# Patient Record
Sex: Female | Born: 1965 | Race: Black or African American | Hispanic: No | Smoking: Current every day smoker
Health system: Southern US, Community
[De-identification: ages and names within clinical notes are randomized; demographics above are authoritative.]

## PROBLEM LIST (undated history)

## (undated) DIAGNOSIS — J45909 Unspecified asthma, uncomplicated: Secondary | ICD-10-CM

## (undated) DIAGNOSIS — I10 Essential (primary) hypertension: Secondary | ICD-10-CM

## (undated) DIAGNOSIS — N390 Urinary tract infection, site not specified: Secondary | ICD-10-CM

## (undated) HISTORY — DX: Urinary tract infection, site not specified: N39.0

## (undated) HISTORY — DX: Essential (primary) hypertension: I10

## (undated) HISTORY — PX: ABDOMINAL HYSTERECTOMY: SHX81

---

## 2005-05-30 ENCOUNTER — Emergency Department: Payer: Self-pay | Admitting: Emergency Medicine

## 2005-05-30 ENCOUNTER — Other Ambulatory Visit: Payer: Self-pay

## 2005-05-31 ENCOUNTER — Ambulatory Visit: Payer: Self-pay | Admitting: Emergency Medicine

## 2006-07-08 ENCOUNTER — Ambulatory Visit: Payer: Self-pay | Admitting: Family Medicine

## 2007-06-23 ENCOUNTER — Emergency Department: Payer: Self-pay | Admitting: Emergency Medicine

## 2009-07-18 ENCOUNTER — Ambulatory Visit: Payer: Self-pay | Admitting: Urology

## 2009-07-18 ENCOUNTER — Ambulatory Visit: Payer: Self-pay | Admitting: Cardiology

## 2011-01-31 ENCOUNTER — Encounter: Payer: Self-pay | Admitting: Internal Medicine

## 2011-05-24 ENCOUNTER — Encounter: Payer: Self-pay | Admitting: Internal Medicine

## 2011-05-24 ENCOUNTER — Ambulatory Visit (INDEPENDENT_AMBULATORY_CARE_PROVIDER_SITE_OTHER): Payer: 59 | Admitting: Internal Medicine

## 2011-05-24 VITALS — BP 112/66 | HR 81 | Temp 98.9°F | Wt 174.0 lb

## 2011-05-24 DIAGNOSIS — L309 Dermatitis, unspecified: Secondary | ICD-10-CM

## 2011-05-24 DIAGNOSIS — J40 Bronchitis, not specified as acute or chronic: Secondary | ICD-10-CM

## 2011-05-24 DIAGNOSIS — I10 Essential (primary) hypertension: Secondary | ICD-10-CM | POA: Insufficient documentation

## 2011-05-24 DIAGNOSIS — L259 Unspecified contact dermatitis, unspecified cause: Secondary | ICD-10-CM

## 2011-05-24 MED ORDER — TRIAMTERENE-HCTZ 37.5-25 MG PO CAPS: 1.0000 | ORAL_CAPSULE | ORAL | Status: DC

## 2011-05-24 MED ORDER — PREDNISONE (PAK) 10 MG PO TABS: ORAL_TABLET | ORAL | Status: DC

## 2011-05-24 MED ORDER — AMMONIUM LACTATE 12 % EX CREA: TOPICAL_CREAM | CUTANEOUS | Status: DC | PRN

## 2011-05-24 MED ORDER — AZITHROMYCIN 250 MG PO TABS: ORAL_TABLET | ORAL | Status: AC

## 2011-05-24 MED ORDER — ALBUTEROL SULFATE HFA 108 (90 BASE) MCG/ACT IN AERS: 2.0000 | INHALATION_SPRAY | Freq: Four times a day (QID) | RESPIRATORY_TRACT | Status: DC | PRN

## 2011-05-24 MED ORDER — TRIAMCINOLONE ACETONIDE 0.025 % EX OINT: TOPICAL_OINTMENT | Freq: Two times a day (BID) | CUTANEOUS | Status: DC

## 2011-05-24 NOTE — Patient Instructions (Signed)
Use LacHydrin twice daily. Use Triamcinolone 1-2 times per day for next 3 days. Limit use as this medication can cause lightening of the skin.

## 2011-05-24 NOTE — Progress Notes (Addendum)
Subjective:    Patient ID: Jaclyn Lin, female    DOB: [birth year], 45 y.o.   MRN: [id number]  HPI 45YO female with h/o HTN presents for acute visit. She has two complaints today. First, she notes 2 week h/o peeling on the bottoms of her feet and on her palms. She was seen at urgent care and they diagnosed it as a fungal infection. They recommended topical anti-fungal cream and gave 1 day of diflucan. She had no improvement with this.  She has tried exfoliating her feet and has tried topical over-the-counter creams with no improvement. She denies any exposure to new perfumes or detergents. She denies any fever or chills. She denies any other rash.  Her second complaint today is a 2 day history of shortness of breath and cough. She notes that a coworker has been sick with cough and congestion for several weeks. She reports that she developed a cough productive of clear sputum 2 days ago. She has not been taking any medication for this. She describes some chest pain and tightness across her chest particularly with coughing. She denies any sore throat. She does have some nasal congestion which is clear. She denies any fever or chills.  In regards to her blood pressure, she reports full compliance with her education. She denies any palpitations or headache. She has had chest pain in the setting of her recent cough. She denied bring a record of her blood pressures today.  Outpatient Encounter Prescriptions as of 05/24/2011  Medication Sig Dispense Refill  . triamterene-hydrochlorothiazide (DYAZIDE) 37.5-25 MG per capsule Take 1 each (1 capsule total) by mouth every morning.  90 capsule  1  . DISCONTD: triamterene-hydrochlorothiazide (DYAZIDE) 37.5-25 MG per capsule Take 1 capsule by mouth every morning.        Marland Kitchen albuterol (PROAIR HFA) 108 (90 BASE) MCG/ACT inhaler Inhale 2 puffs into the lungs every 6 (six) hours as needed for wheezing.  18 g  3  . ammonium lactate (LAC-HYDRIN) 12 % cream Apply  topically as needed for dry skin.  385 g  0  . azithromycin (ZITHROMAX Z-PAK) 250 MG tablet Take 2 tablets (500 mg) on  Day 1,  followed by 1 tablet (250 mg) once daily on Days 2 through 5.  6 each  0  . predniSONE (STERAPRED UNI-PAK) 10 MG tablet Take 60mg  day 1 then taper by 10mg  daily  21 tablet  0  . triamcinolone (KENALOG) 0.025 % ointment Apply topically 2 (two) times daily.  30 g  0    Review of Systems  Constitutional: Negative for fever, chills, appetite change, fatigue and unexpected weight change.  HENT: Positive for congestion, sneezing and sinus pressure. Negative for ear pain, sore throat, trouble swallowing, neck pain and voice change.   Eyes: Negative for visual disturbance.  Respiratory: Positive for chest tightness, shortness of breath and wheezing. Negative for cough and stridor.   Cardiovascular: Positive for chest pain. Negative for palpitations and leg swelling.  Gastrointestinal: Negative for nausea, vomiting, abdominal pain, diarrhea, constipation, blood in stool, abdominal distention and anal bleeding.  Genitourinary: Negative for dysuria and flank pain.  Musculoskeletal: Negative for myalgias, arthralgias and gait problem.  Skin: Positive for rash. Negative for color change.  Neurological: Negative for dizziness and headaches.  Hematological: Negative for adenopathy. Does not bruise/bleed easily.  Psychiatric/Behavioral: Negative for suicidal ideas, sleep disturbance and dysphoric mood. The patient is not nervous/anxious.    BP 112/66  Pulse 81  Temp(Src) 98.9 F (37.2 C) (  Oral)  Wt 174 lb (78.926 kg)  SpO2 98%     Objective:   Physical Exam  Constitutional: She is oriented to person, place, and time. She appears well-developed and well-nourished. No distress.  HENT:  Head: Normocephalic and atraumatic.  Right Ear: External ear normal. A middle ear effusion is present.  Left Ear: External ear normal. A middle ear effusion is present.  Nose: Nose normal.    Mouth/Throat: Oropharynx is clear and moist. No oropharyngeal exudate.  Eyes: Conjunctivae are normal. Pupils are equal, round, and reactive to light. Right eye exhibits no discharge. Left eye exhibits no discharge. No scleral icterus.  Neck: Normal range of motion. Neck supple. No tracheal deviation present. No thyromegaly present.  Cardiovascular: Normal rate, regular rhythm, normal heart sounds and intact distal pulses.  Exam reveals no gallop and no friction rub.   No murmur heard. Pulmonary/Chest: Effort normal. No accessory muscle usage. Not tachypneic. No respiratory distress. She has decreased breath sounds (prolonged exp phase). She has wheezes. She has rhonchi. She has no rales. She exhibits no tenderness.  Musculoskeletal: Normal range of motion. She exhibits no edema and no tenderness.  Lymphadenopathy:    She has no cervical adenopathy.  Neurological: She is alert and oriented to person, place, and time. No cranial nerve deficit. She exhibits normal muscle tone. Coordination normal.  Skin: Skin is warm and dry. No rash noted. She is not diaphoretic. No erythema. No pallor.  Psychiatric: She has a normal mood and affect. Her behavior is normal. Judgment and thought content normal.          Assessment & Plan:  1. Bronchitis - symptoms and exam consistent with bronchitis. Will treat with ACE azithromycin and prednisone taper. She will use her albuterol inhaler as needed. She will use Tessalon as needed for cough. She will followup in one week or call sooner if symptoms are not improving. Strongly encouraged her to quit smoking.  2. Dermatitis - peeling on feet and on palms is most consistent with dyshidrotic eczema. Will add triamcinolone for 2-3 days. Will consider LacHydrin if no improvement She will call if symptoms are not improving.  3. Hypertension - blood pressure well-controlled on current medications. Will check renal function with labs prior to next visit. She will  followup in one week.

## 2011-05-28 ENCOUNTER — Telehealth: Payer: Self-pay | Admitting: Internal Medicine

## 2011-05-28 NOTE — Telephone Encounter (Signed)
Spoke w/pt - she found previous RX for cough med and will take then and keep apt Friday

## 2011-05-28 NOTE — Telephone Encounter (Signed)
[phone] Pt would like to know if you would call her in some cough medince.  She is so sore from coughing .  Pt [facility] appointment on Friday [facility] [address]

## 2011-05-31 ENCOUNTER — Encounter: Payer: Self-pay | Admitting: Internal Medicine

## 2011-05-31 ENCOUNTER — Ambulatory Visit (INDEPENDENT_AMBULATORY_CARE_PROVIDER_SITE_OTHER): Payer: 59 | Admitting: Internal Medicine

## 2011-05-31 VITALS — BP 112/72 | HR 81 | Temp 98.1°F | Wt 175.0 lb

## 2011-05-31 DIAGNOSIS — L309 Dermatitis, unspecified: Secondary | ICD-10-CM

## 2011-05-31 DIAGNOSIS — Z0000 Encounter for general adult medical examination without abnormal findings: Secondary | ICD-10-CM

## 2011-05-31 DIAGNOSIS — J40 Bronchitis, not specified as acute or chronic: Secondary | ICD-10-CM

## 2011-05-31 DIAGNOSIS — B373 Candidiasis of vulva and vagina: Secondary | ICD-10-CM

## 2011-05-31 DIAGNOSIS — B3731 Acute candidiasis of vulva and vagina: Secondary | ICD-10-CM

## 2011-05-31 DIAGNOSIS — I10 Essential (primary) hypertension: Secondary | ICD-10-CM

## 2011-05-31 DIAGNOSIS — L259 Unspecified contact dermatitis, unspecified cause: Secondary | ICD-10-CM

## 2011-05-31 MED ORDER — FLUCONAZOLE 150 MG PO TABS: 150.0000 mg | ORAL_TABLET | Freq: Once | ORAL | Status: AC

## 2011-05-31 MED ORDER — TRIAMCINOLONE ACETONIDE 0.025 % EX OINT: TOPICAL_OINTMENT | Freq: Three times a day (TID) | CUTANEOUS | Status: DC

## 2011-05-31 NOTE — Patient Instructions (Signed)
Hand Dermatitis Hand dermatitis (dyshidrotic eczema) is a skin condition in which small, itchy, raised dots or fluid-filled blisters form over the palms of the hands. Outbreaks of hand dermatitis can last 3 to 4 weeks. CAUSES  The cause of hand dermatitis is unknown. However, it occurs most often in patients with a history of allergies such as:  Hay fever.   Allergic asthma.   Allergies to latex.  Chemical exposure, injuries, and environmental irritants can make hand dermatitis worse. Washing your hands too frequently can remove natural oils, which can dry out the skin and contribute to outbreaks of hand dermatitis. SYMPTOMS  The most common symptom of hand dermatitis is intense itching. Cracks or grooves (fissures) on the fingers can also develop. Affected areas can be painful, especially areas where large blisters have formed. DIAGNOSIS Your caregiver can usually tell what the problem is by doing a physical exam. PREVENTION  Avoid excessive hand washing.   Avoid the use of harsh chemicals.   Wear protective gloves when handling products that can irritate your skin.  TREATMENT  Steroid creams and ointments, such as over-the-counter 1% hydrocortisone cream, can reduce inflammation and improve moisture retention. These should be applied at least 2 to 4 times per day. Your caregiver may ask you to use a stronger prescription steroid cream to help speed the healing of blistered and cracked skin. In severe cases, oral steroid medicine may be needed. If you have an infection, antibiotics may be needed. Your caregiver may also prescribe antihistamines. These medicines help reduce itching. HOME CARE INSTRUCTIONS  Only take over-the-counter or prescription medicines as directed by your caregiver.   You may use wet or cold compresses. This can help:   Alleviate itching.   Increase the effectiveness of topical creams.   Minimize blisters.  SEEK MEDICAL CARE IF:  The rash is not better  after 1 week of treatment.   Signs of infection develop, such as redness, tenderness, or yellowish-white fluid (pus).   The rash is spreading.  Document Released: 06/10/2005 Document Revised: 02/20/2011 Document Reviewed: 11/07/2010 [facility] Patient Information 2012 [place], [place].

## 2011-05-31 NOTE — Progress Notes (Signed)
Subjective:    Patient ID: Jaclyn Lin, female    DOB: [birth year], 45 y.o.   MRN: [id number]  HPI 45YO female with hypertension presents to follow up recent issues of rash and bronchitis. Reports that rash is improved with use of topical steroid cream.  No new areas of rash.  Notes that bronchitis also improved with steroids and antibiotics. Some intermittent mild residual cough, which is non-productive. No fever or chills.   Notes some new symptoms of vaginal itching and discharge, consistent with previous h/o yeast infection after using antibiotics.   Outpatient Encounter Prescriptions as of 05/31/2011  Medication Sig Dispense Refill  . albuterol (PROAIR HFA) 108 (90 BASE) MCG/ACT inhaler Inhale 2 puffs into the lungs every 6 (six) hours as needed for wheezing.  18 g  3  . ammonium lactate (LAC-HYDRIN) 12 % cream Apply topically as needed for dry skin.  385 g  0  . triamcinolone (KENALOG) 0.025 % ointment Apply topically 3 (three) times daily.  454 g  0  . triamterene-hydrochlorothiazide (DYAZIDE) 37.5-25 MG per capsule Take 1 each (1 capsule total) by mouth every morning.  90 capsule  1  . fluconazole (DIFLUCAN) 150 MG tablet Take 1 tablet (150 mg total) by mouth once.  2 tablet  0    Review of Systems  Constitutional: Negative for fever, chills and unexpected weight change.  HENT: Positive for congestion, rhinorrhea and postnasal drip. Negative for hearing loss, ear pain, nosebleeds, sore throat, facial swelling, sneezing, mouth sores, trouble swallowing, neck pain, neck stiffness, voice change, sinus pressure, tinnitus and ear discharge.   Eyes: Negative for pain, discharge, redness and visual disturbance.  Respiratory: Positive for cough. Negative for chest tightness, shortness of breath, wheezing and stridor.   Cardiovascular: Negative for chest pain, palpitations and leg swelling.  Musculoskeletal: Negative for myalgias and arthralgias.  Skin: Positive for rash. Negative  for color change.  Neurological: Negative for dizziness, weakness, light-headedness and headaches.  Hematological: Negative for adenopathy.   BP 112/72  Pulse 81  Temp(Src) 98.1 F (36.7 C) (Oral)  Wt 175 lb (79.379 kg)  SpO2 98%     Objective:   Physical Exam  Constitutional: She is oriented to person, place, and time. She appears well-developed and well-nourished. No distress.  HENT:  Head: Normocephalic and atraumatic.  Right Ear: External ear normal.  Left Ear: External ear normal.  Nose: Nose normal.  Mouth/Throat: Oropharynx is clear and moist. No oropharyngeal exudate.  Eyes: Conjunctivae are normal. Pupils are equal, round, and reactive to light. Right eye exhibits no discharge. Left eye exhibits no discharge. No scleral icterus.  Neck: Normal range of motion. Neck supple. No tracheal deviation present. No thyromegaly present.  Cardiovascular: Normal rate, regular rhythm, normal heart sounds and intact distal pulses.  Exam reveals no gallop and no friction rub.   No murmur heard. Pulmonary/Chest: Effort normal and breath sounds normal. No respiratory distress. She has no wheezes. She has no rales. She exhibits no tenderness.  Musculoskeletal: Normal range of motion. She exhibits no edema and no tenderness.  Lymphadenopathy:    She has no cervical adenopathy.  Neurological: She is alert and oriented to person, place, and time. No cranial nerve deficit. She exhibits normal muscle tone. Coordination normal.  Skin: Skin is warm and dry. No rash noted. She is not diaphoretic. No erythema. No pallor.  Psychiatric: She has a normal mood and affect. Her behavior is normal. Judgment and thought content normal.  Assessment & Plan:  1. Eczema - Improved with topical triamcinolone. Will continue.  2. Bronchitis - Improved after antibiotics and steroids.  Will continue to monitor.  3. Hypertension - BP well controlled on Maxide. Will check CMP and urine microalbumin with  labs today. Follow up 6 months.  4. Candidiasis - Will treat with diflucan.  5. Health maintenance - Will check CBC and lipid profile with labs today.

## 2011-06-02 LAB — COMPREHENSIVE METABOLIC PANEL
ALT: 19 IU/L (ref 0–32)
AST: 14 IU/L (ref 0–40)
Albumin/Globulin Ratio: 1.5 (ref 1.1–2.5)
Alkaline Phosphatase: 53 IU/L (ref 25–150)
BUN/Creatinine Ratio: 15 (ref 9–23)
GFR calc Af Amer: 106 mL/min/{1.73_m2} (ref >59)
GFR calc non Af Amer: 92 mL/min/{1.73_m2} (ref >59)
Potassium: 3.4 mmol/L — ABNORMAL LOW (ref 3.5–5.2)
Sodium: 138 mmol/L (ref 134–144)
Total Bilirubin: 0.2 mg/dL (ref 0.0–1.2)

## 2011-06-02 LAB — LIPID PANEL
Cholesterol, Total: 164 mg/dL (ref 100–199)
LDL Calculated: 93 mg/dL (ref 0–99)
VLDL Cholesterol Cal: 30 mg/dL (ref 5–40)

## 2011-06-03 LAB — CBC WITH DIFFERENTIAL/PLATELET
Basophils Absolute: 0.1 10*3/uL (ref 0.0–0.2)
Basos: 1 % (ref 0–3)
Eosinophils Absolute: 0 10*3/uL (ref 0.0–0.4)
Immature Grans (Abs): 0 10*3/uL (ref 0.0–0.1)
Lymphs: 40 % (ref 14–46)
MCH: 27.6 pg (ref 26.6–33.0)
Neutrophils Relative %: 48 % (ref 40–74)
RBC: 4.64 x10E6/uL (ref 3.77–5.28)
WBC: 7.1 10*3/uL (ref 4.0–10.5)

## 2011-06-04 ENCOUNTER — Encounter: Payer: Self-pay | Admitting: *Deleted

## 2011-08-27 ENCOUNTER — Encounter: Payer: Self-pay | Admitting: Internal Medicine

## 2011-08-28 ENCOUNTER — Ambulatory Visit (INDEPENDENT_AMBULATORY_CARE_PROVIDER_SITE_OTHER): Payer: 59 | Admitting: Internal Medicine

## 2011-08-28 ENCOUNTER — Encounter: Payer: Self-pay | Admitting: Internal Medicine

## 2011-08-28 VITALS — BP 122/78 | HR 102 | Temp 98.5°F | Ht 66.0 in | Wt 174.0 lb

## 2011-08-28 DIAGNOSIS — B3731 Acute candidiasis of vulva and vagina: Secondary | ICD-10-CM

## 2011-08-28 DIAGNOSIS — R1032 Left lower quadrant pain: Secondary | ICD-10-CM

## 2011-08-28 DIAGNOSIS — B373 Candidiasis of vulva and vagina: Secondary | ICD-10-CM

## 2011-08-28 DIAGNOSIS — R110 Nausea: Secondary | ICD-10-CM | POA: Insufficient documentation

## 2011-08-28 DIAGNOSIS — I10 Essential (primary) hypertension: Secondary | ICD-10-CM

## 2011-08-28 LAB — COMPREHENSIVE METABOLIC PANEL
ALT: 10 U/L (ref 0–35)
AST: 13 U/L (ref 0–37)
Albumin: 4.4 g/dL (ref 3.5–5.2)
Alkaline Phosphatase: 51 U/L (ref 39–117)
Potassium: 3.7 mEq/L (ref 3.5–5.3)
Sodium: 139 mEq/L (ref 135–145)
Total Bilirubin: 0.3 mg/dL (ref 0.3–1.2)
Total Protein: 7.2 g/dL (ref 6.0–8.3)

## 2011-08-28 LAB — POCT URINALYSIS DIPSTICK
Bilirubin, UA: NEGATIVE
Blood, UA: NEGATIVE
Glucose, UA: NEGATIVE
Leukocytes, UA: NEGATIVE
Nitrite, UA: NEGATIVE
Urobilinogen, UA: 0.2

## 2011-08-28 MED ORDER — TRIAMTERENE-HCTZ 37.5-25 MG PO CAPS: 1.0000 | ORAL_CAPSULE | Freq: Every day | ORAL | Status: DC

## 2011-08-28 MED ORDER — FLUCONAZOLE 150 MG PO TABS: 150.0000 mg | ORAL_TABLET | Freq: Once | ORAL | Status: AC

## 2011-08-28 NOTE — Assessment & Plan Note (Signed)
Question ovarian cyst, as this has been a problem for pt in the past. Will get pelvic US.

## 2011-08-28 NOTE — Assessment & Plan Note (Signed)
Will treat with diflucan x 1.

## 2011-08-28 NOTE — Assessment & Plan Note (Signed)
BP well controlled today. Will check renal function with labs. Follow up 6 months.

## 2011-08-28 NOTE — Progress Notes (Signed)
Subjective:    Patient ID: Jaclyn Lin, female    DOB: 05-18-1966, 46 y.o.   MRN: [id number]  HPI 46 YO female with history of hypertension presents for acute visit complaining of several week history of nausea. She denies any vomiting. She denies any fever, chills, upper, pain. She denies any diarrhea or constipation. She is concerned that her potassium may be low bleeding to nausea. She does report some left lower quadrant pain over the last few weeks. She notes that she was previously followed by her GYN for left-sided ovarian cyst. She has not had a recent followup with this. The pain in her left lower abdomen is described as constant and aching. She has not noticed any fullness or mass in that area.  Outpatient Encounter Prescriptions as of 08/28/2011  Medication Sig Dispense Refill  . albuterol (PROAIR HFA) 108 (90 BASE) MCG/ACT inhaler Inhale 2 puffs into the lungs every 6 (six) hours as needed for wheezing.  18 g  3  . ammonium lactate (LAC-HYDRIN) 12 % cream Apply topically as needed for dry skin.  385 g  0  . triamcinolone (KENALOG) 0.025 % ointment Apply topically 3 (three) times daily.  454 g  0  . triamterene-hydrochlorothiazide (DYAZIDE) 37.5-25 MG per capsule Take 1 each (1 capsule total) by mouth daily.  90 capsule  1  . DISCONTD: triamterene-hydrochlorothiazide (DYAZIDE) 37.5-25 MG per capsule Take 1 each (1 capsule total) by mouth every morning.  90 capsule  1  . fluconazole (DIFLUCAN) 150 MG tablet Take 1 tablet (150 mg total) by mouth once.  1 tablet  0    Review of Systems  Constitutional: Negative for fever, chills, appetite change, fatigue and unexpected weight change.  HENT: Negative for ear pain, congestion, sore throat, trouble swallowing, neck pain, voice change and sinus pressure.   Eyes: Negative for visual disturbance.  Respiratory: Negative for cough, shortness of breath, wheezing and stridor.   Cardiovascular: Negative for chest pain, palpitations and leg  swelling.  Gastrointestinal: Positive for nausea. Negative for vomiting, abdominal pain, diarrhea, constipation, blood in stool, abdominal distention and anal bleeding.  Genitourinary: Positive for pelvic pain. Negative for dysuria, urgency, frequency, flank pain, vaginal bleeding, vaginal discharge, difficulty urinating and vaginal pain.  Musculoskeletal: Negative for myalgias, arthralgias and gait problem.  Skin: Negative for color change and rash.  Neurological: Negative for dizziness and headaches.  Hematological: Negative for adenopathy. Does not bruise/bleed easily.  Psychiatric/Behavioral: Negative for suicidal ideas, sleep disturbance and dysphoric mood. The patient is not nervous/anxious.    BP 122/78  Pulse 102  Temp(Src) 98.5 F (36.9 C) (Oral)  Ht 5\' 6"  (1.676 m)  Wt 174 lb (78.926 kg)  BMI 28.08 kg/m2  SpO2 97%     Objective:   Physical Exam  Constitutional: She is oriented to person, place, and time. She appears well-developed and well-nourished. No distress.  HENT:  Head: Normocephalic and atraumatic.  Right Ear: External ear normal.  Left Ear: External ear normal.  Nose: Nose normal.  Mouth/Throat: Oropharynx is clear and moist. No oropharyngeal exudate.  Eyes: Conjunctivae are normal. Pupils are equal, round, and reactive to light. Right eye exhibits no discharge. Left eye exhibits no discharge. No scleral icterus.  Neck: Normal range of motion. Neck supple. No tracheal deviation present. No thyromegaly present.  Cardiovascular: Normal rate, regular rhythm, normal heart sounds and intact distal pulses.  Exam reveals no gallop and no friction rub.   No murmur heard. Pulmonary/Chest: Effort normal and breath  sounds normal. No respiratory distress. She has no wheezes. She has no rales. She exhibits no tenderness.  Abdominal: She exhibits no distension and no mass. There is tenderness (left lower quadrant, no mass palpated). There is no rebound and no guarding.    Musculoskeletal: Normal range of motion. She exhibits no edema and no tenderness.  Lymphadenopathy:    She has no cervical adenopathy.  Neurological: She is alert and oriented to person, place, and time. No cranial nerve deficit. She exhibits normal muscle tone. Coordination normal.  Skin: Skin is warm and dry. No rash noted. She is not diaphoretic. No erythema. No pallor.  Psychiatric: She has a normal mood and affect. Her behavior is normal. Judgment and thought content normal.          Assessment & Plan:

## 2011-08-28 NOTE — Assessment & Plan Note (Signed)
Suspect viral etiology. Will check CMP and H.Pylori with labs.  Will also check urinalysis.  Will get pelvic US given LLQ pain.  Will call pt with results.

## 2011-08-29 ENCOUNTER — Other Ambulatory Visit: Payer: Self-pay | Admitting: *Deleted

## 2011-08-29 LAB — HELICOBACTER PYLORI  ANTIBODY, IGM: Helicobacter pylori, IgM: 25.9 U/mL — ABNORMAL HIGH (ref <9.0)

## 2011-08-29 MED ORDER — AMOXICILL-CLARITHRO-LANSOPRAZ PO MISC: Freq: Two times a day (BID) | ORAL | Status: AC

## 2011-08-30 ENCOUNTER — Telehealth: Payer: Self-pay | Admitting: *Deleted

## 2011-08-30 ENCOUNTER — Encounter: Payer: Self-pay | Admitting: Internal Medicine

## 2011-08-30 MED ORDER — AMOXICILLIN 500 MG PO CAPS: 1000.0000 mg | ORAL_CAPSULE | Freq: Two times a day (BID) | ORAL | Status: AC

## 2011-08-30 MED ORDER — OMEPRAZOLE 40 MG PO CPDR: 40.0000 mg | DELAYED_RELEASE_CAPSULE | Freq: Two times a day (BID) | ORAL | Status: DC

## 2011-08-30 MED ORDER — CLARITHROMYCIN 500 MG PO TABS: 500.0000 mg | ORAL_TABLET | Freq: Two times a day (BID) | ORAL | Status: AC

## 2011-08-30 NOTE — Telephone Encounter (Signed)
Alt to Prevpac sent in, see last mychart message

## 2011-09-02 ENCOUNTER — Encounter: Payer: Self-pay | Admitting: Internal Medicine

## 2011-09-09 ENCOUNTER — Ambulatory Visit: Payer: 59 | Admitting: Internal Medicine

## 2011-09-11 ENCOUNTER — Telehealth: Payer: Self-pay | Admitting: Internal Medicine

## 2011-09-11 MED ORDER — FLUCONAZOLE 150 MG PO TABS: 150.0000 mg | ORAL_TABLET | Freq: Once | ORAL | Status: AC

## 2011-09-11 NOTE — Telephone Encounter (Signed)
Fine to call in diflucan 150mg  po x1

## 2011-09-11 NOTE — Telephone Encounter (Signed)
Patient informed, RX sent in  

## 2011-09-11 NOTE — Telephone Encounter (Signed)
Patient has been on a lot of antibiotics and now she is needing something for yeast can something be called into the pharmacy.

## 2011-09-26 ENCOUNTER — Ambulatory Visit (INDEPENDENT_AMBULATORY_CARE_PROVIDER_SITE_OTHER): Payer: 59 | Admitting: Internal Medicine

## 2011-09-26 ENCOUNTER — Encounter: Payer: Self-pay | Admitting: Internal Medicine

## 2011-09-26 VITALS — BP 102/70 | HR 85 | Temp 98.4°F | Ht 66.0 in | Wt 173.0 lb

## 2011-09-26 DIAGNOSIS — N760 Acute vaginitis: Secondary | ICD-10-CM

## 2011-09-26 MED ORDER — FLUCONAZOLE 150 MG PO TABS: 150.0000 mg | ORAL_TABLET | Freq: Once | ORAL | Status: AC

## 2011-09-26 NOTE — Assessment & Plan Note (Signed)
Candidal vaginitis after being treated with antibiotics for H. Pylori. Will treat with diflucan x 3 days, given frequent recurrence in past.  Follow up prn.

## 2011-09-26 NOTE — Progress Notes (Signed)
  Subjective:    Patient ID: Jaclyn Lin, female    DOB: [birth year], 46 y.o.   MRN: [id number]  HPI 46YO female presents for acute visit c/o vaginal itching x several days after being treated with multiple antibiotics for H. Pylori infection. Denies pelvic pain, fever, chills. Notes white vaginal discharge. No new sexual partners. No dysuria.  Outpatient Encounter Prescriptions as of 09/26/2011  Medication Sig Dispense Refill  . albuterol (PROAIR HFA) 108 (90 BASE) MCG/ACT inhaler Inhale 2 puffs into the lungs every 6 (six) hours as needed for wheezing.  18 g  3  . ammonium lactate (LAC-HYDRIN) 12 % cream Apply topically as needed for dry skin.  385 g  0  . amoxicillin-clarithromycin-lansoprazole (PREVPAC) combo pack Take by mouth 2 (two) times daily. Follow package directions.  1 kit  0  . fluconazole (DIFLUCAN) 150 MG tablet Take 1 tablet (150 mg total) by mouth once.  3 tablet  0  . omeprazole (PRILOSEC) 40 MG capsule Take 1 capsule (40 mg total) by mouth 2 (two) times daily.  20 capsule  0  . triamcinolone (KENALOG) 0.025 % ointment Apply topically 3 (three) times daily.  454 g  0  . triamterene-hydrochlorothiazide (DYAZIDE) 37.5-25 MG per capsule Take 1 each (1 capsule total) by mouth daily.  90 capsule  1    Review of Systems  Constitutional: Negative for fever, chills and fatigue.  Gastrointestinal: Negative for abdominal pain.  Genitourinary: Positive for vaginal discharge. Negative for dysuria, urgency, flank pain, vaginal pain and pelvic pain.   BP 102/70  Pulse 85  Temp(Src) 98.4 F (36.9 C) (Oral)  Ht 5\' 6"  (1.676 m)  Wt 173 lb (78.472 kg)  BMI 27.92 kg/m2  SpO2 100%     Objective:   Physical Exam  Constitutional: She appears well-developed and well-nourished. No distress.  HENT:  Head: Normocephalic and atraumatic.  Eyes: EOM are normal. Pupils are equal, round, and reactive to light.  Neck: Normal range of motion. Neck supple.  Pulmonary/Chest: Effort  normal.  Genitourinary: There is no rash on the right labia. There is no rash on the left labia. Vaginal discharge (white, thick) found.  Skin: She is not diaphoretic.          Assessment & Plan:

## 2011-10-07 ENCOUNTER — Telehealth: Payer: Self-pay | Admitting: Internal Medicine

## 2011-10-07 NOTE — Telephone Encounter (Signed)
FYI  I was verify that pt went to there ultra sound appointment that you ordered 08/28/11.  Per [facility] pt canceled appointment.

## 2012-03-11 ENCOUNTER — Telehealth: Payer: Self-pay | Admitting: Internal Medicine

## 2012-03-11 NOTE — Telephone Encounter (Signed)
Caller: [name]/Patient; Patient Name: Jaclyn Lin; PCP: Ronna Polio (Adults only); Best Callback Phone Number: [phone]; Reason for call: Cough/Congestion. Onset 03/07/12. Afebrile. Pt having chest tightness and productive cough. Denies wheezing.  All emergent symptoms ruled out per Cough protocol with exception to 'Productive cough with colored sputum'.  See Provider in 24 hrs.  Appt scheduled 03/12/12 @ 1130 with Provider. Home care advice given.

## 2012-03-12 ENCOUNTER — Ambulatory Visit (INDEPENDENT_AMBULATORY_CARE_PROVIDER_SITE_OTHER): Payer: 59 | Admitting: Internal Medicine

## 2012-03-12 ENCOUNTER — Encounter: Payer: Self-pay | Admitting: Internal Medicine

## 2012-03-12 VITALS — BP 120/80 | HR 68 | Temp 98.6°F | Ht 66.0 in | Wt 165.5 lb

## 2012-03-12 DIAGNOSIS — Z720 Tobacco use: Secondary | ICD-10-CM | POA: Insufficient documentation

## 2012-03-12 DIAGNOSIS — F17200 Nicotine dependence, unspecified, uncomplicated: Secondary | ICD-10-CM

## 2012-03-12 DIAGNOSIS — J40 Bronchitis, not specified as acute or chronic: Secondary | ICD-10-CM

## 2012-03-12 MED ORDER — HYDROCOD POLST-CHLORPHEN POLST 10-8 MG/5ML PO LQCR: 5.0000 mL | Freq: Two times a day (BID) | ORAL | Status: DC | PRN

## 2012-03-12 MED ORDER — PREDNISONE (PAK) 10 MG PO TABS: ORAL_TABLET | ORAL | Status: DC

## 2012-03-12 MED ORDER — AZITHROMYCIN 250 MG PO TABS: ORAL_TABLET | ORAL | Status: DC

## 2012-03-12 MED ORDER — ALBUTEROL SULFATE HFA 108 (90 BASE) MCG/ACT IN AERS: 2.0000 | INHALATION_SPRAY | Freq: Four times a day (QID) | RESPIRATORY_TRACT | Status: DC | PRN

## 2012-03-12 NOTE — Assessment & Plan Note (Signed)
Strongly encouraged smoking cessation. 

## 2012-03-12 NOTE — Assessment & Plan Note (Signed)
Symptoms and exam consistent with bronchitis. Will treat with azithromycin, prednisone taper, inhaled albuterol. Will use Tussionex for cough. Pt will call if symptoms not improving over next 72hr.

## 2012-03-12 NOTE — Progress Notes (Signed)
Subjective:    Patient ID: Jaclyn Lin, female    DOB: 1965/08/17, 46 y.o.   MRN: [id number]  HPI 46 year old female with history of tobacco use presents for acute visit complaining of one-week history of cough which has been nonproductive, shortness of breath, wheezing. Symptoms first began the last week. She has been taking over-the-counter Coricidin with no improvement. She denies any fever, chills, chest pain. She has been using albuterol as directed with minimal improvement.  Outpatient Encounter Prescriptions as of 03/12/2012  Medication Sig Dispense Refill  . triamterene-hydrochlorothiazide (DYAZIDE) 37.5-25 MG per capsule Take 1 each (1 capsule total) by mouth daily.  90 capsule  1  . albuterol (PROAIR HFA) 108 (90 BASE) MCG/ACT inhaler Inhale 2 puffs into the lungs every 6 (six) hours as needed for wheezing.  18 g  3  . azithromycin (ZITHROMAX Z-PAK) 250 MG tablet Take 2 pills day 1, then 1 pill daily days 2-5  6 each  0  . chlorpheniramine-HYDROcodone (TUSSIONEX) 10-8 MG/5ML LQCR Take 5 mLs by mouth every 12 (twelve) hours as needed.  140 mL  0  . predniSONE (STERAPRED UNI-PAK) 10 MG tablet Take 60mg  day 1 then taper by 10mg  daily  21 tablet  0   BP 120/80  Pulse 68  Temp 98.6 F (37 C) (Oral)  Ht 5\' 6"  (1.676 m)  Wt 165 lb 8 oz (75.07 kg)  BMI 26.71 kg/m2  SpO2 97%  Review of Systems  Constitutional: Positive for fatigue. Negative for fever, chills and unexpected weight change.  HENT: Negative for hearing loss, ear pain, nosebleeds, congestion, sore throat, facial swelling, rhinorrhea, sneezing, mouth sores, trouble swallowing, neck pain, neck stiffness, voice change, postnasal drip, sinus pressure, tinnitus and ear discharge.   Eyes: Negative for pain, discharge, redness and visual disturbance.  Respiratory: Positive for cough, chest tightness, shortness of breath and wheezing. Negative for stridor.   Cardiovascular: Negative for chest pain, palpitations and leg  swelling.  Musculoskeletal: Negative for myalgias and arthralgias.  Skin: Negative for color change and rash.  Neurological: Negative for dizziness, weakness, light-headedness and headaches.  Hematological: Negative for adenopathy.       Objective:   Physical Exam  Constitutional: She is oriented to person, place, and time. She appears well-developed and well-nourished. No distress.  HENT:  Head: Normocephalic and atraumatic.  Right Ear: External ear normal.  Left Ear: External ear normal.  Nose: Nose normal.  Mouth/Throat: Oropharynx is clear and moist. No oropharyngeal exudate.  Eyes: Conjunctivae normal are normal. Pupils are equal, round, and reactive to light. Right eye exhibits no discharge. Left eye exhibits no discharge. No scleral icterus.  Neck: Normal range of motion. Neck supple. No tracheal deviation present. No thyromegaly present.  Cardiovascular: Normal rate, regular rhythm, normal heart sounds and intact distal pulses.  Exam reveals no gallop and no friction rub.   No murmur heard. Pulmonary/Chest: Effort normal. No accessory muscle usage. Not tachypneic. No respiratory distress. She has decreased breath sounds. She has wheezes. She has no rhonchi. She has no rales. She exhibits no tenderness.  Musculoskeletal: Normal range of motion. She exhibits no edema and no tenderness.  Lymphadenopathy:    She has no cervical adenopathy.  Neurological: She is alert and oriented to person, place, and time. No cranial nerve deficit. She exhibits normal muscle tone. Coordination normal.  Skin: Skin is warm and dry. No rash noted. She is not diaphoretic. No erythema. No pallor.  Psychiatric: She has a normal mood  and affect. Her behavior is normal. Judgment and thought content normal.          Assessment & Plan:

## 2012-03-15 ENCOUNTER — Encounter: Payer: Self-pay | Admitting: Internal Medicine

## 2012-03-16 MED ORDER — FLUCONAZOLE 150 MG PO TABS: 150.0000 mg | ORAL_TABLET | Freq: Once | ORAL | Status: DC

## 2012-04-22 ENCOUNTER — Encounter: Payer: Self-pay | Admitting: Internal Medicine

## 2012-04-29 ENCOUNTER — Other Ambulatory Visit: Payer: Self-pay | Admitting: Internal Medicine

## 2012-04-29 ENCOUNTER — Ambulatory Visit (INDEPENDENT_AMBULATORY_CARE_PROVIDER_SITE_OTHER): Payer: 59 | Admitting: Internal Medicine

## 2012-04-29 ENCOUNTER — Ambulatory Visit (INDEPENDENT_AMBULATORY_CARE_PROVIDER_SITE_OTHER)
Admission: RE | Admit: 2012-04-29 | Discharge: 2012-04-29 | Disposition: A | Discharge status: Home / Self Care | Payer: 59 | Source: Ambulatory Visit | Attending: Internal Medicine | Admitting: Internal Medicine

## 2012-04-29 ENCOUNTER — Encounter: Payer: Self-pay | Admitting: Internal Medicine

## 2012-04-29 VITALS — BP 112/72 | HR 89 | Temp 98.8°F | Ht 66.0 in | Wt 158.5 lb

## 2012-04-29 DIAGNOSIS — F411 Generalized anxiety disorder: Secondary | ICD-10-CM

## 2012-04-29 DIAGNOSIS — R05 Cough: Secondary | ICD-10-CM

## 2012-04-29 DIAGNOSIS — R634 Abnormal weight loss: Secondary | ICD-10-CM

## 2012-04-29 DIAGNOSIS — J40 Bronchitis, not specified as acute or chronic: Secondary | ICD-10-CM

## 2012-04-29 DIAGNOSIS — F419 Anxiety disorder, unspecified: Secondary | ICD-10-CM

## 2012-04-29 DIAGNOSIS — R059 Cough, unspecified: Secondary | ICD-10-CM

## 2012-04-29 MED ORDER — ALPRAZOLAM 0.25 MG PO TABS: 0.2500 mg | ORAL_TABLET | Freq: Two times a day (BID) | ORAL | Status: DC | PRN

## 2012-04-29 MED ORDER — HYDROCOD POLST-CHLORPHEN POLST 10-8 MG/5ML PO LQCR: 5.0000 mL | Freq: Two times a day (BID) | ORAL | Status: DC | PRN

## 2012-04-29 NOTE — Assessment & Plan Note (Signed)
Patient with two-month history of cough. Exam is normal. Chest x-ray is normal. Question if this may be related to allergies or acid reflux versus prolonged cough after viral infection. No other symptoms consistent with allergic rhinitis. Patient does have a history of H. pylori which may be exacerbating acid reflux symptoms, so will check that this was eliminated with antibiotic therapy with breath test. Will use Tussionex as needed at night. Followup 4 weeks.

## 2012-04-29 NOTE — Assessment & Plan Note (Signed)
Patient with ongoing weight loss, suspect related to increased anxiety in the setting of son's murder trial. Will start Xanax as above. Will also check for recurrent H. pylori infection with breath test. If symptoms are persisting, would favor checking labs including CBC, TSH, and CMP.

## 2012-04-29 NOTE — Progress Notes (Signed)
Subjective:    Patient ID: Jaclyn Lin, female    DOB: [birth year], 46 y.o.   MRN: [id number]  HPI 46 year old female presents for acute visit complaining of persistent symptoms of cough, particularly at night. Patient was first seen in September 2013 and diagnosed with bronchitis. She was treated with antibiotics but reports no improvement in her symptoms of cough. She describes dry cough which is most prominent at night. She denies any chest pain, shortness of breath, fever, chills. She is also noted some decrease in her appetite with occasional nausea. Of note, she was diagnosed with H. pylori infection the past but has not yet had repeat testing to confirm cure after antibiotic therapy. She denies any abdominal pain, change in bowel habits. She has had some weight loss, approximately 10 pounds in the last 2 months. She has had significant increased stress in her life with repeat trial of her son accused of murderer.  Outpatient Encounter Prescriptions as of 04/29/2012  Medication Sig Dispense Refill  . albuterol (PROAIR HFA) 108 (90 BASE) MCG/ACT inhaler Inhale 2 puffs into the lungs every 6 (six) hours as needed for wheezing.  18 g  3  . triamterene-hydrochlorothiazide (DYAZIDE) 37.5-25 MG per capsule Take 1 each (1 capsule total) by mouth daily.  90 capsule  1  . ALPRAZolam (XANAX) 0.25 MG tablet Take 1 tablet (0.25 mg total) by mouth 2 (two) times daily as needed for sleep or anxiety.  90 tablet  3  . chlorpheniramine-HYDROcodone (TUSSIONEX) 10-8 MG/5ML LQCR Take 5 mLs by mouth every 12 (twelve) hours as needed.  140 mL  0   BP 112/72  Pulse 89  Temp 98.8 F (37.1 C) (Oral)  Ht 5\' 6"  (1.676 m)  Wt 158 lb 8 oz (71.895 kg)  BMI 25.58 kg/m2  SpO2 97%  Review of Systems  Constitutional: Positive for unexpected weight change. Negative for fever, chills, appetite change and fatigue.  HENT: Negative for ear pain, congestion, sore throat, trouble swallowing, neck pain, voice change  and sinus pressure.   Eyes: Negative for visual disturbance.  Respiratory: Positive for cough. Negative for shortness of breath, wheezing and stridor.   Cardiovascular: Negative for chest pain, palpitations and leg swelling.  Gastrointestinal: Positive for nausea. Negative for vomiting, abdominal pain, diarrhea, constipation, blood in stool, abdominal distention and anal bleeding.  Genitourinary: Negative for dysuria and flank pain.  Musculoskeletal: Negative for myalgias, arthralgias and gait problem.  Skin: Negative for color change and rash.  Neurological: Negative for dizziness and headaches.  Hematological: Negative for adenopathy. Does not bruise/bleed easily.  Psychiatric/Behavioral: Negative for suicidal ideas, sleep disturbance and dysphoric mood. The patient is not nervous/anxious.        Objective:   Physical Exam  Constitutional: She is oriented to person, place, and time. She appears well-developed and well-nourished. No distress.  HENT:  Head: Normocephalic and atraumatic.  Right Ear: External ear normal.  Left Ear: External ear normal.  Nose: Nose normal.  Mouth/Throat: Oropharynx is clear and moist. No oropharyngeal exudate.  Eyes: Conjunctivae normal are normal. Pupils are equal, round, and reactive to light. Right eye exhibits no discharge. Left eye exhibits no discharge. No scleral icterus.  Neck: Normal range of motion. Neck supple. No tracheal deviation present. No thyromegaly present.  Cardiovascular: Normal rate, regular rhythm, normal heart sounds and intact distal pulses.  Exam reveals no gallop and no friction rub.   No murmur heard. Pulmonary/Chest: Effort normal and breath sounds normal. No respiratory distress. She has  no wheezes. She has no rales. She exhibits no tenderness.  Abdominal: Soft. Bowel sounds are normal. She exhibits no distension. There is no tenderness.  Musculoskeletal: Normal range of motion. She exhibits no edema and no tenderness.    Lymphadenopathy:    She has no cervical adenopathy.  Neurological: She is alert and oriented to person, place, and time. No cranial nerve deficit. She exhibits normal muscle tone. Coordination normal.  Skin: Skin is warm and dry. No rash noted. She is not diaphoretic. No erythema. No pallor.  Psychiatric: She has a normal mood and affect. Her behavior is normal. Judgment and thought content normal.          Assessment & Plan:

## 2012-04-29 NOTE — Assessment & Plan Note (Signed)
Symptoms of anxiety worsened with ongoing issues with her son's murder trial. Offered support today. Will start Xanax as needed for severe episodes of panic or anxiety. Followup one week.

## 2012-05-07 ENCOUNTER — Encounter: Payer: Self-pay | Admitting: Internal Medicine

## 2012-05-07 MED ORDER — PERMETHRIN 5 % EX CREA: TOPICAL_CREAM | Freq: Once | CUTANEOUS | Status: DC

## 2012-06-16 ENCOUNTER — Encounter: Payer: Self-pay | Admitting: Internal Medicine

## 2012-06-16 ENCOUNTER — Ambulatory Visit (INDEPENDENT_AMBULATORY_CARE_PROVIDER_SITE_OTHER): Payer: 59 | Admitting: Internal Medicine

## 2012-06-16 ENCOUNTER — Telehealth: Payer: Self-pay | Admitting: *Deleted

## 2012-06-16 VITALS — BP 112/72 | HR 80 | Temp 98.3°F | Resp 16 | Wt 165.0 lb

## 2012-06-16 DIAGNOSIS — F419 Anxiety disorder, unspecified: Secondary | ICD-10-CM

## 2012-06-16 DIAGNOSIS — I10 Essential (primary) hypertension: Secondary | ICD-10-CM

## 2012-06-16 DIAGNOSIS — R05 Cough: Secondary | ICD-10-CM

## 2012-06-16 DIAGNOSIS — Z1331 Encounter for screening for depression: Secondary | ICD-10-CM

## 2012-06-16 DIAGNOSIS — R350 Frequency of micturition: Secondary | ICD-10-CM

## 2012-06-16 DIAGNOSIS — N390 Urinary tract infection, site not specified: Secondary | ICD-10-CM

## 2012-06-16 DIAGNOSIS — F411 Generalized anxiety disorder: Secondary | ICD-10-CM

## 2012-06-16 DIAGNOSIS — Z0000 Encounter for general adult medical examination without abnormal findings: Secondary | ICD-10-CM

## 2012-06-16 DIAGNOSIS — R059 Cough, unspecified: Secondary | ICD-10-CM

## 2012-06-16 DIAGNOSIS — B3731 Acute candidiasis of vulva and vagina: Secondary | ICD-10-CM | POA: Insufficient documentation

## 2012-06-16 DIAGNOSIS — IMO0001 Reserved for inherently not codable concepts without codable children: Secondary | ICD-10-CM

## 2012-06-16 DIAGNOSIS — B373 Candidiasis of vulva and vagina: Secondary | ICD-10-CM

## 2012-06-16 LAB — POCT URINALYSIS DIPSTICK
Nitrite, UA: NEGATIVE
Urobilinogen, UA: 0.2
pH, UA: 6

## 2012-06-16 LAB — CBC WITH DIFFERENTIAL/PLATELET
Basophils Absolute: 0.1 10*3/uL (ref 0.0–0.1)
Eosinophils Absolute: 0.1 10*3/uL (ref 0.0–0.7)
Lymphocytes Relative: 29.1 % (ref 12.0–46.0)
MCHC: 33.1 g/dL (ref 30.0–36.0)
Monocytes Absolute: 0.5 10*3/uL (ref 0.1–1.0)
Neutro Abs: 5.1 10*3/uL (ref 1.4–7.7)
Neutrophils Relative %: 62.5 % (ref 43.0–77.0)
RDW: 15 % — ABNORMAL HIGH (ref 11.5–14.6)

## 2012-06-16 LAB — COMPREHENSIVE METABOLIC PANEL
ALT: 12 U/L (ref 0–35)
AST: 17 U/L (ref 0–37)
Alkaline Phosphatase: 48 U/L (ref 39–117)
Calcium: 9 mg/dL (ref 8.4–10.5)
Chloride: 102 mEq/L (ref 96–112)
Creatinine, Ser: 0.7 mg/dL (ref 0.4–1.2)

## 2012-06-16 LAB — TSH: TSH: 1.5 u[IU]/mL (ref 0.35–5.50)

## 2012-06-16 LAB — LIPID PANEL
HDL: 45.6 mg/dL (ref >39.00)
LDL Cholesterol: 88 mg/dL (ref 0–99)
Total CHOL/HDL Ratio: 3
VLDL: 18 mg/dL (ref 0.0–40.0)

## 2012-06-16 MED ORDER — TRIAMTERENE-HCTZ 37.5-25 MG PO CAPS: 1.0000 | ORAL_CAPSULE | Freq: Every day | ORAL | Status: DC

## 2012-06-16 MED ORDER — ALPRAZOLAM 0.25 MG PO TABS: 0.2500 mg | ORAL_TABLET | Freq: Two times a day (BID) | ORAL | Status: DC | PRN

## 2012-06-16 MED ORDER — CIPROFLOXACIN HCL 500 MG PO TABS: 500.0000 mg | ORAL_TABLET | Freq: Two times a day (BID) | ORAL | Status: DC

## 2012-06-16 MED ORDER — FLUCONAZOLE 150 MG PO TABS: 150.0000 mg | ORAL_TABLET | Freq: Every day | ORAL | Status: DC

## 2012-06-16 NOTE — Progress Notes (Signed)
  Subjective:    Patient ID: Jaclyn Lin, female    DOB: [birth year], 46 y.o.   MRN: [id number]  HPI 46YO female with h/o hypertension, anxiety presents for follow up. At last visit complaining of cough over 4 weeks. She reports that symptoms of cough have improved. Chest x-ray was normal. She denies any fever, chills, shortness of breath. Next  She is concerned today about several day history of increased urinary frequency, urgency, and dysuria. She denies fever, chills, flank pain. She has not taken any medication for this.  She is also concerned today about vaginal yeast infection. She reports white vaginal discharge and itching consistent with her previous history of yeast infections. This is been ongoing for several days as well. She denies fever, chills, pelvic pain. She has not taken any medication for this.  She also notes that she was recently laid off from her job. Her insurance coverage will be ending January 3.  Outpatient Encounter Prescriptions as of 06/16/2012  Medication Sig Dispense Refill  . albuterol (PROAIR HFA) 108 (90 BASE) MCG/ACT inhaler Inhale 2 puffs into the lungs every 6 (six) hours as needed for wheezing.  18 g  3  . ALPRAZolam (XANAX) 0.25 MG tablet Take 1 tablet (0.25 mg total) by mouth 2 (two) times daily as needed for sleep or anxiety.  90 tablet  3  . triamterene-hydrochlorothiazide (DYAZIDE) 37.5-25 MG per capsule Take 1 each (1 capsule total) by mouth daily.  90 capsule  4    Review of Systems  Constitutional: Negative for fever, chills and fatigue.  Respiratory: Positive for cough.   Gastrointestinal: Negative for nausea, vomiting, abdominal pain, diarrhea, constipation and rectal pain.  Genitourinary: Positive for dysuria, urgency, frequency and vaginal discharge. Negative for hematuria, flank pain, decreased urine volume, vaginal bleeding, difficulty urinating, vaginal pain and pelvic pain.   BP 112/72  Pulse 80  Temp 98.3 F (36.8 C) (Oral)   Resp 16  Wt 165 lb (74.844 kg)     Objective:   Physical Exam  Constitutional: She is oriented to person, place, and time. She appears well-developed and well-nourished. No distress.  HENT:  Head: Normocephalic and atraumatic.  Right Ear: External ear normal.  Left Ear: External ear normal.  Nose: Nose normal.  Mouth/Throat: Oropharynx is clear and moist. No oropharyngeal exudate.  Eyes: Conjunctivae normal are normal. Pupils are equal, round, and reactive to light. Right eye exhibits no discharge. Left eye exhibits no discharge. No scleral icterus.  Neck: Normal range of motion. Neck supple. No tracheal deviation present. No thyromegaly present.  Cardiovascular: Normal rate, regular rhythm, normal heart sounds and intact distal pulses.  Exam reveals no gallop and no friction rub.   No murmur heard. Pulmonary/Chest: Effort normal and breath sounds normal. No respiratory distress. She has no wheezes. She has no rales. She exhibits no tenderness.  Abdominal: There is no tenderness.  Musculoskeletal: Normal range of motion. She exhibits no edema and no tenderness.  Lymphadenopathy:    She has no cervical adenopathy.  Neurological: She is alert and oriented to person, place, and time. No cranial nerve deficit. She exhibits normal muscle tone. Coordination normal.  Skin: Skin is warm and dry. No rash noted. She is not diaphoretic. No erythema. No pallor.  Psychiatric: She has a normal mood and affect. Her behavior is normal. Judgment and thought content normal.          Assessment & Plan:

## 2012-06-16 NOTE — Assessment & Plan Note (Signed)
Cough has gradually improved. Suspect symptoms were related to viral infection. CXR was normal. Will continue to monitor.

## 2012-06-16 NOTE — Assessment & Plan Note (Signed)
Symptoms most consistent with vaginal candidiasis, which will likely be exacerbated with use of Cipro. Will start Diflucan today and plan to repeat at end of Cipro course. Pt will call if symptoms not improving.

## 2012-06-16 NOTE — Assessment & Plan Note (Signed)
Symptoms and urinalysis most consistent with UTI.  Will send urine for culture. Will start empiric Cipro.  Pt will increase fluid intake. She will call if symptoms not improving over next 24-48hr.

## 2012-06-18 LAB — URINE CULTURE: Organism ID, Bacteria: NO GROWTH

## 2012-06-19 ENCOUNTER — Other Ambulatory Visit (HOSPITAL_COMMUNITY)
Admission: RE | Admit: 2012-06-19 | Discharge: 2012-06-19 | Disposition: A | Discharge status: Home / Self Care | Payer: 59 | Source: Ambulatory Visit | Attending: Internal Medicine | Admitting: Internal Medicine

## 2012-06-19 ENCOUNTER — Telehealth: Payer: Self-pay | Admitting: Internal Medicine

## 2012-06-19 ENCOUNTER — Ambulatory Visit (INDEPENDENT_AMBULATORY_CARE_PROVIDER_SITE_OTHER): Payer: 59 | Admitting: Internal Medicine

## 2012-06-19 ENCOUNTER — Encounter: Payer: Self-pay | Admitting: Internal Medicine

## 2012-06-19 VITALS — BP 120/80 | HR 91 | Temp 98.3°F | Resp 16 | Ht 66.5 in | Wt 162.2 lb

## 2012-06-19 DIAGNOSIS — A5901 Trichomonal vulvovaginitis: Secondary | ICD-10-CM

## 2012-06-19 DIAGNOSIS — J40 Bronchitis, not specified as acute or chronic: Secondary | ICD-10-CM

## 2012-06-19 DIAGNOSIS — N760 Acute vaginitis: Secondary | ICD-10-CM | POA: Insufficient documentation

## 2012-06-19 DIAGNOSIS — Z113 Encounter for screening for infections with a predominantly sexual mode of transmission: Secondary | ICD-10-CM | POA: Insufficient documentation

## 2012-06-19 DIAGNOSIS — R300 Dysuria: Secondary | ICD-10-CM

## 2012-06-19 DIAGNOSIS — Z0000 Encounter for general adult medical examination without abnormal findings: Secondary | ICD-10-CM | POA: Insufficient documentation

## 2012-06-19 DIAGNOSIS — R053 Chronic cough: Secondary | ICD-10-CM

## 2012-06-19 DIAGNOSIS — F17200 Nicotine dependence, unspecified, uncomplicated: Secondary | ICD-10-CM

## 2012-06-19 DIAGNOSIS — Z720 Tobacco use: Secondary | ICD-10-CM

## 2012-06-19 DIAGNOSIS — R059 Cough, unspecified: Secondary | ICD-10-CM

## 2012-06-19 DIAGNOSIS — I10 Essential (primary) hypertension: Secondary | ICD-10-CM

## 2012-06-19 DIAGNOSIS — R05 Cough: Secondary | ICD-10-CM

## 2012-06-19 LAB — POCT URINALYSIS DIPSTICK
Bilirubin, UA: NEGATIVE
Glucose, UA: NEGATIVE
Ketones, UA: NEGATIVE
Spec Grav, UA: 1.02

## 2012-06-19 MED ORDER — ALBUTEROL SULFATE HFA 108 (90 BASE) MCG/ACT IN AERS: 2.0000 | INHALATION_SPRAY | Freq: Four times a day (QID) | RESPIRATORY_TRACT | Status: DC | PRN

## 2012-06-19 MED ORDER — HYDROCOD POLST-CHLORPHEN POLST 10-8 MG/5ML PO LQCR: 5.0000 mL | Freq: Two times a day (BID) | ORAL | Status: DC | PRN

## 2012-06-19 MED ORDER — OMEPRAZOLE 40 MG PO CPDR: 40.0000 mg | DELAYED_RELEASE_CAPSULE | Freq: Every day | ORAL | Status: DC

## 2012-06-19 NOTE — Assessment & Plan Note (Addendum)
Persistent dry cough x several months. Worse at night. CXR normal. No improvement with albuterol. Will add omeprazole to see if any improvement, as may be related to chronic GERD. Will also set up pulmonary evaluation. Question if PFTs might be helpful.

## 2012-06-19 NOTE — Progress Notes (Signed)
Subjective:    Patient ID: Jaclyn Lin, female    DOB: 03/09/1966, 46 y.o.   MRN: [id number]  HPI 46 year old female presents for annual exam. Over the last couple of months, she has had persistent dry cough. She reports that this is ongoing. The cough is worse at night. She has been taking some Tussionex at night with improvement in symptoms. She denies any fever, chills, chest pain, shortness of breath. Recent chest x-ray was normal. She is a smoker. She has been using albuterol with no improvement.  She is also concerned about some persistent vaginal discharge and external genital irritation. At her last visit, she was noted to have blood and leukocytes in her urine. She was treated with Cipro and urine was sent for culture. Urine culture was negative. She reports that symptoms are relatively unchanged. She denies any pelvic pain, fever, chills.  Outpatient Encounter Prescriptions as of 06/19/2012  Medication Sig Dispense Refill  . albuterol (PROAIR HFA) 108 (90 BASE) MCG/ACT inhaler Inhale 2 puffs into the lungs every 6 (six) hours as needed for wheezing.  18 g  3  . ALPRAZolam (XANAX) 0.25 MG tablet Take 1 tablet (0.25 mg total) by mouth 2 (two) times daily as needed for sleep or anxiety.  90 tablet  3  . triamterene-hydrochlorothiazide (DYAZIDE) 37.5-25 MG per capsule Take 1 each (1 capsule total) by mouth daily.  90 capsule  4  . chlorpheniramine-HYDROcodone (TUSSIONEX) 10-8 MG/5ML LQCR Take 5 mLs by mouth every 12 (twelve) hours as needed.  140 mL  0  . omeprazole (PRILOSEC) 40 MG capsule Take 1 capsule (40 mg total) by mouth daily.  30 capsule  3   BP 120/80  Pulse 91  Temp 98.3 F (36.8 C) (Oral)  Resp 16  Ht 5' 6.5" (1.689 m)  Wt 162 lb 4 oz (73.596 kg)  BMI 25.80 kg/m2  SpO2 96%  Review of Systems  Constitutional: Negative for fever, chills, appetite change, fatigue and unexpected weight change.  HENT: Negative for ear pain, congestion, sore throat, trouble  swallowing, neck pain, voice change and sinus pressure.   Eyes: Negative for visual disturbance.  Respiratory: Positive for cough. Negative for shortness of breath, wheezing and stridor.   Cardiovascular: Negative for chest pain, palpitations and leg swelling.  Gastrointestinal: Negative for nausea, vomiting, abdominal pain, diarrhea, constipation, blood in stool, abdominal distention and anal bleeding.  Genitourinary: Positive for dysuria, vaginal discharge and vaginal pain (external). Negative for urgency, frequency, flank pain, genital sores, pelvic pain and dyspareunia.  Musculoskeletal: Negative for myalgias, arthralgias and gait problem.  Skin: Negative for color change and rash.  Neurological: Negative for dizziness and headaches.  Hematological: Negative for adenopathy. Does not bruise/bleed easily.  Psychiatric/Behavioral: Negative for suicidal ideas, sleep disturbance and dysphoric mood. The patient is not nervous/anxious.        Objective:   Physical Exam  Constitutional: She is oriented to person, place, and time. She appears well-developed and well-nourished. No distress.  HENT:  Head: Normocephalic and atraumatic.  Right Ear: External ear normal.  Left Ear: External ear normal.  Nose: Nose normal.  Mouth/Throat: Oropharynx is clear and moist. No oropharyngeal exudate.  Eyes: Conjunctivae normal are normal. Pupils are equal, round, and reactive to light. Right eye exhibits no discharge. Left eye exhibits no discharge. No scleral icterus.  Neck: Normal range of motion. Neck supple. No tracheal deviation present. No thyromegaly present.  Cardiovascular: Normal rate, regular rhythm, normal heart sounds and intact distal pulses.  Exam reveals no gallop and no friction rub.   No murmur heard. Pulmonary/Chest: Effort normal and breath sounds normal. No accessory muscle usage. Not tachypneic. No respiratory distress. She has no decreased breath sounds. She has no wheezes. She has no  rhonchi. She has no rales. She exhibits no tenderness.  Abdominal: Soft. Bowel sounds are normal. She exhibits no distension and no mass. There is no tenderness. There is no rebound and no guarding.  Genitourinary:    No breast swelling, tenderness, discharge or bleeding. No labial fusion. There is tenderness on the right labia. There is no rash, lesion or injury on the right labia. There is no rash, tenderness, lesion or injury on the left labia. Cervix exhibits discharge (thick, purulent). Cervix exhibits no motion tenderness and no friability. Right adnexum displays no mass, no tenderness and no fullness. Left adnexum displays no mass, no tenderness and no fullness. No erythema, tenderness or bleeding around the vagina. No foreign body around the vagina. No signs of injury around the vagina. Vaginal discharge found.  Musculoskeletal: Normal range of motion. She exhibits no edema and no tenderness.  Lymphadenopathy:    She has no cervical adenopathy.  Neurological: She is alert and oriented to person, place, and time. No cranial nerve deficit. She exhibits normal muscle tone. Coordination normal.  Skin: Skin is warm and dry. No rash noted. She is not diaphoretic. No erythema. No pallor.  Psychiatric: She has a normal mood and affect. Her behavior is normal. Judgment and thought content normal.          Assessment & Plan:

## 2012-06-19 NOTE — Telephone Encounter (Signed)
Follow up.

## 2012-06-19 NOTE — Assessment & Plan Note (Signed)
Gen med exam normal today including breast and pelvic exam. PAP pending. Mammogram scheduled. Encouraged smoking cessation. Will set up pulmonary evaluation given persistent cough. Reviewed recent labs including CBC, CMP, Lipids which were excellent.

## 2012-06-19 NOTE — Assessment & Plan Note (Signed)
BP well controlled on current medications. Will continue. Recent renal function normal.

## 2012-06-19 NOTE — Assessment & Plan Note (Signed)
Persistent symptoms despite treatment with cipro. Urine culture from 12/24 was negative. Will repeat UA and culture today. Will also send STD testing with PAP.

## 2012-06-19 NOTE — Assessment & Plan Note (Signed)
Strongly encouraged smoking cessation. Briefly discussed options including Wellbutrin, Chantix and nicotine replacement.

## 2012-06-21 ENCOUNTER — Encounter: Payer: Self-pay | Admitting: Internal Medicine

## 2012-06-21 DIAGNOSIS — R319 Hematuria, unspecified: Secondary | ICD-10-CM

## 2012-06-21 LAB — URINE CULTURE
Colony Count: NO GROWTH
Organism ID, Bacteria: NO GROWTH

## 2012-06-22 ENCOUNTER — Telehealth: Payer: Self-pay | Admitting: *Deleted

## 2012-06-22 ENCOUNTER — Other Ambulatory Visit: Payer: Self-pay | Admitting: Internal Medicine

## 2012-06-22 ENCOUNTER — Other Ambulatory Visit (INDEPENDENT_AMBULATORY_CARE_PROVIDER_SITE_OTHER): Payer: 59

## 2012-06-22 DIAGNOSIS — N390 Urinary tract infection, site not specified: Secondary | ICD-10-CM

## 2012-06-22 DIAGNOSIS — R319 Hematuria, unspecified: Secondary | ICD-10-CM

## 2012-06-22 LAB — POCT URINALYSIS DIPSTICK
Glucose, UA: NEGATIVE
Nitrite, UA: POSITIVE
Protein, UA: NEGATIVE
Urobilinogen, UA: 0.2

## 2012-06-22 NOTE — Telephone Encounter (Signed)
Would you like a urine culture done? Thank you 

## 2012-06-22 NOTE — Telephone Encounter (Signed)
Yes, please.

## 2012-06-23 ENCOUNTER — Other Ambulatory Visit (HOSPITAL_COMMUNITY)
Admission: RE | Admit: 2012-06-23 | Discharge: 2012-06-23 | Disposition: A | Discharge status: Home / Self Care | Payer: 59 | Source: Ambulatory Visit | Attending: Internal Medicine | Admitting: Internal Medicine

## 2012-06-23 ENCOUNTER — Encounter: Payer: Self-pay | Admitting: Internal Medicine

## 2012-06-23 DIAGNOSIS — N760 Acute vaginitis: Secondary | ICD-10-CM | POA: Insufficient documentation

## 2012-06-23 MED ORDER — METRONIDAZOLE 500 MG PO TABS: 2000.0000 mg | ORAL_TABLET | Freq: Once | ORAL | Status: DC

## 2012-06-23 NOTE — Addendum Note (Signed)
Addended by: Ronna Polio A on: 06/23/2012 03:18 PM   Modules accepted: Orders

## 2012-06-24 LAB — URINE CULTURE
Colony Count: NO GROWTH
Organism ID, Bacteria: NO GROWTH

## 2012-06-26 ENCOUNTER — Other Ambulatory Visit (INDEPENDENT_AMBULATORY_CARE_PROVIDER_SITE_OTHER): Payer: 59

## 2012-06-26 ENCOUNTER — Telehealth: Payer: Self-pay | Admitting: *Deleted

## 2012-06-26 DIAGNOSIS — N390 Urinary tract infection, site not specified: Secondary | ICD-10-CM

## 2012-06-26 LAB — POCT URINALYSIS DIPSTICK
Glucose, UA: NEGATIVE
Nitrite, UA: NEGATIVE
Spec Grav, UA: 1.015
Urobilinogen, UA: 0.2
pH, UA: 6

## 2012-06-26 NOTE — Telephone Encounter (Signed)
No. Just Cytology.

## 2012-06-26 NOTE — Telephone Encounter (Signed)
For this pt would you like a urine culture done? Thank you

## 2012-06-29 MED ORDER — METRONIDAZOLE 500 MG PO TABS: 500.0000 mg | ORAL_TABLET | Freq: Two times a day (BID) | ORAL | Status: DC

## 2012-06-29 NOTE — Addendum Note (Signed)
Addended by: Ronna Polio A on: 06/29/2012 07:27 PM   Modules accepted: Orders

## 2012-06-30 MED ORDER — FLUCONAZOLE 150 MG PO TABS: 150.0000 mg | ORAL_TABLET | Freq: Once | ORAL | Status: DC

## 2012-07-07 ENCOUNTER — Encounter: Payer: Self-pay | Admitting: Internal Medicine

## 2012-07-07 ENCOUNTER — Ambulatory Visit: Payer: 59 | Admitting: Pulmonary Disease

## 2012-07-07 NOTE — Progress Notes (Signed)
No show

## 2012-07-09 ENCOUNTER — Encounter: Payer: Self-pay | Admitting: Internal Medicine

## 2012-07-09 ENCOUNTER — Telehealth: Payer: Self-pay | Admitting: *Deleted

## 2012-07-09 ENCOUNTER — Other Ambulatory Visit (INDEPENDENT_AMBULATORY_CARE_PROVIDER_SITE_OTHER): Payer: 59

## 2012-07-09 DIAGNOSIS — A599 Trichomoniasis, unspecified: Secondary | ICD-10-CM

## 2012-07-09 DIAGNOSIS — N390 Urinary tract infection, site not specified: Secondary | ICD-10-CM

## 2012-07-09 LAB — POCT URINALYSIS DIPSTICK
Glucose, UA: 100
Ketones, UA: NEGATIVE
Spec Grav, UA: 1.015
Urobilinogen, UA: 0.2

## 2012-07-09 NOTE — Telephone Encounter (Signed)
Would you like a urine culture done? Thank you

## 2012-07-09 NOTE — Telephone Encounter (Signed)
Sure

## 2012-07-11 LAB — URINE CULTURE
Colony Count: NO GROWTH
Organism ID, Bacteria: NO GROWTH

## 2012-07-13 ENCOUNTER — Encounter: Payer: Self-pay | Admitting: Internal Medicine

## 2012-07-15 ENCOUNTER — Other Ambulatory Visit (HOSPITAL_COMMUNITY)
Admission: RE | Admit: 2012-07-15 | Discharge: 2012-07-15 | Disposition: A | Discharge status: Home / Self Care | Payer: 59 | Source: Ambulatory Visit | Attending: Internal Medicine | Admitting: Internal Medicine

## 2012-07-15 DIAGNOSIS — N760 Acute vaginitis: Secondary | ICD-10-CM | POA: Insufficient documentation

## 2012-07-15 NOTE — Telephone Encounter (Signed)
Open in error

## 2012-07-17 ENCOUNTER — Other Ambulatory Visit: Payer: 59

## 2012-07-17 ENCOUNTER — Encounter: Payer: Self-pay | Admitting: Internal Medicine

## 2012-07-20 ENCOUNTER — Ambulatory Visit (INDEPENDENT_AMBULATORY_CARE_PROVIDER_SITE_OTHER): Payer: 59 | Admitting: Pulmonary Disease

## 2012-07-20 ENCOUNTER — Encounter: Payer: Self-pay | Admitting: Pulmonary Disease

## 2012-07-20 VITALS — BP 116/80 | HR 87 | Temp 98.6°F | Ht 66.0 in | Wt 166.0 lb

## 2012-07-20 DIAGNOSIS — R05 Cough: Secondary | ICD-10-CM

## 2012-07-20 DIAGNOSIS — R059 Cough, unspecified: Secondary | ICD-10-CM

## 2012-07-20 DIAGNOSIS — F17200 Nicotine dependence, unspecified, uncomplicated: Secondary | ICD-10-CM

## 2012-07-20 DIAGNOSIS — R053 Chronic cough: Secondary | ICD-10-CM

## 2012-07-20 DIAGNOSIS — Z720 Tobacco use: Secondary | ICD-10-CM

## 2012-07-20 NOTE — Progress Notes (Signed)
Subjective:    Patient ID: Jaclyn Lin, female    DOB: 1966/03/25, 47 y.o.   MRN: [id number]  HPI  This is a very pleasant 47 year old female smoker who comes to our clinic today for evaluation of cough. She states that the cough is gone on for over a year and occurs mostly at night. Please note at the time of this evaluation the patient states that the cough is completely resolved. She was sent to [place] for evaluation for possible asthma as an etiology of the cough. 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 The cough occurred at night when she was in bed and was rarely productive of scant, clear sputum. This is also associated with heartburn. She had rare shortness of breath at the time of the cough and stated that use of an albuterol inhaler helped with the shortness of breath and a cough. As a child she had asthma but does not remember ever using an inhaler or being hospitalized. Unfortunately around the age of 75 she started smoking cigarettes and continues to smoke one half pack of cigarettes daily. She states that she has smoked as much as one pack of cigarettes daily. 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 In the last several weeks she has started using Prilosec for heartburn and this has controlled her heartburn and has been associated with the rapid decline in her cough. 3 weeks prior to this visit she also was laid off from her job and she has not coughed since. She denies sinus symptoms. 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   Past Medical History  Diagnosis Date  . Hypertension   . UTI (lower urinary tract infection)      Family History  Problem Relation Age of Onset   . Hypertension Mother   . Cancer Maternal Grandfather   . Asthma Maternal Grandmother   . Asthma Son      History   Social History  . Marital Status: Married    Spouse Name: N/A    Number of Children: N/A  . Years of Education: N/A   Occupational History  . Not on file.   Social History Main Topics  . Smoking status: Current Every Day Smoker -- 0.5 packs/day for 20 years  . Smokeless tobacco: Not on file  . Alcohol Use: Yes  . Drug Use: Not on file  . Sexually Active: Not on file   Other Topics Concern  . Not on file   Social History Narrative  . No narrative on file     Allergies  Allergen Reactions  . Codeine Itching     Outpatient Prescriptions Prior to Visit  Medication Sig Dispense Refill  . albuterol (PROAIR HFA) 108 (90 BASE) MCG/ACT inhaler Inhale 2 puffs into the lungs every 6 (six) hours as needed for wheezing.  18 g  3  . ALPRAZolam (XANAX) 0.25 MG tablet Take 1 tablet (0.25 mg total) by mouth 2 (two) times daily as needed for sleep or anxiety.  90 tablet  3  . chlorpheniramine-HYDROcodone (TUSSIONEX) 10-8 MG/5ML LQCR Take 5 mLs by mouth every 12 (twelve) hours as needed.  140 mL  0  .  omeprazole (PRILOSEC) 40 MG capsule Take 1 capsule (40 mg total) by mouth daily.  30 capsule  3  . triamterene-hydrochlorothiazide (DYAZIDE) 37.5-25 MG per capsule Take 1 each (1 capsule total) by mouth daily.  90 capsule  4  . [DISCONTINUED] fluconazole (DIFLUCAN) 150 MG tablet Take 1 tablet (150 mg total) by mouth once.  1 tablet  0  . [DISCONTINUED] metroNIDAZOLE (FLAGYL) 500 MG tablet Take 4 tablets (2,000 mg total) by mouth once.  4 tablet  0  . [DISCONTINUED] metroNIDAZOLE (FLAGYL) 500 MG tablet Take 1 tablet (500 mg total) by mouth 2 (two) times daily.  14 tablet  0   Last reviewed on 07/20/2012 10:40 AM by Caryl Ada, CMA   Review of Systems  Constitutional: Negative for fever and unexpected weight change.  HENT: Negative for ear pain, nosebleeds,  congestion, sore throat, rhinorrhea, sneezing, trouble swallowing, dental problem, postnasal drip and sinus pressure.   Eyes: Negative for redness and itching.  Respiratory: Positive for cough. Negative for chest tightness, shortness of breath and wheezing.   Cardiovascular: Negative for palpitations and leg swelling.  Gastrointestinal: Negative for nausea and vomiting.  Genitourinary: Negative for dysuria.  Musculoskeletal: Negative for joint swelling.  Skin: Negative for rash.  Neurological: Negative for headaches.  Hematological: Does not bruise/bleed easily.  Psychiatric/Behavioral: Negative for dysphoric mood. The patient is not nervous/anxious.        Objective:   Physical Exam  Filed Vitals:   07/20/12 1039  BP: 116/80  Pulse: 87  Temp: 98.6 F (37 C)  TempSrc: Oral  Height: 5\' 6"  (1.676 m)  Weight: 166 lb (75.297 kg)  SpO2: 98%   Gen: well appearing, no acute distress HEENT: NCAT, PERRL, EOMi, OP clear, neck supple without masses PULM: CTA B CV: RRR, no mgr, no JVD AB: BS+, soft, nontender, no hsm Ext: warm, no edema, no clubbing, no cyanosis Derm: no rash or skin breakdown Neuro: A&Ox4, CN II-XII intact, strength 5/5 in all 4 extremities  July 20 2012 simple spirometry normal November 2013 chest x-ray without acute abnormality     Assessment & Plan:   Cough, persistent Fortunately Jaclyn Lin's cough seems to have improved greatly after no longer working at [employer] and having her GERD control improve on prilosec.  Based on this I think that it is likely that her cough is due to reflux and perhaps some component of asthma which was exacerbated by an exposure at work.  She had asthma as a child and notes that her cough improved after albuterol use.  She states that other coworkers in her area suffered from frequent cough as well, so perhaps there was an exposure at play there.    Regardless her cough has nearly completely resolved and doesn't bother her much  anymore.  Clearly the ongoing tobacco use is contributing some as well, see below.  Plan: -continue prilosec -continue prn albuterol -see tobacco abuse. -if her cough does not resolve after quitting smoking, consider metacholine challenge to evaluate for asthma  Tobacco use We discussed this at length today. She understands that the ongoing tobaco use is contributing to her cough.  She is willing to try to quit smoking after an upcoming legal situation is resolved.  We advised her to call the [place] QUITLINE to get nicotine replacement for free from the state of [place].   Updated Medication List Outpatient Encounter Prescriptions as of 07/20/2012  Medication Sig Dispense Refill  . albuterol (PROAIR HFA) 108 (90 BASE) MCG/ACT inhaler  Inhale 2 puffs into the lungs every 6 (six) hours as needed for wheezing.  18 g  3  . ALPRAZolam (XANAX) 0.25 MG tablet Take 1 tablet (0.25 mg total) by mouth 2 (two) times daily as needed for sleep or anxiety.  90 tablet  3  . chlorpheniramine-HYDROcodone (TUSSIONEX) 10-8 MG/5ML LQCR Take 5 mLs by mouth every 12 (twelve) hours as needed.  140 mL  0  . omeprazole (PRILOSEC) 40 MG capsule Take 1 capsule (40 mg total) by mouth daily.  30 capsule  3  . triamterene-hydrochlorothiazide (DYAZIDE) 37.5-25 MG per capsule Take 1 each (1 capsule total) by mouth daily.  90 capsule  4  . [DISCONTINUED] fluconazole (DIFLUCAN) 150 MG tablet Take 1 tablet (150 mg total) by mouth once.  1 tablet  0  . [DISCONTINUED] metroNIDAZOLE (FLAGYL) 500 MG tablet Take 4 tablets (2,000 mg total) by mouth once.  4 tablet  0  . [DISCONTINUED] metroNIDAZOLE (FLAGYL) 500 MG tablet Take 1 tablet (500 mg total) by mouth 2 (two) times daily.  14 tablet  0

## 2012-07-20 NOTE — Assessment & Plan Note (Signed)
We discussed this at length today. She understands that the ongoing tobaco use is contributing to her cough.  She is willing to try to quit smoking after an upcoming legal situation is resolved.  We advised her to call the [place] QUITLINE to get nicotine replacement for free from the state of [place].

## 2012-07-20 NOTE — Patient Instructions (Addendum)
Call 1-800-QUIT-NOW to get nicotine patches from the state of [place] for free.  We recommend that you use of nicotine daily for one month, then cut back to 7 mcg daily after that. Keep taking your prilosec as you are doing We will see you back as needed or if the cough comes back.

## 2012-07-20 NOTE — Assessment & Plan Note (Addendum)
Fortunately Jaclyn Lin's cough seems to have improved greatly after no longer working at [employer] and having her GERD control improve on prilosec.  Based on this I think that it is likely that her cough is due to reflux and perhaps some component of asthma which was exacerbated by an exposure at work.  She had asthma as a child and notes that her cough improved after albuterol use.  She states that other coworkers in her area suffered from frequent cough as well, so perhaps there was an exposure at play there.    Regardless her cough has nearly completely resolved and doesn't bother her much anymore.  Clearly the ongoing tobacco use is contributing some as well, see below.  Plan: -continue prilosec -continue prn albuterol -see tobacco abuse. -if her cough does not resolve after quitting smoking, consider metacholine challenge to evaluate for asthma

## 2012-07-21 ENCOUNTER — Encounter: Payer: Self-pay | Admitting: Pulmonary Disease

## 2012-07-21 LAB — HM MAMMOGRAPHY: HM MAMMO: NORMAL

## 2013-04-29 ENCOUNTER — Other Ambulatory Visit: Payer: Self-pay

## 2013-05-13 ENCOUNTER — Encounter: Payer: Self-pay | Admitting: Adult Health

## 2013-05-13 ENCOUNTER — Ambulatory Visit (INDEPENDENT_AMBULATORY_CARE_PROVIDER_SITE_OTHER): Payer: Self-pay | Admitting: Adult Health

## 2013-05-13 VITALS — BP 110/62 | HR 74 | Temp 98.8°F | Resp 12 | Ht 66.0 in | Wt 171.5 lb

## 2013-05-13 DIAGNOSIS — J329 Chronic sinusitis, unspecified: Secondary | ICD-10-CM

## 2013-05-13 DIAGNOSIS — J309 Allergic rhinitis, unspecified: Secondary | ICD-10-CM | POA: Insufficient documentation

## 2013-05-13 MED ORDER — FLUTICASONE PROPIONATE 50 MCG/ACT NA SUSP: 2.0000 | Freq: Every day | NASAL | Status: DC

## 2013-05-13 MED ORDER — FLUCONAZOLE 150 MG PO TABS: 150.0000 mg | ORAL_TABLET | Freq: Once | ORAL | Status: DC

## 2013-05-13 MED ORDER — AMOXICILLIN-POT CLAVULANATE 875-125 MG PO TABS: 1.0000 | ORAL_TABLET | Freq: Two times a day (BID) | ORAL | Status: DC

## 2013-05-13 MED ORDER — HYDROCOD POLST-CHLORPHEN POLST 10-8 MG/5ML PO LQCR: 5.0000 mL | Freq: Two times a day (BID) | ORAL | Status: DC | PRN

## 2013-05-13 NOTE — Assessment & Plan Note (Signed)
Augmentin bid x 10 days. Tussionex for cough. Flonase nasal spray

## 2013-05-13 NOTE — Patient Instructions (Signed)
  Take Augmentin twice a day for 10 days.  I am prescribing Diflucan for yeast infection. Take 1 tablet if you develop symptoms. If symptoms persist after 3 days then take another tablet.  Flonase nasal spray - 2 sprays into each nostril daily.

## 2013-05-13 NOTE — Progress Notes (Signed)
  Subjective:    Patient ID: Jaclyn Lin, female    DOB: 03/13/1966, 47 y.o.   MRN: [id number]  HPI  Patient is a pleasant 47 y/o female who presents with cough, yellow-green phlegm and post nasal drip ongoing x 1.5 weeks. She has taken robitussin and musinex. She reports it helped somewhat. The cough is keeping her up at night.  Current Outpatient Prescriptions on File Prior to Visit  Medication Sig Dispense Refill  . albuterol (PROAIR HFA) 108 (90 BASE) MCG/ACT inhaler Inhale 2 puffs into the lungs every 6 (six) hours as needed for wheezing.  18 g  3  . ALPRAZolam (XANAX) 0.25 MG tablet Take 1 tablet (0.25 mg total) by mouth 2 (two) times daily as needed for sleep or anxiety.  90 tablet  3  . omeprazole (PRILOSEC) 40 MG capsule Take 1 capsule (40 mg total) by mouth daily.  30 capsule  3  . triamterene-hydrochlorothiazide (DYAZIDE) 37.5-25 MG per capsule Take 1 each (1 capsule total) by mouth daily.  90 capsule  4   No current facility-administered medications on file prior to visit.     Review of Systems  Constitutional: Negative for fever and chills.  HENT: Positive for congestion, postnasal drip, sinus pressure and sore throat.        Pressure in ears  Respiratory: Positive for cough and wheezing.   Cardiovascular: Negative.        Objective:   Physical Exam  Constitutional: She is oriented to person, place, and time. No distress.  HENT:  Head: Normocephalic and atraumatic.  Bilateral ear canal erythema. Pharyngeal erythema. Drainage noted posterior pharynx.  Cardiovascular: Normal rate, regular rhythm and normal heart sounds.  Exam reveals no gallop.   No murmur heard. Pulmonary/Chest: Effort normal and breath sounds normal. No respiratory distress. She has no wheezes. She has no rales.  Neurological: She is alert and oriented to person, place, and time.  Psychiatric: She has a normal mood and affect. Her behavior is normal. Judgment and thought content normal.     BP 110/62  Pulse 74  Temp(Src) 98.8 F (37.1 C) (Oral)  Resp 12  Ht 5\' 6"  (1.676 m)  Wt 171 lb 8 oz (77.792 kg)  BMI 27.69 kg/m2  SpO2 97%       Assessment & Plan:

## 2013-05-13 NOTE — Progress Notes (Signed)
Pre visit review using our clinic review tool, if applicable. No additional management support is needed unless otherwise documented below in the visit note. 

## 2013-05-31 ENCOUNTER — Other Ambulatory Visit: Payer: Self-pay | Admitting: Internal Medicine

## 2013-05-31 DIAGNOSIS — F419 Anxiety disorder, unspecified: Secondary | ICD-10-CM

## 2013-05-31 MED ORDER — ALPRAZOLAM 0.25 MG PO TABS: 0.2500 mg | ORAL_TABLET | Freq: Three times a day (TID) | ORAL | Status: DC | PRN

## 2013-06-22 ENCOUNTER — Other Ambulatory Visit: Payer: Self-pay | Admitting: Internal Medicine

## 2013-09-22 ENCOUNTER — Other Ambulatory Visit: Payer: Self-pay | Admitting: Internal Medicine

## 2013-09-29 ENCOUNTER — Other Ambulatory Visit: Payer: Self-pay | Admitting: Internal Medicine

## 2013-09-29 MED ORDER — TRIAMTERENE-HCTZ 37.5-25 MG PO CAPS: ORAL_CAPSULE | ORAL | Status: DC

## 2013-10-04 NOTE — Telephone Encounter (Signed)
Mailed unread message to pt  

## 2013-11-08 ENCOUNTER — Ambulatory Visit (INDEPENDENT_AMBULATORY_CARE_PROVIDER_SITE_OTHER): Payer: 59 | Admitting: Internal Medicine

## 2013-11-08 ENCOUNTER — Encounter: Payer: Self-pay | Admitting: Internal Medicine

## 2013-11-08 VITALS — BP 110/80 | HR 86 | Temp 98.3°F | Ht 66.0 in | Wt 167.8 lb

## 2013-11-08 DIAGNOSIS — F419 Anxiety disorder, unspecified: Secondary | ICD-10-CM

## 2013-11-08 DIAGNOSIS — F411 Generalized anxiety disorder: Secondary | ICD-10-CM

## 2013-11-08 DIAGNOSIS — Z0000 Encounter for general adult medical examination without abnormal findings: Secondary | ICD-10-CM

## 2013-11-08 DIAGNOSIS — I10 Essential (primary) hypertension: Secondary | ICD-10-CM

## 2013-11-08 MED ORDER — ALPRAZOLAM 0.25 MG PO TABS: 0.2500 mg | ORAL_TABLET | Freq: Three times a day (TID) | ORAL | Status: DC | PRN

## 2013-11-08 MED ORDER — TRIAMTERENE-HCTZ 37.5-25 MG PO CAPS: ORAL_CAPSULE | ORAL | Status: DC

## 2013-11-08 NOTE — Assessment & Plan Note (Signed)
Significant increase in anxiety recently with ongoing trial. Offered support today. Will continue prn alprazolam, which she feels is working well. She will call or email if she feels that she needs additional assistance.

## 2013-11-08 NOTE — Progress Notes (Signed)
Pre visit review using our clinic review tool, if applicable. No additional management support is needed unless otherwise documented below in the visit note. 

## 2013-11-08 NOTE — Progress Notes (Signed)
Subjective:    Patient ID: Jaclyn Lin, female    DOB: 12/22/1965, 48 y.o.   MRN: [id number]  HPI 48YO female presents for follow up. Very difficult time for her. She is going through repeat trial related to her son's murder. Feels that she is coping well. Using Alprazolam on occasion for severe anxiety with improvement. 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 Not sleeping much.  Has not recently checked BP. Occasional chest pain during recent trial, but this is rare, lasts very brief time and resolves without intervention. No other new concerns today.  Review of Systems  Constitutional: Negative for fever, chills, appetite change, fatigue and unexpected weight change.  HENT: Negative for congestion, ear pain, sinus pressure, sore throat, trouble swallowing and voice change.   Eyes: Negative for visual disturbance.  Respiratory: Negative for cough, shortness of breath, wheezing and stridor.   Cardiovascular: Negative for chest pain, palpitations and leg swelling.  Gastrointestinal: Negative for nausea, vomiting, abdominal pain, diarrhea, constipation, blood in stool, abdominal distention and anal bleeding.  Genitourinary: Negative for dysuria and flank pain.  Musculoskeletal: Negative for arthralgias, gait problem, myalgias and neck pain.  Skin: Negative for color change and rash.  Neurological: Negative for dizziness and headaches.  Hematological: Negative for adenopathy. Does not bruise/bleed easily.  Psychiatric/Behavioral: Positive for sleep disturbance. Negative for suicidal ideas and dysphoric mood. The patient is nervous/anxious.        Objective:    BP 110/80  Pulse 86  Temp(Src) 98.3 F (36.8 C) (Oral)  Ht 5\' 6"  (1.676 m)  Wt 167 lb 12 oz (76.091 kg)  BMI 27.09 kg/m2  SpO2 97% Physical Exam  Constitutional: She is oriented to person, place, and time. She appears well-developed and well-nourished. No distress.  HENT:  Head: Normocephalic and  atraumatic.  Right Ear: External ear normal.  Left Ear: External ear normal.  Nose: Nose normal.  Mouth/Throat: Oropharynx is clear and moist. No oropharyngeal exudate.  Eyes: Conjunctivae are normal. Pupils are equal, round, and reactive to light. Right eye exhibits no discharge. Left eye exhibits no discharge. No scleral icterus.  Neck: Normal range of motion. Neck supple. No tracheal deviation present. No thyromegaly present.  Cardiovascular: Normal rate, regular rhythm, normal heart sounds and intact distal pulses.  Exam reveals no gallop and no friction rub.   No murmur heard. Pulmonary/Chest: Effort normal and breath sounds normal. No accessory muscle usage. Not tachypneic. No respiratory distress. She has no decreased breath sounds. She has no wheezes. She has no rhonchi. She has no rales. She exhibits no tenderness.  Musculoskeletal: Normal range of motion. She exhibits no edema and no tenderness.  Lymphadenopathy:    She has no cervical adenopathy.  Neurological: She is alert and oriented to person, place, and time. No cranial nerve deficit. She exhibits normal muscle tone. Coordination normal.  Skin: Skin is warm and dry. No rash noted. She is not diaphoretic. No erythema. No pallor.  Psychiatric: Her behavior is normal. Judgment and thought content normal. Her mood appears anxious. She exhibits a depressed mood. She expresses no suicidal ideation.          Assessment & Plan:   Problem List Items Addressed This Visit   Anxiety     Significant increase in anxiety recently with ongoing trial. Offered support today. Will continue prn alprazolam, which she feels is working well. She will call or email if she feels that she needs additional assistance.    Relevant Medications  ALPRAZolam (XANAX) tablet   Hypertension - Primary      BP Readings from Last 3 Encounters:  11/08/13 110/80  05/13/13 110/62  07/20/12 116/80   BP well controlled on Triamterene-HCTZ. Will  continue. Will check fasting labs at her convenience prior to physical exam.    Relevant Medications      triamterene-hydrochlorothiazide (DYAZIDE) 37.5-25 MG per capsule    Other Visit Diagnoses   Routine general medical examination at a health care facility        Relevant Orders       CBC with Differential       Comprehensive metabolic panel       Lipid panel       Microalbumin / creatinine urine ratio       Vit D  25 hydroxy (rtn osteoporosis monitoring)        Return in about 3 months (around 02/08/2014) for Physical.

## 2013-11-08 NOTE — Assessment & Plan Note (Signed)
BP Readings from Last 3 Encounters:  11/08/13 110/80  05/13/13 110/62  07/20/12 116/80   BP well controlled on Triamterene-HCTZ. Will continue. Will check fasting labs at her convenience prior to physical exam.

## 2013-11-09 ENCOUNTER — Telehealth: Payer: Self-pay | Admitting: Internal Medicine

## 2013-11-09 NOTE — Telephone Encounter (Signed)
Relevant patient education assigned to patient using Emmi. ° °

## 2013-12-17 ENCOUNTER — Encounter: Payer: Self-pay | Admitting: Internal Medicine

## 2013-12-17 ENCOUNTER — Ambulatory Visit (INDEPENDENT_AMBULATORY_CARE_PROVIDER_SITE_OTHER): Payer: 59 | Admitting: Internal Medicine

## 2013-12-17 VITALS — BP 100/64 | HR 92 | Temp 99.7°F | Ht 66.0 in | Wt 170.2 lb

## 2013-12-17 DIAGNOSIS — R7303 Prediabetes: Secondary | ICD-10-CM

## 2013-12-17 DIAGNOSIS — R7309 Other abnormal glucose: Secondary | ICD-10-CM

## 2013-12-17 DIAGNOSIS — E118 Type 2 diabetes mellitus with unspecified complications: Secondary | ICD-10-CM | POA: Insufficient documentation

## 2013-12-17 DIAGNOSIS — F17200 Nicotine dependence, unspecified, uncomplicated: Secondary | ICD-10-CM

## 2013-12-17 DIAGNOSIS — Z720 Tobacco use: Secondary | ICD-10-CM

## 2013-12-17 DIAGNOSIS — I10 Essential (primary) hypertension: Secondary | ICD-10-CM

## 2013-12-17 MED ORDER — METFORMIN HCL 500 MG PO TABS: 500.0000 mg | ORAL_TABLET | Freq: Two times a day (BID) | ORAL | Status: DC

## 2013-12-17 NOTE — Patient Instructions (Signed)
Start Metformin 500mg  at bedtime. Increase to twice daily after one week if tolerated well.  If any nausea or diarrhea, stop the medication and email or call.  See information on Mediterranean diet.  Set goal of exercising, such walking 30min daily.  Follow up in 3 months.

## 2013-12-17 NOTE — Assessment & Plan Note (Signed)
Recent labs performed by her employer show elevated A1c 6%. Recommended Mediterranean style diet and exercise with goal of 30min daily at minimum. Will start Metformin 500mg  po bid. Discussed potential side effects from this medication. Will plan to recheck A1c in 3 months.

## 2013-12-17 NOTE — Assessment & Plan Note (Signed)
BP Readings from Last 3 Encounters:  12/17/13 100/64  11/08/13 110/80  05/13/13 110/62   BP well controlled with Triamterene-HCTZ. Consider change to Lisinopril-HCTZ next visit.

## 2013-12-17 NOTE — Assessment & Plan Note (Signed)
Strongly encouraged smoking cessation. Pt is not ready to quit.

## 2013-12-17 NOTE — Progress Notes (Signed)
Pre visit review using our clinic review tool, if applicable. No additional management support is needed unless otherwise documented below in the visit note. 

## 2013-12-17 NOTE — Progress Notes (Signed)
Subjective:    Patient ID: Jaclyn Lin, female    DOB: 10/31/1965, 48 y.o.   MRN: [id number]  HPI 48YO female presents for follow up after recent labs performed by her employer, [employer]. HgbA1c 6% She has been told in the past that she has "borderline" diabetes. She has never taken meds for this. She is not following any specific diet or exercise program.  She quit smoking prior to labs through her employer, however she is now smoking again. She felt extremely anxious when trying to quit and does not want to try to stop again.  Anxiety has improved after recent trial of her son's murder completed. However, continues to use Alprazolam at bedtime to help initiate sleep. She notes some improvement with this. No daytime drowsiness noted.   Review of Systems  Constitutional: Negative for fever, chills, appetite change, fatigue and unexpected weight change.  Eyes: Negative for visual disturbance.  Respiratory: Negative for shortness of breath.   Cardiovascular: Negative for chest pain and leg swelling.  Gastrointestinal: Negative for abdominal pain.  Endocrine: Negative for polydipsia, polyphagia and polyuria.  Skin: Negative for color change and rash.  Hematological: Negative for adenopathy. Does not bruise/bleed easily.  Psychiatric/Behavioral: Negative for dysphoric mood. The patient is not nervous/anxious.        Objective:    BP 100/64  Pulse 92  Temp(Src) 99.7 F (37.6 C) (Oral)  Ht 5\' 6"  (1.676 m)  Wt 170 lb 4 oz (77.225 kg)  BMI 27.49 kg/m2  SpO2 97% Physical Exam  Constitutional: She is oriented to person, place, and time. She appears well-developed and well-nourished. No distress.  HENT:  Head: Normocephalic and atraumatic.  Right Ear: External ear normal.  Left Ear: External ear normal.  Nose: Nose normal.  Mouth/Throat: Oropharynx is clear and moist. No oropharyngeal exudate.  Eyes: Conjunctivae are normal. Pupils are equal, round, and reactive to light.  Right eye exhibits no discharge. Left eye exhibits no discharge. No scleral icterus.  Neck: Normal range of motion. Neck supple. No tracheal deviation present. No thyromegaly present.  Cardiovascular: Normal rate, regular rhythm, normal heart sounds and intact distal pulses.  Exam reveals no gallop and no friction rub.   No murmur heard. Pulmonary/Chest: Effort normal and breath sounds normal. No accessory muscle usage. Not tachypneic. No respiratory distress. She has no decreased breath sounds. She has no wheezes. She has no rhonchi. She has no rales. She exhibits no tenderness.  Musculoskeletal: Normal range of motion. She exhibits no edema and no tenderness.  Lymphadenopathy:    She has no cervical adenopathy.  Neurological: She is alert and oriented to person, place, and time. No cranial nerve deficit. She exhibits normal muscle tone. Coordination normal.  Skin: Skin is warm and dry. No rash noted. She is not diaphoretic. No erythema. No pallor.  Psychiatric: She has a normal mood and affect. Her behavior is normal. Judgment and thought content normal.          Assessment & Plan:   Problem List Items Addressed This Visit     Unprioritized   Hypertension      BP Readings from Last 3 Encounters:  12/17/13 100/64  11/08/13 110/80  05/13/13 110/62   BP well controlled with Triamterene-HCTZ. Consider change to Lisinopril-HCTZ next visit.    Prediabetes - Primary     Recent labs performed by her employer show elevated A1c 6%. Recommended Mediterranean style diet and exercise with goal of 30min daily at minimum. Will start Metformin  500mg  po bid. Discussed potential side effects from this medication. Will plan to recheck A1c in 3 months.    Relevant Medications      metFORMIN (GLUCOPHAGE) tablet   Tobacco use     Strongly encouraged smoking cessation. Pt is not ready to quit.        Return in about 3 months (around 03/19/2014) for Recheck.

## 2014-01-10 ENCOUNTER — Encounter: Payer: Self-pay | Admitting: Internal Medicine

## 2014-07-21 ENCOUNTER — Encounter: Payer: Self-pay | Admitting: Internal Medicine

## 2014-07-21 ENCOUNTER — Ambulatory Visit (INDEPENDENT_AMBULATORY_CARE_PROVIDER_SITE_OTHER): Payer: 59 | Admitting: Internal Medicine

## 2014-07-21 ENCOUNTER — Other Ambulatory Visit (HOSPITAL_COMMUNITY)
Admission: RE | Admit: 2014-07-21 | Discharge: 2014-07-21 | Disposition: A | Discharge status: Home / Self Care | Payer: 59 | Source: Ambulatory Visit | Attending: Internal Medicine | Admitting: Internal Medicine

## 2014-07-21 VITALS — BP 120/74 | HR 84 | Temp 98.1°F | Ht 65.0 in | Wt 176.8 lb

## 2014-07-21 DIAGNOSIS — I10 Essential (primary) hypertension: Secondary | ICD-10-CM

## 2014-07-21 DIAGNOSIS — F419 Anxiety disorder, unspecified: Secondary | ICD-10-CM

## 2014-07-21 DIAGNOSIS — R053 Chronic cough: Secondary | ICD-10-CM

## 2014-07-21 DIAGNOSIS — Z0000 Encounter for general adult medical examination without abnormal findings: Secondary | ICD-10-CM | POA: Insufficient documentation

## 2014-07-21 DIAGNOSIS — Z1151 Encounter for screening for human papillomavirus (HPV): Secondary | ICD-10-CM | POA: Diagnosis present

## 2014-07-21 DIAGNOSIS — Z01419 Encounter for gynecological examination (general) (routine) without abnormal findings: Secondary | ICD-10-CM | POA: Diagnosis not present

## 2014-07-21 DIAGNOSIS — R05 Cough: Secondary | ICD-10-CM

## 2014-07-21 LAB — COMPREHENSIVE METABOLIC PANEL
ALT: 11 U/L (ref 0–35)
AST: 13 U/L (ref 0–37)
Albumin: 4.2 g/dL (ref 3.5–5.2)
Alkaline Phosphatase: 48 U/L (ref 39–117)
BUN: 15 mg/dL (ref 6–23)
CO2: 29 mEq/L (ref 19–32)
CREATININE: 0.73 mg/dL (ref 0.40–1.20)
Calcium: 9.6 mg/dL (ref 8.4–10.5)
Chloride: 103 mEq/L (ref 96–112)
GFR: 108.95 mL/min (ref >60.00)
Glucose, Bld: 108 mg/dL — ABNORMAL HIGH (ref 70–99)
POTASSIUM: 3.4 meq/L — AB (ref 3.5–5.1)
Sodium: 139 mEq/L (ref 135–145)
Total Bilirubin: 0.3 mg/dL (ref 0.2–1.2)
Total Protein: 7.2 g/dL (ref 6.0–8.3)

## 2014-07-21 LAB — MICROALBUMIN / CREATININE URINE RATIO
Creatinine,U: 163.4 mg/dL
Microalb Creat Ratio: 0.8 mg/g (ref 0.0–30.0)
Microalb, Ur: 1.3 mg/dL (ref 0.0–1.9)

## 2014-07-21 LAB — CBC WITH DIFFERENTIAL/PLATELET
BASOS ABS: 0.1 10*3/uL (ref 0.0–0.1)
BASOS PCT: 0.9 % (ref 0.0–3.0)
Eosinophils Absolute: 0.2 10*3/uL (ref 0.0–0.7)
Eosinophils Relative: 2.1 % (ref 0.0–5.0)
HCT: 37.4 % (ref 36.0–46.0)
HEMOGLOBIN: 12.6 g/dL (ref 12.0–15.0)
LYMPHS ABS: 3.2 10*3/uL (ref 0.7–4.0)
LYMPHS PCT: 43.3 % (ref 12.0–46.0)
MCHC: 33.5 g/dL (ref 30.0–36.0)
MCV: 82.2 fl (ref 78.0–100.0)
Monocytes Absolute: 0.7 10*3/uL (ref 0.1–1.0)
Monocytes Relative: 9.4 % (ref 3.0–12.0)
Neutro Abs: 3.3 10*3/uL (ref 1.4–7.7)
Neutrophils Relative %: 44.3 % (ref 43.0–77.0)
Platelets: 260 10*3/uL (ref 150.0–400.0)
RBC: 4.55 Mil/uL (ref 3.87–5.11)
RDW: 13.7 % (ref 11.5–15.5)
WBC: 7.4 10*3/uL (ref 4.0–10.5)

## 2014-07-21 LAB — LIPID PANEL
CHOL/HDL RATIO: 4
CHOLESTEROL: 179 mg/dL (ref 0–200)
HDL: 43.9 mg/dL (ref >39.00)
LDL Cholesterol: 102 mg/dL — ABNORMAL HIGH (ref 0–99)
NONHDL: 135.1
Triglycerides: 166 mg/dL — ABNORMAL HIGH (ref 0.0–149.0)
VLDL: 33.2 mg/dL (ref 0.0–40.0)

## 2014-07-21 LAB — HEMOGLOBIN A1C: Hgb A1c MFr Bld: 6.2 % (ref 4.6–6.5)

## 2014-07-21 MED ORDER — ALPRAZOLAM 0.25 MG PO TABS: 0.2500 mg | ORAL_TABLET | Freq: Three times a day (TID) | ORAL | Status: DC | PRN

## 2014-07-21 MED ORDER — OMEPRAZOLE 40 MG PO CPDR: 40.0000 mg | DELAYED_RELEASE_CAPSULE | Freq: Every day | ORAL | Status: DC

## 2014-07-21 MED ORDER — TRIAMTERENE-HCTZ 37.5-25 MG PO CAPS: ORAL_CAPSULE | ORAL | Status: DC

## 2014-07-21 NOTE — Progress Notes (Signed)
Subjective:    Patient ID: Jaclyn Lin, female    DOB: 07/20/1965, 49 y.o.   MRN: [id number]  HPI 49YO female presents for annual exam.  Feeling well. No concerns today. No changes to medical history. Trying to work on diet and exercise by walking.   Past medical, surgical, family and social history per today's encounter.  Review of Systems  Constitutional: Negative for fever, chills, appetite change, fatigue and unexpected weight change.  Eyes: Negative for visual disturbance.  Respiratory: Negative for shortness of breath.   Cardiovascular: Negative for chest pain and leg swelling.  Gastrointestinal: Negative for nausea, vomiting, abdominal pain, diarrhea and constipation.  Musculoskeletal: Negative for myalgias and arthralgias.  Skin: Negative for color change and rash.  Hematological: Negative for adenopathy. Does not bruise/bleed easily.  Psychiatric/Behavioral: Negative for sleep disturbance and dysphoric mood. The patient is not nervous/anxious.        Objective:    BP 120/74 mmHg  Pulse 84  Temp(Src) 98.1 F (36.7 C) (Oral)  Ht 5\' 5"  (1.651 m)  Wt 176 lb 12 oz (80.173 kg)  BMI 29.41 kg/m2  SpO2 99% Physical Exam  Constitutional: She is oriented to person, place, and time. She appears well-developed and well-nourished. No distress.  HENT:  Head: Normocephalic and atraumatic.  Right Ear: External ear normal.  Left Ear: External ear normal.  Nose: Nose normal.  Mouth/Throat: Oropharynx is clear and moist. No oropharyngeal exudate.  Eyes: Conjunctivae are normal. Pupils are equal, round, and reactive to light. Right eye exhibits no discharge. Left eye exhibits no discharge. No scleral icterus.  Neck: Normal range of motion. Neck supple. No tracheal deviation present. No thyromegaly present.  Cardiovascular: Normal rate, regular rhythm, normal heart sounds and intact distal pulses.  Exam reveals no gallop and no friction rub.   No murmur  heard. Pulmonary/Chest: Effort normal and breath sounds normal. No respiratory distress. She has no wheezes. She has no rales. She exhibits no tenderness.  Abdominal: Soft. Bowel sounds are normal. She exhibits no distension and no mass. There is no tenderness. There is no rebound and no guarding.  Genitourinary: Rectum normal, vagina normal and uterus normal. No breast swelling, tenderness, discharge or bleeding. Pelvic exam was performed with patient supine. There is no rash, tenderness or lesion on the right labia. There is no rash, tenderness or lesion on the left labia. Uterus is not enlarged and not tender. Cervix exhibits no motion tenderness, no discharge and no friability. Right adnexum displays no mass, no tenderness and no fullness. Left adnexum displays no mass, no tenderness and no fullness. No erythema or tenderness in the vagina. No vaginal discharge found.  Musculoskeletal: Normal range of motion. She exhibits no edema or tenderness.  Lymphadenopathy:    She has no cervical adenopathy.  Neurological: She is alert and oriented to person, place, and time. No cranial nerve deficit. She exhibits normal muscle tone. Coordination normal.  Skin: Skin is warm and dry. No rash noted. She is not diaphoretic. No erythema. No pallor.  Psychiatric: She has a normal mood and affect. Her behavior is normal. Judgment and thought content normal.          Assessment & Plan:   Problem List Items Addressed This Visit      Unprioritized   Anxiety   Relevant Medications   ALPRAZolam  (XANAX) tablet   Hypertension   Relevant Medications   triamterene-hydrochlorothiazide (DYAZIDE) 37.5-25 MG per capsule   Routine general medical examination at a  health care facility - Primary    General medical exam normal today including breast exam and pelvic exam. PAP pending. Encouraged healthy diet and exercise. Encouraged smoking cessation. Mammogram ordered. Colonoscopy planned for next year. Labs today  including CBC, CMP, lipids. Flu vaccine declined.      Relevant Orders   CBC with Differential/Platelet   Comprehensive metabolic panel   Lipid panel   Hemoglobin A1c   Microalbumin / creatinine urine ratio   Vit D  25 hydroxy (rtn osteoporosis monitoring)   MM Digital Screening    Other Visit Diagnoses    Cough, persistent        Relevant Medications    omeprazole (PRILOSEC) capsule        Return in about 6 months (around 01/19/2015) for Recheck.

## 2014-07-21 NOTE — Assessment & Plan Note (Signed)
General medical exam normal today including breast exam and pelvic exam. PAP pending. Encouraged healthy diet and exercise. Encouraged smoking cessation. Mammogram ordered. Colonoscopy planned for next year. Labs today including CBC, CMP, lipids. Flu vaccine declined.

## 2014-07-21 NOTE — Addendum Note (Signed)
Addended by: Montine CircleMALDONADO, Kyliee Ortego D on: 07/21/2014 02:16 PM   Modules accepted: Orders

## 2014-07-21 NOTE — Patient Instructions (Signed)

## 2014-07-21 NOTE — Progress Notes (Signed)
Pre visit review using our clinic review tool, if applicable. No additional management support is needed unless otherwise documented below in the visit note. 

## 2014-07-22 ENCOUNTER — Telehealth: Payer: Self-pay | Admitting: Internal Medicine

## 2014-07-22 LAB — VITAMIN D 25 HYDROXY (VIT D DEFICIENCY, FRACTURES): VITD: 14.77 ng/mL — ABNORMAL LOW (ref 30.00–100.00)

## 2014-07-22 NOTE — Telephone Encounter (Signed)
emmi emailed °

## 2014-07-25 LAB — CYTOLOGY - PAP

## 2014-08-29 LAB — HM MAMMOGRAPHY: HM MAMMO: NEGATIVE

## 2014-12-29 ENCOUNTER — Encounter: Payer: Self-pay | Admitting: Internal Medicine

## 2015-01-27 ENCOUNTER — Encounter: Payer: Self-pay | Admitting: Internal Medicine

## 2015-01-27 ENCOUNTER — Ambulatory Visit (INDEPENDENT_AMBULATORY_CARE_PROVIDER_SITE_OTHER): Payer: 59 | Admitting: Internal Medicine

## 2015-01-27 VITALS — BP 107/68 | HR 59 | Temp 98.9°F | Ht 65.5 in | Wt 180.4 lb

## 2015-01-27 DIAGNOSIS — R5382 Chronic fatigue, unspecified: Secondary | ICD-10-CM | POA: Insufficient documentation

## 2015-01-27 DIAGNOSIS — Z720 Tobacco use: Secondary | ICD-10-CM

## 2015-01-27 DIAGNOSIS — I10 Essential (primary) hypertension: Secondary | ICD-10-CM | POA: Diagnosis not present

## 2015-01-27 MED ORDER — TRIAMTERENE-HCTZ 37.5-25 MG PO CAPS: ORAL_CAPSULE | ORAL | Status: DC

## 2015-01-27 NOTE — Patient Instructions (Signed)
Labs today.  Follow up in 6 months. 

## 2015-01-27 NOTE — Assessment & Plan Note (Signed)
Fatigue likely secondary to sleep disruption. Encouraged her to try to limit hours at her second job when possible, to allow for more sleep with goal of closer to 8hr per night. Labs today including CMP, CBC, TSH, B12.

## 2015-01-27 NOTE — Progress Notes (Signed)
Subjective:    Patient ID: Jaclyn Lin, female    DOB: August 22, 1965, 49 y.o.   MRN: [id number]  HPI  49YO female presents for annual exam.  HTN - Has some stomach pain for a few min after taking Dyazide. Noticed this only with tablets.  Feeling more tired over last few months. - Working two jobs. Goes to bed about 11pm, then wakes at 2-3am to be at work at [employer]. No focal symptoms. No change in appetite, no change in bowel habits. No chest pain, dyspnea. Compliant with medications.  Past medical, surgical, family and social history per today's encounter.   Review of Systems  Constitutional: Positive for fatigue. Negative for fever, chills, appetite change and unexpected weight change.  Eyes: Negative for visual disturbance.  Respiratory: Negative for shortness of breath.   Cardiovascular: Negative for chest pain and leg swelling.  Gastrointestinal: Negative for nausea, vomiting, abdominal pain, diarrhea and constipation.  Musculoskeletal: Negative for myalgias and arthralgias.  Skin: Negative for color change and rash.  Hematological: Negative for adenopathy. Does not bruise/bleed easily.  Psychiatric/Behavioral: Positive for sleep disturbance. Negative for suicidal ideas and dysphoric mood. The patient is not nervous/anxious.        Objective:    BP 107/68 mmHg  Pulse 59  Temp(Src) 98.9 F (37.2 C) (Oral)  Ht 5' 5.5" (1.664 m)  Wt 180 lb 6 oz (81.818 kg)  BMI 29.55 kg/m2  SpO2 97% Physical Exam  Constitutional: She is oriented to person, place, and time. She appears well-developed and well-nourished. No distress.  HENT:  Head: Normocephalic and atraumatic.  Right Ear: External ear normal.  Left Ear: External ear normal.  Nose: Nose normal.  Mouth/Throat: Oropharynx is clear and moist. No oropharyngeal exudate.  Eyes: Conjunctivae are normal. Pupils are equal, round, and reactive to light. Right eye exhibits no discharge. Left eye exhibits no discharge. No  scleral icterus.  Neck: Normal range of motion. Neck supple. No tracheal deviation present. No thyromegaly present.  Cardiovascular: Normal rate, regular rhythm, normal heart sounds and intact distal pulses.  Exam reveals no gallop and no friction rub.   No murmur heard. Pulmonary/Chest: Effort normal and breath sounds normal. No respiratory distress. She has no wheezes. She has no rales. She exhibits no tenderness.  Musculoskeletal: Normal range of motion. She exhibits no edema or tenderness.  Lymphadenopathy:    She has no cervical adenopathy.  Neurological: She is alert and oriented to person, place, and time. No cranial nerve deficit. She exhibits normal muscle tone. Coordination normal.  Skin: Skin is warm and dry. No rash noted. She is not diaphoretic. No erythema. No pallor.  Psychiatric: She has a normal mood and affect. Her behavior is normal. Judgment and thought content normal.          Assessment & Plan:   Problem List Items Addressed This Visit      Unprioritized   Chronic fatigue    Fatigue likely secondary to sleep disruption. Encouraged her to try to limit hours at her second job when possible, to allow for more sleep with goal of closer to 8hr per night. Labs today including CMP, CBC, TSH, B12.      Relevant Orders   CBC   TSH   B12   Hypertension - Primary    BP Readings from Last 3 Encounters:  01/27/15 107/68  07/21/14 120/74  12/17/13 100/64   BP well controlled. Will check renal function with labs. Continue Dyazide. Will ask for capsules  as not tolerating tablet form.      Relevant Medications   triamterene-hydrochlorothiazide (DYAZIDE) 37.5-25 MG per capsule   Other Relevant Orders   Comprehensive metabolic panel   Hemoglobin A1c   Lipid panel   Tobacco use    Pt has quit smoking. Encouraged her to continue to abstain from smoking.      Relevant Orders   Nicotine/cotinine metabolites       Return in about 6 months (around 07/30/2015) for  Physical.

## 2015-01-27 NOTE — Assessment & Plan Note (Signed)
BP Readings from Last 3 Encounters:  01/27/15 107/68  07/21/14 120/74  12/17/13 100/64   BP well controlled. Will check renal function with labs. Continue Dyazide. Will ask for capsules as not tolerating tablet form.

## 2015-01-27 NOTE — Assessment & Plan Note (Signed)
Pt has quit smoking. Encouraged her to continue to abstain from smoking.

## 2015-01-27 NOTE — Progress Notes (Signed)
Pre visit review using our clinic review tool, if applicable. No additional management support is needed unless otherwise documented below in the visit note. 

## 2015-02-02 ENCOUNTER — Telehealth: Payer: Self-pay | Admitting: *Deleted

## 2015-02-02 LAB — CBC
HEMATOCRIT: 36.4 % (ref 34.0–46.6)
Hemoglobin: 12 g/dL (ref 11.1–15.9)
MCH: 27 pg (ref 26.6–33.0)
MCHC: 33 g/dL (ref 31.5–35.7)
MCV: 82 fL (ref 79–97)
PLATELETS: 265 10*3/uL (ref 150–379)
RBC: 4.45 x10E6/uL (ref 3.77–5.28)
RDW: 14 % (ref 12.3–15.4)
WBC: 6.5 10*3/uL (ref 3.4–10.8)

## 2015-02-02 LAB — LIPID PANEL
CHOLESTEROL TOTAL: 186 mg/dL (ref 100–199)
Chol/HDL Ratio: 4 ratio units (ref 0.0–4.4)
HDL: 47 mg/dL (ref >39)
LDL CALC: 102 mg/dL — AB (ref 0–99)
TRIGLYCERIDES: 185 mg/dL — AB (ref 0–149)
VLDL Cholesterol Cal: 37 mg/dL (ref 5–40)

## 2015-02-02 LAB — NICOTINE/COTININE METABOLITES
Cotinine: 14.3 ng/mL
Nicotine: NOT DETECTED ng/mL

## 2015-02-02 LAB — COMPREHENSIVE METABOLIC PANEL
A/G RATIO: 1.5 (ref 1.1–2.5)
ALBUMIN: 4.4 g/dL (ref 3.5–5.5)
ALT: 18 IU/L (ref 0–32)
AST: 17 IU/L (ref 0–40)
Alkaline Phosphatase: 49 IU/L (ref 39–117)
BUN / CREAT RATIO: 19 (ref 9–23)
BUN: 13 mg/dL (ref 6–24)
CHLORIDE: 101 mmol/L (ref 97–108)
CO2: 22 mmol/L (ref 18–29)
Calcium: 9.7 mg/dL (ref 8.7–10.2)
Creatinine, Ser: 0.67 mg/dL (ref 0.57–1.00)
GFR, EST AFRICAN AMERICAN: 119 mL/min/{1.73_m2} (ref >59)
GFR, EST NON AFRICAN AMERICAN: 104 mL/min/{1.73_m2} (ref >59)
GLUCOSE: 96 mg/dL (ref 65–99)
Globulin, Total: 2.9 g/dL (ref 1.5–4.5)
POTASSIUM: 4.1 mmol/L (ref 3.5–5.2)
Sodium: 140 mmol/L (ref 134–144)
Total Protein: 7.3 g/dL (ref 6.0–8.5)

## 2015-02-02 LAB — VITAMIN B12: Vitamin B-12: 403 pg/mL (ref 211–946)

## 2015-02-02 LAB — HEMOGLOBIN A1C
Est. average glucose Bld gHb Est-mCnc: 131 mg/dL
Hgb A1c MFr Bld: 6.2 % — ABNORMAL HIGH (ref 4.8–5.6)

## 2015-02-02 LAB — TSH: TSH: 1.85 u[IU]/mL (ref 0.450–4.500)

## 2015-02-02 NOTE — Telephone Encounter (Signed)
Patient stated that she was in the office last Friday. She left a sheet for Dr. Dan Humphreys to fax her results to work place. Patient wanted to know if sheet has been faxed.Thanks

## 2015-02-07 NOTE — Telephone Encounter (Signed)
Jaclyn Lin - Do you know about this form?

## 2015-02-07 NOTE — Telephone Encounter (Signed)
Notified pt that form was faxed

## 2015-06-10 ENCOUNTER — Encounter: Payer: Self-pay | Admitting: Gynecology

## 2015-06-10 ENCOUNTER — Ambulatory Visit
Admission: EM | Admit: 2015-06-10 | Discharge: 2015-06-10 | Disposition: A | Discharge status: Home / Self Care | Payer: 59 | Attending: Internal Medicine | Admitting: Internal Medicine

## 2015-06-10 DIAGNOSIS — R3129 Other microscopic hematuria: Secondary | ICD-10-CM | POA: Diagnosis not present

## 2015-06-10 DIAGNOSIS — J069 Acute upper respiratory infection, unspecified: Secondary | ICD-10-CM

## 2015-06-10 LAB — URINALYSIS COMPLETE WITH MICROSCOPIC (ARMC ONLY)
BILIRUBIN URINE: NEGATIVE
GLUCOSE, UA: NEGATIVE mg/dL
KETONES UR: NEGATIVE mg/dL
Leukocytes, UA: NEGATIVE
Nitrite: NEGATIVE
Protein, ur: NEGATIVE mg/dL
WBC, UA: NONE SEEN WBC/hpf (ref 0–5)
pH: 5.5 (ref 5.0–8.0)

## 2015-06-10 LAB — RAPID STREP SCREEN (MED CTR MEBANE ONLY): Streptococcus, Group A Screen (Direct): NEGATIVE

## 2015-06-10 MED ORDER — BENZONATATE 200 MG PO CAPS: 200.0000 mg | ORAL_CAPSULE | Freq: Three times a day (TID) | ORAL | Status: DC | PRN

## 2015-06-10 MED ORDER — ALBUTEROL SULFATE HFA 108 (90 BASE) MCG/ACT IN AERS: 1.0000 | INHALATION_SPRAY | Freq: Four times a day (QID) | RESPIRATORY_TRACT | Status: DC | PRN

## 2015-06-10 NOTE — Discharge Instructions (Signed)
Prescriptions for tessalon (for cough) and for [facility] [address] Recheck or followup pcp/Dr Dan HumphreysWalker for increasing phlegm production, new fever >100.5, or if not starting to improve in a few days. Strep swab was negative; a throat culture is pending. Tiny bit of blood (red blood cells) in the urine today; urine culture is pending. If blood in the urine has been a persistent finding over time, might consider imaging of the urinary tract and/or assessment for nephritis.

## 2015-06-10 NOTE — ED Provider Notes (Signed)
CSN: [id number]     Arrival date & time 06/10/15  1351 History   First MD Initiated Contact with Patient 06/10/15 1434     Chief Complaint  Patient presents with  . Sore Throat  . Urinary Tract Infection   HPI  Patient is a 49 year old lady who presents with 2 symptoms today, scratchy sore throat for about the last week, with a little bit of cough. She also has some runny nose. No fever, no headache. Some ear congestion, causing her to feel little bit dizzy at times. She also for a couple of months has had a little bit of discomfort with urination, not really stinging, but has not been drinking as much water as usual. No vaginal discharge, no unusual vaginal bleeding. No nausea/vomiting, no diarrhea. Maybe a little bit of constipation, she has been holding it rather than having a bowel movement in the morning because of company in the house. No real abdominal pain, although she does intermittently have some discomfort.  Past Medical History  Diagnosis Date  . Hypertension   . UTI (lower urinary tract infection)    Past Surgical History  Procedure Laterality Date  . Abdominal hysterectomy      partial, Dr. Haskel KhanVanDalen  . Vaginal delivery      2   Family History  Problem Relation Age of Onset  . Hypertension Mother   . Cancer Maternal Grandfather   . Asthma Maternal Grandmother   . Asthma Son    Social History  Substance Use Topics  . Smoking status: Current Every Day Smoker -- 0.50 packs/day for 20 years  . Smokeless tobacco: None  . Alcohol Use: Yes    Review of Systems  All other systems reviewed and are negative.   Allergies  Review of patient's allergies indicates no known allergies.  Home Medications   Prior to Admission medications   Medication Sig Start Date End Date Taking? Authorizing Provider  ALPRAZolam (XANAX) 0.25 MG tablet Take 1 tablet (0.25 mg total) by mouth 3 (three) times daily as needed for anxiety or sleep. 07/21/14  Yes Shelia MediaJennifer A Walker, MD   metFORMIN (GLUCOPHAGE) 500 MG tablet Take 1 tablet (500 mg total) by mouth 2 (two) times daily with a meal. 12/17/13  Yes Shelia MediaJennifer A Walker, MD  triamterene-hydrochlorothiazide (DYAZIDE) 37.5-25 MG per capsule take 1 capsule by mouth once daily 01/27/15  Yes Shelia MediaJennifer A Walker, MD                fluticasone (FLONASE) 50 MCG/ACT nasal spray Place 2 sprays into both nostrils daily. 05/13/13   Raquel Conni ElliotM Rey, NP  omeprazole (PRILOSEC) 40 MG capsule Take 1 capsule (40 mg total) by mouth daily. 07/21/14   Shelia MediaJennifer A Walker, MD    BP 116/72 mmHg  Pulse 91  Temp(Src) 98.5 F (36.9 C) (Oral)  Ht 5\' 6"  (1.676 m)  Wt 175 lb (79.379 kg)  BMI 28.26 kg/m2  SpO2 98%   Physical Exam  Constitutional: She is oriented to person, place, and time. No distress.  Alert, nicely groomed  HENT:  Head: Atraumatic.  Bilateral TMs are maybe a little bit dull, no erythema Mild to moderate nasal congestion Throat is a little bit red  Eyes:  Conjugate gaze, no eye redness/drainage  Neck: Neck supple.  Cardiovascular: Normal rate and regular rhythm.   Pulmonary/Chest: No respiratory distress. She has no wheezes. She has no rales.  Lungs clear, symmetric breath sounds  Abdominal: She exhibits no distension.  Musculoskeletal: Normal range  of motion. She exhibits no edema.  No leg swelling  Neurological: She is alert and oriented to person, place, and time.  Skin: Skin is warm and dry.  No cyanosis  Nursing note and vitals reviewed.   ED Course  Procedures (including critical care time)  Labs Review  Results for orders placed or performed during the hospital encounter of 06/10/15  Rapid strep screen  Result Value Ref Range   Streptococcus, Group A Screen (Direct) NEGATIVE NEGATIVE  Urinalysis complete, with microscopic  Result Value Ref Range   Color, Urine YELLOW YELLOW   APPearance CLEAR CLEAR   Glucose, UA NEGATIVE NEGATIVE mg/dL   Bilirubin Urine NEGATIVE NEGATIVE   Ketones, ur NEGATIVE NEGATIVE  mg/dL   Specific Gravity, Urine >1.030 (H) 1.005 - 1.030   Hgb urine dipstick 2+ (A) NEGATIVE   pH 5.5 5.0 - 8.0   Protein, ur NEGATIVE NEGATIVE mg/dL   Nitrite NEGATIVE NEGATIVE   Leukocytes, UA NEGATIVE NEGATIVE   RBC / HPF 6-30 0 - 5 RBC/hpf   WBC, UA NONE SEEN 0 - 5 WBC/hpf   Bacteria, UA RARE (A) NONE SEEN   Squamous Epithelial / LPF 0-5 (A) NONE SEEN   Throat and urine cultures are pending. Patient relates that she has had hematuria noted on urinalyses before.   MDM   1. Upper respiratory infection, acute   2. Hematuria, microscopic    New Prescriptions   ALBUTEROL (PROVENTIL HFA;VENTOLIN HFA) 108 (90 BASE) MCG/ACT INHALER    Inhale 1-2 puffs into the lungs every 6 (six) hours as needed for wheezing or shortness of breath.   BENZONATATE (TESSALON) 200 MG CAPSULE    Take 1 capsule (200 mg total) by mouth 3 (three) times daily as needed for cough.   No clear indication for antibiotics at present. Recheck for increasing phlegm production, new fever greater than 100.5, or if not starting to improve in a few days. If hematuria has been persistent, might benefit from imaging of the urinary tract, or evaluation for nephritis.    Eustace Moore, MD 06/14/15 [phone]

## 2015-06-10 NOTE — ED Notes (Signed)
Patient c/o sore throat / ears clogged / and uti.

## 2015-06-12 LAB — CULTURE, GROUP A STREP (THRC)

## 2015-06-13 LAB — URINE CULTURE: Culture: NO GROWTH

## 2015-09-04 ENCOUNTER — Encounter: Payer: Self-pay | Admitting: Emergency Medicine

## 2015-09-04 ENCOUNTER — Emergency Department
Admission: EM | Admit: 2015-09-04 | Discharge: 2015-09-04 | Disposition: A | Discharge status: Home / Self Care | Payer: 59 | Attending: Emergency Medicine | Admitting: Emergency Medicine

## 2015-09-04 DIAGNOSIS — H5711 Ocular pain, right eye: Secondary | ICD-10-CM | POA: Insufficient documentation

## 2015-09-04 DIAGNOSIS — F17200 Nicotine dependence, unspecified, uncomplicated: Secondary | ICD-10-CM | POA: Diagnosis not present

## 2015-09-04 DIAGNOSIS — I10 Essential (primary) hypertension: Secondary | ICD-10-CM | POA: Diagnosis not present

## 2015-09-04 NOTE — ED Notes (Signed)
Pt states that she had to be at work at [employer]am and that she is not waiting anymore. Pt was advised by Sherilyn CooterHenry RN to stay and get medication but the Pt refused.

## 2015-09-04 NOTE — ED Provider Notes (Signed)
Patient was seen and evaluated by PA student here in the emergency department. Student presented to the provider the patient's clinical symptoms and physical exam. Prior to student and provider returning into the room, patient left without being seen by this provider.   Jaclyn RoyalsJonathan D Cuthriell, PA-C 09/04/15 2001  Arnaldo NatalPaul F Malinda, MD 09/05/15 [phone]

## 2015-09-04 NOTE — ED Notes (Signed)
Pt reports was rubbing right eye today and had contact in.  Did have some pain in right eye previously but currently no pain. Denies vision changes. Sclera red to right eye.

## 2015-09-11 ENCOUNTER — Ambulatory Visit (INDEPENDENT_AMBULATORY_CARE_PROVIDER_SITE_OTHER): Payer: 59 | Admitting: Family Medicine

## 2015-09-11 ENCOUNTER — Encounter: Payer: Self-pay | Admitting: Family Medicine

## 2015-09-11 DIAGNOSIS — H8110 Benign paroxysmal vertigo, unspecified ear: Secondary | ICD-10-CM

## 2015-09-11 LAB — BASIC METABOLIC PANEL
BUN: 13 mg/dL (ref 6–23)
CHLORIDE: 101 meq/L (ref 96–112)
CO2: 30 mEq/L (ref 19–32)
Calcium: 9.9 mg/dL (ref 8.4–10.5)
Creatinine, Ser: 0.77 mg/dL (ref 0.40–1.20)
GFR: 101.97 mL/min (ref >60.00)
GLUCOSE: 110 mg/dL — AB (ref 70–99)
POTASSIUM: 4 meq/L (ref 3.5–5.1)
Sodium: 139 mEq/L (ref 135–145)

## 2015-09-11 MED ORDER — MECLIZINE HCL 25 MG PO TABS: 25.0000 mg | ORAL_TABLET | Freq: Three times a day (TID) | ORAL | Status: DC | PRN

## 2015-09-11 NOTE — Patient Instructions (Addendum)
Take the meclizine as needed.  If it does not resolve I am happy to send you to vestibular rehab.  Benign Positional Vertigo Vertigo is the feeling that you or your surroundings are moving when they are not. Benign positional vertigo is the most common form of vertigo. The cause of this condition is not serious (is benign). This condition is triggered by certain movements and positions (is positional). This condition can be dangerous if it occurs while you are doing something that could endanger you or others, such as driving.  CAUSES In many cases, the cause of this condition is not known. It may be caused by a disturbance in an area of the inner ear that helps your brain to sense movement and balance. This disturbance can be caused by a viral infection (labyrinthitis), head injury, or repetitive motion. RISK FACTORS This condition is more likely to develop in: 1. Women. 2. People who are 68 years of age or older. SYMPTOMS Symptoms of this condition usually happen when you move your head or your eyes in different directions. Symptoms may start suddenly, and they usually last for less than a minute. Symptoms may include:  Loss of balance and falling.  Feeling like you are spinning or moving.  Feeling like your surroundings are spinning or moving.  Nausea and vomiting.  Blurred vision.  Dizziness.  Involuntary eye movement (nystagmus). Symptoms can be mild and cause only slight annoyance, or they can be severe and interfere with daily life. Episodes of benign positional vertigo may return (recur) over time, and they may be triggered by certain movements. Symptoms may improve over time. DIAGNOSIS This condition is usually diagnosed by medical history and a physical exam of the head, neck, and ears. You may be referred to a health care provider who specializes in ear, nose, and throat (ENT) problems (otolaryngologist) or a provider who specializes in disorders of the nervous system  (neurologist). You may have additional testing, including:  MRI.  A CT scan.  Eye movement tests. Your health care provider may ask you to change positions quickly while he or she watches you for symptoms of benign positional vertigo, such as nystagmus. Eye movement may be tested with an electronystagmogram (ENG), caloric stimulation, the Dix-Hallpike test, or the roll test.  An electroencephalogram (EEG). This records electrical activity in your brain.  Hearing tests. TREATMENT Usually, your health care provider will treat this by moving your head in specific positions to adjust your inner ear back to normal. Surgery may be needed in severe cases, but this is rare. In some cases, benign positional vertigo may resolve on its own in 2-4 weeks. HOME CARE INSTRUCTIONS Safety  Move slowly.Avoid sudden body or head movements.  Avoid driving.  Avoid operating heavy machinery.  Avoid doing any tasks that would be dangerous to you or others if a vertigo episode would occur.  If you have trouble walking or keeping your balance, try using a cane for stability. If you feel dizzy or unstable, sit down right away.  Return to your normal activities as told by your health care provider. Ask your health care provider what activities are safe for you. General Instructions  Take over-the-counter and prescription medicines only as told by your health care provider.  Avoid certain positions or movements as told by your health care provider.  Drink enough fluid to keep your urine clear or pale yellow.  Keep all follow-up visits as told by your health care provider. This is important. SEEK MEDICAL CARE  IF:  You have a fever.  Your condition gets worse or you develop new symptoms.  Your family or friends notice any behavioral changes.  Your nausea or vomiting gets worse.  You have numbness or a "pins and needles" sensation. SEEK IMMEDIATE MEDICAL CARE IF:  You have difficulty speaking or  moving.  You are always dizzy.  You faint.  You develop severe headaches.  You have weakness in your legs or arms.  You have changes in your hearing or vision.  You develop a stiff neck.  You develop sensitivity to light.   This information is not intended to replace advice given to you by your health care provider. Make sure you discuss any questions you have with your health care provider.   Document Released: 03/18/2006 Document Revised: 03/01/2015 Document Reviewed: 10/03/2014 Elsevier Interactive Patient Education 2016 [employer]  Teaching laboratory technicianpley Maneuver Self-Care WHAT IS THE EPLEY MANEUVER? The Epley maneuver is an exercise you can do to relieve symptoms of benign paroxysmal positional vertigo (BPPV). This condition is often just referred to as vertigo. BPPV is caused by the movement of tiny crystals (canaliths) inside your inner ear. The accumulation and movement of canaliths in your inner ear causes a sudden spinning sensation (vertigo) when you move your head to certain positions. Vertigo usually lasts about 30 seconds. BPPV usually occurs in just one ear. If you get vertigo when you lie on your left side, you probably have BPPV in your left ear. Your health care provider can tell you which ear is involved.  BPPV may be caused by a head injury. Many people older than 50 get BPPV for unknown reasons. If you have been diagnosed with BPPV, your health care provider may teach you how to do this maneuver. BPPV is not life threatening (benign) and usually goes away in time.  WHEN SHOULD I PERFORM THE EPLEY MANEUVER? You can do this maneuver at home whenever you have symptoms of vertigo. You may do the Epley maneuver up to 3 times a day until your symptoms of vertigo go away. HOW SHOULD I DO THE EPLEY MANEUVER? 3. Sit on the edge of a bed or table with your back straight. Your legs should be extended or hanging over the edge of the bed or table.  4. Turn your head halfway toward the  affected ear.  5. Lie backward quickly with your head turned until you are lying flat on your back. You may want to position a pillow under your shoulders.  6. Hold this position for 30 seconds. You may experience an attack of vertigo. This is normal. Hold this position until the vertigo stops. 7. Then turn your head to the opposite direction until your unaffected ear is facing the floor.  8. Hold this position for 30 seconds. You may experience an attack of vertigo. This is normal. Hold this position until the vertigo stops. 9. Now turn your whole body to the same side as your head. Hold for another 30 seconds.  10. You can then sit back up. ARE THERE RISKS TO THIS MANEUVER? In some cases, you may have other symptoms (such as changes in your vision, weakness, or numbness). If you have these symptoms, stop doing the maneuver and call your health care provider. Even if doing these maneuvers relieves your vertigo, you may still have dizziness. Dizziness is the sensation of light-headedness but without the sensation of movement. Even though the Epley maneuver may relieve your vertigo, it is possible that your symptoms will  return within 5 years. WHAT SHOULD I DO AFTER THIS MANEUVER? After doing the Epley maneuver, you can return to your normal activities. Ask your doctor if there is anything you should do at home to prevent vertigo. This may include:  Sleeping with two or more pillows to keep your head elevated.  Not sleeping on the side of your affected ear.  Getting up slowly from bed.  Avoiding sudden movements during the day.  Avoiding extreme head movement, like looking up or bending over.  Wearing a cervical collar to prevent sudden head movements. WHAT SHOULD I DO IF MY SYMPTOMS GET WORSE? Call your health care provider if your vertigo gets worse. Call your provider right way if you have other symptoms, including:    Nausea.  Vomiting.  Headache.  Weakness.  Numbness.  Vision changes.   This information is not intended to replace advice given to you by your health care provider. Make sure you discuss any questions you have with your health care provider.   Document Released: 06/15/2013 Document Reviewed: 06/15/2013 Elsevier Interactive Patient Education [employer]

## 2015-09-11 NOTE — Progress Notes (Signed)
Pre visit review using our clinic review tool, if applicable. No additional management support is needed unless otherwise documented below in the visit note. 

## 2015-09-11 NOTE — Assessment & Plan Note (Signed)
New problem. History consistent with BPPV. Discussed treatment with medication and vestibular rehab.  Patient declined rehab but agreed to medication. Treating with Meclizine. Handout given for epley maneuver.

## 2015-09-11 NOTE — Progress Notes (Signed)
   Subjective:  Patient ID: Jaclyn RainierAngela Gaye Lin, female    DOB: 11/29/1965  Age: 50 y.o. MRN: [id number]  CC: Dizziness  HPI:  50 year old female presents to the clinic today with the above complaint.   Dizziness  Began on Saturday.   Described as "room spinning".  Lasts minutes and then resolves spontaneously.  Worse with movement/rising.  No known relieving factors.  No weakness, vision changes or other red flags.  Social Hx   Social History   Social History  . Marital Status: Married    Spouse Name: N/A  . Number of Children: N/A  . Years of Education: N/A   Social History Main Topics  . Smoking status: Current Every Day Smoker -- 0.50 packs/day for 20 years  . Smokeless tobacco: None  . Alcohol Use: Yes  . Drug Use: None  . Sexual Activity: Not Asked   Other Topics Concern  . None   Social History Narrative   Review of Systems  Constitutional: Negative.   Neurological: Positive for dizziness.   Objective:  BP 124/74 mmHg  Pulse 72  Temp(Src) 99 F (37.2 C) (Oral)  Ht 5' 5.5" (1.664 m)  Wt 172 lb 8 oz (78.245 kg)  BMI 28.26 kg/m2  SpO2 97%  BP/Weight 09/11/2015 09/04/2015 06/10/2015  Systolic BP 124 150 116  Diastolic BP 74 103 72  Wt. (Lbs) 172.5 175 175  BMI 28.26 28.26 28.26   Physical Exam  Constitutional: She is oriented to person, place, and time. She appears well-developed. No distress.  HENT:  Head: Normocephalic and atraumatic.  Mouth/Throat: Oropharynx is clear and moist.  Eyes: Conjunctivae are normal. Pupils are equal, round, and reactive to light.  Cardiovascular: Normal rate and regular rhythm.   Pulmonary/Chest: Effort normal.  Neurological: She is alert and oriented to person, place, and time. No cranial nerve deficit.  Normal muscle strength.  No focal deficits.  Vitals reviewed.   Lab Results  Component Value Date   WBC 6.5 01/27/2015   HGB 12.6 07/21/2014   HCT 36.4 01/27/2015   PLT 265 01/27/2015   GLUCOSE 110*  09/11/2015   CHOL 186 01/27/2015   TRIG 185* 01/27/2015   HDL 47 01/27/2015   LDLCALC 102* 01/27/2015   ALT 18 01/27/2015   AST 17 01/27/2015   NA 139 09/11/2015   K 4.0 09/11/2015   CL 101 09/11/2015   CREATININE 0.77 09/11/2015   BUN 13 09/11/2015   CO2 30 09/11/2015   TSH 1.850 01/27/2015   HGBA1C 6.2* 01/27/2015   MICROALBUR 1.3 07/21/2014    Assessment & Plan:   Problem List Items Addressed This Visit    None    Visit Diagnoses    Dizziness    -  Primary    Relevant Orders    Basic Metabolic Panel (BMET) (Completed)       Follow-up: PRN  Everlene OtherJayce Fumi Guadron DO [facility] [place]

## 2016-02-01 ENCOUNTER — Encounter: Payer: Self-pay | Admitting: Family

## 2016-02-01 ENCOUNTER — Ambulatory Visit (INDEPENDENT_AMBULATORY_CARE_PROVIDER_SITE_OTHER): Payer: 59 | Admitting: Family

## 2016-02-01 VITALS — BP 122/72 | HR 80 | Temp 98.7°F | Ht 66.0 in | Wt 171.2 lb

## 2016-02-01 DIAGNOSIS — F329 Major depressive disorder, single episode, unspecified: Secondary | ICD-10-CM

## 2016-02-01 DIAGNOSIS — Z0000 Encounter for general adult medical examination without abnormal findings: Secondary | ICD-10-CM

## 2016-02-01 DIAGNOSIS — F419 Anxiety disorder, unspecified: Secondary | ICD-10-CM | POA: Diagnosis not present

## 2016-02-01 DIAGNOSIS — Z0001 Encounter for general adult medical examination with abnormal findings: Secondary | ICD-10-CM | POA: Diagnosis not present

## 2016-02-01 DIAGNOSIS — F32A Depression, unspecified: Secondary | ICD-10-CM

## 2016-02-01 MED ORDER — TETANUS-DIPHTH-ACELL PERTUSSIS 5-2.5-18.5 LF-MCG/0.5 IM SUSP: 0.5000 mL | Freq: Once | INTRAMUSCULAR | 0 refills | Status: AC

## 2016-02-01 MED ORDER — MIRTAZAPINE 15 MG PO TABS: 15.0000 mg | ORAL_TABLET | Freq: Every day | ORAL | 2 refills | Status: DC

## 2016-02-01 NOTE — Assessment & Plan Note (Signed)
We've ordered colonoscopy and schedule mammogram today. She is up-to-date on her Pap. She is due for her tetanus however I printed a prescription for right aid to ensure that patient is not currently significant cost for this. Patient politely declined hepatitis C and HIV screening. Otherwise screening labs done today. Encouraged smoking cessation.

## 2016-02-01 NOTE — Assessment & Plan Note (Signed)
Trial of Remeron for depression with difficulty sleeping. At this point in time, patient gets a little sleep, we jointly agreed that a medication to help her get quality sleep may help both depression and anxiety. I discontinued Xanax as patient had not been taking. Discussed better management with daily medication such as Remeron for anxiety.

## 2016-02-01 NOTE — Progress Notes (Signed)
Subjective:    Patient ID: Jaclyn Lin, female    DOB: 1966/03/10, 50 y.o.   MRN: [id number]  CC: Jaclyn Lin is a 50 y.o. female who presents today for physical exam and to establish care.   HPI: Here for CPE and establish care.  Anxiety/Depression: comes and goes, she reports feeling anxious and depressed mood 2- 3 days a week.  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 On Average gets 3-4 hours of sleep per night and then reports to work at 3 or 4am. She has been on Xanax in the past. She is no thoughts of hurting herself or anyone else.    Colorectal Cancer Screening: Due Breast Cancer Screening: Mammogram Due; last in 2016 was normal.  Cervical Cancer Screening: 2016, negative malignancy and HPV  Immunizations       Tetanus - Due Hepatitis C screening - Candidate for; declines HIV Screening- Candidate for; declines Labs: Screening labs today. Exercise: Gets regular exercise.  Alcohol use: occasional.  Smoking/tobacco use: Smoker.  Regular dental exams: UTD Wears seat belt: Yes.  HISTORY:  Past Medical History:  Diagnosis Date  . Hypertension   . UTI (lower urinary tract infection)     Past Surgical History:  Procedure Laterality Date  . ABDOMINAL HYSTERECTOMY     partial, Dr. Haskel Khan  . VAGINAL DELIVERY     2   Family History  Problem Relation Age of Onset  . Hypertension Mother   . Cancer Maternal Grandfather     bone  . Asthma Maternal Grandmother   . Brain cancer Maternal Grandmother   . Asthma Son       ALLERGIES: Review of patient's allergies indicates no known allergies.  Current Outpatient Prescriptions on File Prior to Visit  Medication Sig Dispense Refill  . albuterol (PROVENTIL HFA;VENTOLIN HFA) 108 (90 BASE) MCG/ACT inhaler Inhale 1-2 puffs into the lungs every 6 (six) hours as needed for wheezing or shortness of breath. 1 Inhaler 0  . fluticasone (FLONASE)  50 MCG/ACT nasal spray Place 2 sprays into both nostrils daily. 16 g 6  . meclizine (ANTIVERT) 25 MG tablet Take 1 tablet (25 mg total) by mouth 3 (three) times daily as needed for dizziness. 30 tablet 0  . metFORMIN (GLUCOPHAGE) 500 MG tablet Take 1 tablet (500 mg total) by mouth 2 (two) times daily with a meal. 180 tablet 3  . omeprazole (PRILOSEC) 40 MG capsule Take 1 capsule (40 mg total) by mouth daily. 90 capsule 3  . triamterene-hydrochlorothiazide (DYAZIDE) 37.5-25 MG per capsule take 1 capsule by mouth once daily 90 capsule 3   No current facility-administered medications on file prior to visit.     Social History  Substance Use Topics  . Smoking status: Current Every Day Smoker    Packs/day: 0.50    Years: 20.00  . Smokeless tobacco: Never Used  . Alcohol use Yes     Comment: Beer on weekends; 6 pack    Review of Systems  Constitutional: Negative for chills, fever and unexpected weight change.  HENT: Negative for congestion.   Respiratory: Negative for cough.   Cardiovascular: Negative for chest pain, palpitations and leg swelling.  Gastrointestinal: Negative for nausea and vomiting.  Musculoskeletal: Negative for arthralgias and myalgias.  Skin: Negative for rash.  Neurological: Negative for headaches.  Hematological: Negative for adenopathy.  Psychiatric/Behavioral: Negative for confusion.      Objective:    BP  122/72 (BP Location: Left Arm, Patient Position: Sitting, Cuff Size: Large)   Pulse 80   Temp 98.7 F (37.1 C) (Oral)   Ht 5\' 6"  (1.676 m)   Wt 171 lb 3.2 oz (77.7 kg)   SpO2 98%   BMI 27.63 kg/m   BP Readings from Last 3 Encounters:  02/01/16 122/72  09/11/15 124/74  09/04/15 (!) 150/103   Wt Readings from Last 3 Encounters:  02/01/16 171 lb 3.2 oz (77.7 kg)  09/11/15 172 lb 8 oz (78.2 kg)  09/04/15 175 lb (79.4 kg)    Physical Exam  Constitutional: She appears well-developed and well-nourished.  Eyes: Conjunctivae are normal.  Neck: No  thyroid mass and no thyromegaly present.  Cardiovascular: Normal rate, regular rhythm, normal heart sounds and normal pulses.   Pulmonary/Chest: Effort normal and breath sounds normal. She has no wheezes. She has no rhonchi. She has no rales. Right breast exhibits no inverted nipple, no mass, no nipple discharge, no skin change and no tenderness. Left breast exhibits no inverted nipple, no mass, no nipple discharge, no skin change and no tenderness. Breasts are symmetrical.  Neurological: She is alert.  Skin: Skin is warm and dry.  Psychiatric: She has a normal mood and affect. Her speech is normal and behavior is normal. Thought content normal.  Vitals reviewed.      Assessment & Plan:   Problem List Items Addressed This Visit      Other   Anxiety    Trial of Remeron for depression with difficulty sleeping. At this point in time, patient gets a little sleep, we jointly agreed that a medication to help her get quality sleep may help both depression and anxiety. I discontinued Xanax as patient had not been taking. Discussed better management with daily medication such as Remeron for anxiety.      Relevant Medications   mirtazapine (REMERON) 15 MG tablet   Routine general medical examination at a health care facility    [facility] She is up-to-date on her Pap. She is due for her tetanus however I printed a prescription for right aid to ensure that patient is not currently significant cost for this. Patient politely declined hepatitis C and HIV screening. Otherwise screening labs done today. Encouraged smoking cessation.       Other Visit Diagnoses    Encounter for routine adult physical exam with abnormal findings    -  Primary   Relevant Medications   Tdap (BOOSTRIX) 5-2.5-18.5 LF-MCG/0.5 injection   Other Relevant Orders   CBC with Differential/Platelet   Comprehensive metabolic panel   Hemoglobin A1c   Lipid panel   Microalbumin /  creatinine urine ratio   TSH   VITAMIN D 25 Hydroxy (Vit-D Deficiency, Fractures)   Ambulatory referral to Gastroenterology   MM DIGITAL SCREENING BILATERAL   Depression       Relevant Medications   mirtazapine (REMERON) 15 MG tablet       I have discontinued Ms. Disbro's ALPRAZolam. I am also having her start on Tdap and mirtazapine. Additionally, I am having her maintain her fluticasone, metFORMIN, omeprazole, triamterene-hydrochlorothiazide, albuterol, meclizine, and metroNIDAZOLE.   Meds ordered this encounter  Medications  . metroNIDAZOLE (FLAGYL) 500 MG tablet    Sig: Take by mouth.  . Tdap (BOOSTRIX) 5-2.5-18.5 LF-MCG/0.5 injection    Sig: Inject 0.5 mLs into the muscle once.    Dispense:  0.5 mL    Refill:  0    Order Specific  Question:   Supervising Provider    Answer:   Sherlene Shams [[id number]]  . mirtazapine (REMERON) 15 MG tablet    Sig: Take 1 tablet (15 mg total) by mouth at bedtime.    Dispense:  30 tablet    Refill:  2    Order Specific Question:   Supervising Provider    Answer:   Sherlene Shams [[id number]]    Return precautions given.   Risks, benefits, and alternatives of the medications and treatment plan prescribed today were discussed, and patient expressed understanding.   Education regarding symptom management and diagnosis given to patient on AVS.   Continue to follow with Wynona Dove, MD for routine health maintenance.   Jaclyn Lin and I agreed with plan.   Rennie Plowman, FNP  Total of 25 minutes spent with patient, greater than 50% of which was spent in discussion of depression and anxiety.

## 2016-02-01 NOTE — Progress Notes (Signed)
Pre visit review using our clinic review tool, if applicable. No additional management support is needed unless otherwise documented below in the visit note. 

## 2016-02-01 NOTE — Patient Instructions (Signed)
Trial of remeron at bedtime.   If there is no improvement in your symptoms, or if there is any worsening of symptoms, or if you have any additional concerns, please return for re-evaluation; or, if we are closed, consider going to the Emergency Room for evaluation if symptoms urgent.  Health Maintenance, Female Adopting a healthy lifestyle and getting preventive care can go a long way to promote health and wellness. Talk with your health care provider about what schedule of regular examinations is right for you. This is a good chance for you to check in with your provider about disease prevention and staying healthy. In between checkups, there are plenty of things you can do on your own. Experts have done a lot of research about which lifestyle changes and preventive measures are most likely to keep you healthy. Ask your health care provider for more information. WEIGHT AND DIET  Eat a healthy diet  Be sure to include plenty of vegetables, fruits, low-fat dairy products, and lean protein.  Do not eat a lot of foods high in solid fats, added sugars, or salt.  Get regular exercise. This is one of the most important things you can do for your health.  Most adults should exercise for at least 150 minutes each week. The exercise should increase your heart rate and make you sweat (moderate-intensity exercise).  Most adults should also do strengthening exercises at least twice a week. This is in addition to the moderate-intensity exercise.  Maintain a healthy weight  Body mass index (BMI) is a measurement that can be used to identify possible weight problems. It estimates body fat based on height and weight. Your health care provider can help determine your BMI and help you achieve or maintain a healthy weight.  For females 8 years of age and older:   A BMI below 18.5 is considered underweight.  A BMI of 18.5 to 24.9 is normal.  A BMI of 25 to 29.9 is considered overweight.  A BMI of 30  and above is considered obese.  Watch levels of cholesterol and blood lipids  You should start having your blood tested for lipids and cholesterol at 50 years of age, then have this test every 5 years.  You may need to have your cholesterol levels checked more often if:  Your lipid or cholesterol levels are high.  You are older than 50 years of age.  You are at high risk for heart disease.  CANCER SCREENING   Lung Cancer  Lung cancer screening is recommended for adults 70-27 years old who are at high risk for lung cancer because of a history of smoking.  A yearly low-dose CT scan of the lungs is recommended for people who:  Currently smoke.  Have quit within the past 15 years.  Have at least a 30-pack-year history of smoking. A pack year is smoking an average of one pack of cigarettes a day for 1 year.  Yearly screening should continue until it has been 15 years since you quit.  Yearly screening should stop if you develop a health problem that would prevent you from having lung cancer treatment.  Breast Cancer  Practice breast self-awareness. This means understanding how your breasts normally appear and feel.  It also means doing regular breast self-exams. Let your health care provider know about any changes, no matter how small.  If you are in your 20s or 30s, you should have a clinical breast exam (CBE) by a health care provider every 1-3  years as part of a regular health exam.  If you are 80 or older, have a CBE every year. Also consider having a breast X-ray (mammogram) every year.  If you have a family history of breast cancer, talk to your health care provider about genetic screening.  If you are at high risk for breast cancer, talk to your health care provider about having an MRI and a mammogram every year.  Breast cancer gene (BRCA) assessment is recommended for women who have family members with BRCA-related cancers. BRCA-related cancers  include:  Breast.  Ovarian.  Tubal.  Peritoneal cancers.  Results of the assessment will determine the need for genetic counseling and BRCA1 and BRCA2 testing. Cervical Cancer Your health care provider may recommend that you be screened regularly for cancer of the pelvic organs (ovaries, uterus, and vagina). This screening involves a pelvic examination, including checking for microscopic changes to the surface of your cervix (Pap test). You may be encouraged to have this screening done every 3 years, beginning at age 21.  For women ages 22-65, health care providers may recommend pelvic exams and Pap testing every 3 years, or they may recommend the Pap and pelvic exam, combined with testing for human papilloma virus (HPV), every 5 years. Some types of HPV increase your risk of cervical cancer. Testing for HPV may also be done on women of any age with unclear Pap test results.  Other health care providers may not recommend any screening for nonpregnant women who are considered low risk for pelvic cancer and who do not have symptoms. Ask your health care provider if a screening pelvic exam is right for you.  If you have had past treatment for cervical cancer or a condition that could lead to cancer, you need Pap tests and screening for cancer for at least 20 years after your treatment. If Pap tests have been discontinued, your risk factors (such as having a new sexual partner) need to be reassessed to determine if screening should resume. Some women have medical problems that increase the chance of getting cervical cancer. In these cases, your health care provider may recommend more frequent screening and Pap tests. Colorectal Cancer  This type of cancer can be detected and often prevented.  Routine colorectal cancer screening usually begins at 50 years of age and continues through 50 years of age.  Your health care provider may recommend screening at an earlier age if you have risk factors for  colon cancer.  Your health care provider may also recommend using home test kits to check for hidden blood in the stool.  A small camera at the end of a tube can be used to examine your colon directly (sigmoidoscopy or colonoscopy). This is done to check for the earliest forms of colorectal cancer.  Routine screening usually begins at age 7.  Direct examination of the colon should be repeated every 5-10 years through 50 years of age. However, you may need to be screened more often if early forms of precancerous polyps or small growths are found. Skin Cancer  Check your skin from head to toe regularly.  Tell your health care provider about any new moles or changes in moles, especially if there is a change in a mole's shape or color.  Also tell your health care provider if you have a mole that is larger than the size of a pencil eraser.  Always use sunscreen. Apply sunscreen liberally and repeatedly throughout the day.  Protect yourself by wearing long  sleeves, pants, a wide-brimmed hat, and sunglasses whenever you are outside. HEART DISEASE, DIABETES, AND HIGH BLOOD PRESSURE   High blood pressure causes heart disease and increases the risk of stroke. High blood pressure is more likely to develop in:  People who have blood pressure in the high end of the normal range (130-139/85-89 mm Hg).  People who are overweight or obese.  People who are African American.  If you are 61-23 years of age, have your blood pressure checked every 3-5 years. If you are 72 years of age or older, have your blood pressure checked every year. You should have your blood pressure measured twice--once when you are at a hospital or clinic, and once when you are not at a hospital or clinic. Record the average of the two measurements. To check your blood pressure when you are not at a hospital or clinic, you can use:  An automated blood pressure machine at a pharmacy.  A home blood pressure monitor.  If you  are between 59 years and 56 years old, ask your health care provider if you should take aspirin to prevent strokes.  Have regular diabetes screenings. This involves taking a blood sample to check your fasting blood sugar level.  If you are at a normal weight and have a low risk for diabetes, have this test once every three years after 50 years of age.  If you are overweight and have a high risk for diabetes, consider being tested at a younger age or more often. PREVENTING INFECTION  Hepatitis B  If you have a higher risk for hepatitis B, you should be screened for this virus. You are considered at high risk for hepatitis B if:  You were born in a country where hepatitis B is common. Ask your health care provider which countries are considered high risk.  Your parents were born in a high-risk country, and you have not been immunized against hepatitis B (hepatitis B vaccine).  You have HIV or AIDS.  You use needles to inject street drugs.  You live with someone who has hepatitis B.  You have had sex with someone who has hepatitis B.  You get hemodialysis treatment.  You take certain medicines for conditions, including cancer, organ transplantation, and autoimmune conditions. Hepatitis C  Blood testing is recommended for:  Everyone born from [birth year] through 1965.  Anyone with known risk factors for hepatitis C. Sexually transmitted infections (STIs)  You should be screened for sexually transmitted infections (STIs) including gonorrhea and chlamydia if:  You are sexually active and are younger than 50 years of age.  You are older than 50 years of age and your health care provider tells you that you are at risk for this type of infection.  Your sexual activity has changed since you were last screened and you are at an increased risk for chlamydia or gonorrhea. Ask your health care provider if you are at risk.  If you do not have HIV, but are at risk, it may be recommended that you  take a prescription medicine daily to prevent HIV infection. This is called pre-exposure prophylaxis (PrEP). You are considered at risk if:  You are sexually active and do not regularly use condoms or know the HIV status of your partner(s).  You take drugs by injection.  You are sexually active with a partner who has HIV. Talk with your health care provider about whether you are at high risk of being infected with HIV. If you choose  to begin PrEP, you should first be tested for HIV. You should then be tested every 3 months for as long as you are taking PrEP.  PREGNANCY   If you are premenopausal and you may become pregnant, ask your health care provider about preconception counseling.  If you may become pregnant, take 400 to 800 micrograms (mcg) of folic acid every day.  If you want to prevent pregnancy, talk to your health care provider about birth control (contraception). OSTEOPOROSIS AND MENOPAUSE   Osteoporosis is a disease in which the bones lose minerals and strength with aging. This can result in serious bone fractures. Your risk for osteoporosis can be identified using a bone density scan.  If you are 34 years of age or older, or if you are at risk for osteoporosis and fractures, ask your health care provider if you should be screened.  Ask your health care provider whether you should take a calcium or vitamin D supplement to lower your risk for osteoporosis.  Menopause may have certain physical symptoms and risks.  Hormone replacement therapy may reduce some of these symptoms and risks. Talk to your health care provider about whether hormone replacement therapy is right for you.  HOME CARE INSTRUCTIONS   Schedule regular health, dental, and eye exams.  Stay current with your immunizations.   Do not use any tobacco products including cigarettes, chewing tobacco, or electronic cigarettes.  If you are pregnant, do not drink alcohol.  If you are breastfeeding, limit how  much and how often you drink alcohol.  Limit alcohol intake to no more than 1 drink per day for nonpregnant women. One drink equals 12 ounces of beer, 5 ounces of wine, or 1 ounces of hard liquor.  Do not use street drugs.  Do not share needles.  Ask your health care provider for help if you need support or information about quitting drugs.  Tell your health care provider if you often feel depressed.  Tell your health care provider if you have ever been abused or do not feel safe at home.   This information is not intended to replace advice given to you by your health care provider. Make sure you discuss any questions you have with your health care provider.   Document Released: 12/24/2010 Document Revised: 07/01/2014 Document Reviewed: 05/12/2013 Elsevier Interactive Patient Education 2016 Else[employer] Inc[employer]  Major Depressive Disorder Major depressive disorder is a mental illness. It also may be called clinical depression or unipolar depression. Major depressive disorder usually causes feelings of sadness, hopelessness, or helplessness. Some people with this disorder do not feel particularly sad but lose interest in doing things they used to enjoy (anhedonia). Major depressive disorder also can cause physical symptoms. It can interfere with work, school, relationships, and other normal everyday activities. The disorder varies in severity but is longer lasting and more serious than the sadness we all feel from time to time in our lives. Major depressive disorder often is triggered by stressful life events or major life changes. Examples of these triggers include divorce, loss of your job or home, a move, and the death of a family member or close friend. Sometimes this disorder occurs for no obvious reason at all. People who have family members with major depressive disorder or bipolar disorder are at higher risk for developing this disorder, with or without life stressors. Major depressive disorder  can occur at any age. It may occur just once in your life (single episode major depressive disorder). It may occur  multiple times (recurrent major depressive disorder). SYMPTOMS People with major depressive disorder have either anhedonia or depressed mood on nearly a daily basis for at least 2 weeks or longer. Symptoms of depressed mood include:  Feelings of sadness (blue or down in the dumps) or emptiness.  Feelings of hopelessness or helplessness.  Tearfulness or episodes of crying (may be observed by others).  Irritability (children and adolescents). In addition to depressed mood or anhedonia or both, people with this disorder have at least four of the following symptoms:  Difficulty sleeping or sleeping too much.   Significant change (increase or decrease) in appetite or weight.   Lack of energy or motivation.  Feelings of guilt and worthlessness.   Difficulty concentrating, remembering, or making decisions.  Unusually slow movement (psychomotor retardation) or restlessness (as observed by others).   Recurrent wishes for death, recurrent thoughts of self-harm (suicide), or a suicide attempt. People with major depressive disorder commonly have persistent negative thoughts about themselves, other people, and the world. People with severe major depressive disorder may experiencedistorted beliefs or perceptions about the world (psychotic delusions). They also may see or hear things that are not real (psychotic hallucinations). DIAGNOSIS Major depressive disorder is diagnosed through an assessment by your health care provider. Your health care provider will ask aboutaspects of your daily life, such as mood,sleep, and appetite, to see if you have the diagnostic symptoms of major depressive disorder. Your health care provider may ask about your medical history and use of alcohol or drugs, including prescription medicines. Your health care provider also may do a physical exam and blood  work. This is because certain medical conditions and the use of certain substances can cause major depressive disorder-like symptoms (secondary depression). Your health care provider also may refer you to a mental health specialist for further evaluation and treatment. TREATMENT It is important to recognize the symptoms of major depressive disorder and seek treatment. The following treatments can be prescribed for this disorder:   Medicine. Antidepressant medicines usually are prescribed. Antidepressant medicines are thought to correct chemical imbalances in the brain that are commonly associated with major depressive disorder. Other types of medicine may be added if the symptoms do not respond to antidepressant medicines alone or if psychotic delusions or hallucinations occur.  Talk therapy. Talk therapy can be helpful in treating major depressive disorder by providing support, education, and guidance. Certain types of talk therapy also can help with negative thinking (cognitive behavioral therapy) and with relationship issues that trigger this disorder (interpersonal therapy). A mental health specialist can help determine which treatment is best for you. Most people with major depressive disorder do well with a combination of medicine and talk therapy. Treatments involving electrical stimulation of the brain can be used in situations with extremely severe symptoms or when medicine and talk therapy do not work over time. These treatments include electroconvulsive therapy, transcranial magnetic stimulation, and vagal nerve stimulation.   This information is not intended to replace advice given to you by your health care provider. Make sure you discuss any questions you have with your health care provider.   Document Released: 10/05/2012 Document Revised: 07/01/2014 Document Reviewed: 10/05/2012 Elsevier Interactive Patient Education [employer].

## 2016-02-02 ENCOUNTER — Encounter: Payer: Self-pay | Admitting: Internal Medicine

## 2016-02-02 LAB — MICROALBUMIN / CREATININE URINE RATIO
Creatinine, Urine: 66.8 mg/dL
MICROALB/CREAT RATIO: 28.1 mg/g{creat} (ref 0.0–30.0)
Microalbumin, Urine: 18.8 ug/mL

## 2016-02-02 LAB — CBC WITH DIFFERENTIAL/PLATELET
Basophils Absolute: 0 x10E3/uL (ref 0.0–0.2)
Basos: 1 %
EOS (ABSOLUTE): 0.1 x10E3/uL (ref 0.0–0.4)
Eos: 2 %
Hematocrit: 40.2 % (ref 34.0–46.6)
Hemoglobin: 13 g/dL (ref 11.1–15.9)
Immature Grans (Abs): 0 x10E3/uL (ref 0.0–0.1)
Immature Granulocytes: 0 %
Lymphocytes Absolute: 2.8 x10E3/uL (ref 0.7–3.1)
Lymphs: 40 %
MCH: 27.3 pg (ref 26.6–33.0)
MCHC: 32.3 g/dL (ref 31.5–35.7)
MCV: 84 fL (ref 79–97)
Monocytes Absolute: 0.8 x10E3/uL (ref 0.1–0.9)
Monocytes: 11 %
Neutrophils Absolute: 3.2 x10E3/uL (ref 1.4–7.0)
Neutrophils: 46 %
Platelets: 227 x10E3/uL (ref 150–379)
RBC: 4.77 x10E6/uL (ref 3.77–5.28)
RDW: 14 % (ref 12.3–15.4)
WBC: 7 x10E3/uL (ref 3.4–10.8)

## 2016-02-02 LAB — COMPREHENSIVE METABOLIC PANEL WITH GFR
ALT: 12 IU/L (ref 0–32)
AST: 16 IU/L (ref 0–40)
Albumin/Globulin Ratio: 1.5 (ref 1.2–2.2)
Albumin: 4.5 g/dL (ref 3.5–5.5)
Alkaline Phosphatase: 57 IU/L (ref 39–117)
BUN/Creatinine Ratio: 17 (ref 9–23)
BUN: 11 mg/dL (ref 6–24)
Bilirubin Total: 0.4 mg/dL (ref 0.0–1.2)
CO2: 26 mmol/L (ref 18–29)
Calcium: 9.5 mg/dL (ref 8.7–10.2)
Chloride: 99 mmol/L (ref 96–106)
Creatinine, Ser: 0.66 mg/dL (ref 0.57–1.00)
GFR calc Af Amer: 119 mL/min/1.73 (ref >59)
GFR calc non Af Amer: 103 mL/min/1.73 (ref >59)
Globulin, Total: 3 g/dL (ref 1.5–4.5)
Glucose: 97 mg/dL (ref 65–99)
Potassium: 3.7 mmol/L (ref 3.5–5.2)
Sodium: 140 mmol/L (ref 134–144)
Total Protein: 7.5 g/dL (ref 6.0–8.5)

## 2016-02-02 LAB — TSH: TSH: 1.27 u[IU]/mL (ref 0.450–4.500)

## 2016-02-02 LAB — HEMOGLOBIN A1C
ESTIMATED AVERAGE GLUCOSE: 128 mg/dL
HEMOGLOBIN A1C: 6.1 % — AB (ref 4.8–5.6)

## 2016-02-02 LAB — LIPID PANEL
Chol/HDL Ratio: 3.5 ratio (ref 0.0–4.4)
Cholesterol, Total: 171 mg/dL (ref 100–199)
HDL: 49 mg/dL (ref >39)
LDL Calculated: 101 mg/dL — ABNORMAL HIGH (ref 0–99)
Triglycerides: 106 mg/dL (ref 0–149)
VLDL Cholesterol Cal: 21 mg/dL (ref 5–40)

## 2016-02-02 LAB — VITAMIN D 25 HYDROXY (VIT D DEFICIENCY, FRACTURES): VIT D 25 HYDROXY: 30.5 ng/mL (ref 30.0–100.0)

## 2016-02-06 ENCOUNTER — Encounter: Payer: Self-pay | Admitting: Family

## 2016-02-07 ENCOUNTER — Telehealth: Payer: Self-pay | Admitting: Internal Medicine

## 2016-02-07 NOTE — Telephone Encounter (Signed)
The patient is needing this medication refilled today . She was seen on August 10 by Arnett.   triamterene-hydrochlorothiazide (DYAZIDE) 37.5-25 MG per capsule

## 2016-02-07 NOTE — Telephone Encounter (Signed)
Medication refilled

## 2016-03-12 ENCOUNTER — Encounter: Payer: Self-pay | Admitting: Internal Medicine

## 2016-04-02 ENCOUNTER — Other Ambulatory Visit: Payer: Self-pay | Admitting: Family

## 2016-04-24 ENCOUNTER — Other Ambulatory Visit: Payer: Self-pay

## 2016-04-24 DIAGNOSIS — I10 Essential (primary) hypertension: Secondary | ICD-10-CM

## 2016-04-24 MED ORDER — TRIAMTERENE-HCTZ 37.5-25 MG PO CAPS: ORAL_CAPSULE | ORAL | 3 refills | Status: DC

## 2016-05-27 ENCOUNTER — Ambulatory Visit: Payer: 59 | Admitting: Family

## 2016-05-27 DIAGNOSIS — Z0289 Encounter for other administrative examinations: Secondary | ICD-10-CM

## 2016-05-27 NOTE — Progress Notes (Deleted)
   Subjective:    Patient ID: Jaclyn Lin, female    DOB: 10/05/1965, [age] y.o.   MRN: [id number]  CC: Jaclyn Lin is a 50 y.o. female who presents today for an acute visit.    HPI: CC: anxiety      HISTORY:  Past Medical History:  Diagnosis Date  . Hypertension   . UTI (lower urinary tract infection)    Past Surgical History:  Procedure Laterality Date  . ABDOMINAL HYSTERECTOMY     partial, Dr. Haskel KhanVanDalen  . VAGINAL DELIVERY     2   Family History  Problem Relation Age of Onset  . Hypertension Mother   . Cancer Maternal Grandfather     bone  . Asthma Maternal Grandmother   . Brain cancer Maternal Grandmother   . Asthma Son     Allergies: Patient has no known allergies. Current Outpatient Prescriptions on File Prior to Visit  Medication Sig Dispense Refill  . albuterol (PROVENTIL HFA;VENTOLIN HFA) 108 (90 BASE) MCG/ACT inhaler Inhale 1-2 puffs into the lungs every 6 (six) hours as needed for wheezing or shortness of breath. 1 Inhaler 0  . fluticasone (FLONASE) 50 MCG/ACT nasal spray Place 2 sprays into both nostrils daily. 16 g 6  . meclizine (ANTIVERT) 25 MG tablet Take 1 tablet (25 mg total) by mouth 3 (three) times daily as needed for dizziness. 30 tablet 0  . metFORMIN (GLUCOPHAGE) 500 MG tablet Take 1 tablet (500 mg total) by mouth 2 (two) times daily with a meal. 180 tablet 3  . mirtazapine (REMERON) 15 MG tablet Take 1 tablet (15 mg total) by mouth at bedtime. 30 tablet 2  . omeprazole (PRILOSEC) 40 MG capsule Take 1 capsule (40 mg total) by mouth daily. 90 capsule 3  . triamterene-hydrochlorothiazide (DYAZIDE) 37.5-25 MG capsule take 1 capsule by mouth once daily 90 capsule 3   No current facility-administered medications on file prior to visit.     Social History  Substance Use Topics  . Smoking status: Current Every Day Smoker    Packs/day: 0.50    Years: 20.00  . Smokeless tobacco: Never Used  . Alcohol use Yes     Comment: Beer on  weekends; 6 pack    Review of Systems    Objective:    There were no vitals taken for this visit.   Physical Exam     Assessment & Plan:      I am having [name] maintain her fluticasone, metFORMIN, omeprazole, albuterol, meclizine, mirtazapine, and triamterene-hydrochlorothiazide.   No orders of the defined types were placed in this encounter.   Return precautions given.   Risks, benefits, and alternatives of the medications and treatment plan prescribed today were discussed, and patient expressed understanding.   Education regarding symptom management and diagnosis given to patient on AVS.  Continue to follow with [name], FNP for routine health maintenance.   Jaclyn Lin and I agreed with plan.   Rennie PlowmanMargaret Jowanna Loeffler, FNP

## 2016-05-28 ENCOUNTER — Ambulatory Visit (INDEPENDENT_AMBULATORY_CARE_PROVIDER_SITE_OTHER): Payer: 59 | Admitting: Family

## 2016-05-28 ENCOUNTER — Encounter: Payer: Self-pay | Admitting: Family

## 2016-05-28 ENCOUNTER — Telehealth: Payer: Self-pay | Admitting: Family

## 2016-05-28 VITALS — BP 146/88 | HR 105 | Temp 98.5°F | Ht 66.0 in | Wt 164.6 lb

## 2016-05-28 DIAGNOSIS — F418 Other specified anxiety disorders: Secondary | ICD-10-CM | POA: Diagnosis not present

## 2016-05-28 DIAGNOSIS — F329 Major depressive disorder, single episode, unspecified: Secondary | ICD-10-CM

## 2016-05-28 DIAGNOSIS — F419 Anxiety disorder, unspecified: Secondary | ICD-10-CM | POA: Diagnosis not present

## 2016-05-28 MED ORDER — ALPRAZOLAM 0.25 MG PO TABS: 0.2500 mg | ORAL_TABLET | Freq: Two times a day (BID) | ORAL | 0 refills | Status: DC | PRN

## 2016-05-28 MED ORDER — SERTRALINE HCL 50 MG PO TABS: 50.0000 mg | ORAL_TABLET | Freq: Every day | ORAL | 1 refills | Status: DC

## 2016-05-28 NOTE — Assessment & Plan Note (Signed)
Worsening. Start zoloft. PRN xanax. Discussed risks of BZDs and patient agreed with long term plan of decreasing/discontinuing however at this time, we agreed to start until stress settles.  I looked up patient on [place] Controlled Substances Reporting System and saw no activity that raised concern of inappropriate use. Pending FMLA paperwork once patient brings to office. Referral to counseling as suspect PTSD is also playing a role from earlier son's death. F/u 6-8 weeks and PRN.

## 2016-05-28 NOTE — Telephone Encounter (Signed)
Pt called and would like M. Arnett to call her back. Thank you!  Call pt @ [phone]

## 2016-05-28 NOTE — Progress Notes (Signed)
Subjective:    Patient ID: Jaclyn Lin, female    DOB: 04/10/1966, 50 y.o.   MRN: [id number]  CC: Jaclyn Lin is a 50 y.o. female who presents today for an acute visit.    HPI: CC: Anxiety , depression.   Son involved in recent crime last week and she is under a lot of stress. Feels a lot of judgement as a parent. Son's father is not very involved.  Still has anxiety from loosing other son years ago from gun. Has family support but feels 'she is the strong one' and is 'tired of being strong.'  No thoughts of hurting herself or anyone else.  Endorses heart racing, trouble sleeping, 'trembly' from anxiety. Denies exertional chest pain or pressure, numbness or tingling radiating to left arm or jaw, palpitations, dizziness, frequent headaches, changes in vision, or shortness of breath.   Will bring FMLA paperwork by the office    HISTORY:  Past Medical History:  Diagnosis Date  . Hypertension   . UTI (lower urinary tract infection)    Past Surgical History:  Procedure Laterality Date  . ABDOMINAL HYSTERECTOMY     partial, Dr. Haskel KhanVanDalen  . VAGINAL DELIVERY     2   Family History  Problem Relation Age of Onset  . Hypertension Mother   . Cancer Maternal Grandfather     bone  . Asthma Maternal Grandmother   . Brain cancer Maternal Grandmother   . Asthma Son     Allergies: Codeine Current Outpatient Prescriptions on File Prior to Visit  Medication Sig Dispense Refill  . albuterol (PROVENTIL HFA;VENTOLIN HFA) 108 (90 BASE) MCG/ACT inhaler Inhale 1-2 puffs into the lungs every 6 (six) hours as needed for wheezing or shortness of breath. 1 Inhaler 0  . fluticasone (FLONASE) 50 MCG/ACT nasal spray Place 2 sprays into both nostrils daily. 16 g 6  . meclizine (ANTIVERT) 25 MG tablet Take 1 tablet (25 mg total) by mouth 3 (three) times daily as needed for dizziness. 30 tablet 0  . metFORMIN (GLUCOPHAGE) 500 MG tablet Take 1 tablet (500 mg total) by mouth 2 (two)  times daily with a meal. 180 tablet 3  . mirtazapine (REMERON) 15 MG tablet Take 1 tablet (15 mg total) by mouth at bedtime. 30 tablet 2  . omeprazole (PRILOSEC) 40 MG capsule Take 1 capsule (40 mg total) by mouth daily. 90 capsule 3  . triamterene-hydrochlorothiazide (DYAZIDE) 37.5-25 MG capsule take 1 capsule by mouth once daily 90 capsule 3   No current facility-administered medications on file prior to visit.     Social History  Substance Use Topics  . Smoking status: Current Every Day Smoker    Packs/day: 0.50    Years: 20.00  . Smokeless tobacco: Never Used  . Alcohol use Yes     Comment: Beer on weekends; 6 pack    Review of Systems  Constitutional: Negative for chills and fever.  Respiratory: Negative for cough.   Cardiovascular: Positive for palpitations. Negative for chest pain.  Gastrointestinal: Negative for nausea and vomiting.  Psychiatric/Behavioral: The patient is not nervous/anxious.       Objective:    BP (!) 146/88   Pulse (!) 105   Temp 98.5 F (36.9 C) (Oral)   Ht 5\' 6"  (1.676 m)   Wt 164 lb 9.6 oz (74.7 kg)   SpO2 99%   BMI 26.57 kg/m    Physical Exam  Constitutional: She appears well-developed and well-nourished.  Eyes: Conjunctivae are normal.  Cardiovascular: Normal rate, regular rhythm, normal heart sounds and normal pulses.   Pulmonary/Chest: Effort normal and breath sounds normal. She has no wheezes. She has no rhonchi. She has no rales.  Neurological: She is alert.  Skin: Skin is warm and dry.  Psychiatric: She has a normal mood and affect. Her speech is normal and behavior is normal. Thought content normal.  Vitals reviewed.      Assessment & Plan:      I am having Ms. Stoker start on sertraline. I am also having her maintain her fluticasone, metFORMIN, omeprazole, albuterol, meclizine, mirtazapine, triamterene-hydrochlorothiazide, and ALPRAZolam.   Meds ordered this encounter  Medications  . ALPRAZolam (XANAX) 0.25 MG  tablet    Sig: Take 1 tablet (0.25 mg total) by mouth 2 (two) times daily as needed for sleep or anxiety.    Dispense:  60 tablet    Refill:  0    Order Specific Question:   Supervising Provider    Answer:   Duncan DullULLO, TERESA L [[id number]]  . sertraline (ZOLOFT) 50 MG tablet    Sig: Take 1 tablet (50 mg total) by mouth at bedtime.    Dispense:  90 tablet    Refill:  1    Order Specific Question:   Supervising Provider    Answer:   Sherlene ShamsULLO, TERESA L [[id number]]    Return precautions given.   Risks, benefits, and alternatives of the medications and treatment plan prescribed today were discussed, and patient expressed understanding.   Education regarding symptom management and diagnosis given to patient on AVS.  Continue to follow with Rennie PlowmanMargaret Arnett, FNP for routine health maintenance.   Jaclyn Lin and I agreed with plan.   Rennie PlowmanMargaret Arnett, FNP Total of 25 minutes spent with patient, greater than 50% of which was spent in discussion of

## 2016-05-28 NOTE — Telephone Encounter (Signed)
Please advise 

## 2016-05-28 NOTE — Progress Notes (Signed)
Pre visit review using our clinic review tool, if applicable. No additional management support is needed unless otherwise documented below in the visit note. 

## 2016-05-28 NOTE — Patient Instructions (Addendum)
My heart is heavy for you and please know that I will be thinking of you daily and rooting for you. Hold you head up.   Please let me know there is anything else I can do for you. Please start trial of Zoloft at bedtime. I'm starting a low-dose and you may feel that increasing from 50 mg is necessary, you may call or sent me a MyChart message to let me know.   Otherwise, please schedule follow-up in 6-8 weeks to ensure that the Zoloft, Xanax is working for you.   Please also be mindful that using Xanax sparingly as it is not a good long-term solution however right now I know it is something that will help you- I just want you to be cautious.   Claris CheMargaret

## 2016-05-29 NOTE — Telephone Encounter (Signed)
Left message   If calls back please get more info

## 2016-05-30 ENCOUNTER — Telehealth: Payer: Self-pay | Admitting: Family

## 2016-05-30 NOTE — Telephone Encounter (Signed)
Pt came in and drop off FMLA paperwork. In color folder up front. Thank you!

## 2016-05-30 NOTE — Telephone Encounter (Signed)
Paperwork is on [employer] desk in folder.

## 2016-06-03 NOTE — Telephone Encounter (Signed)
Patient has paperwork up front for pick up. Patient stated she will pick up paperwork today or tomorrow.

## 2016-06-20 ENCOUNTER — Encounter: Payer: Self-pay | Admitting: Family

## 2016-06-20 ENCOUNTER — Ambulatory Visit (INDEPENDENT_AMBULATORY_CARE_PROVIDER_SITE_OTHER): Payer: 59 | Admitting: Family

## 2016-06-20 VITALS — BP 128/80 | HR 77 | Temp 98.0°F | Ht 66.0 in | Wt 161.4 lb

## 2016-06-20 DIAGNOSIS — F32A Depression, unspecified: Secondary | ICD-10-CM

## 2016-06-20 DIAGNOSIS — F419 Anxiety disorder, unspecified: Secondary | ICD-10-CM

## 2016-06-20 DIAGNOSIS — F418 Other specified anxiety disorders: Secondary | ICD-10-CM | POA: Diagnosis not present

## 2016-06-20 DIAGNOSIS — F329 Major depressive disorder, single episode, unspecified: Secondary | ICD-10-CM

## 2016-06-20 MED ORDER — ESCITALOPRAM OXALATE 10 MG PO TABS: 10.0000 mg | ORAL_TABLET | Freq: Every day | ORAL | 1 refills | Status: DC

## 2016-06-20 MED ORDER — ALPRAZOLAM 0.25 MG PO TABS: 0.2500 mg | ORAL_TABLET | Freq: Two times a day (BID) | ORAL | 0 refills | Status: DC | PRN

## 2016-06-20 NOTE — Progress Notes (Signed)
Subjective:    Patient ID: Jaclyn Lin, female    DOB: 03/06/1966, 50 y.o.   MRN: [id number]  CC: Jaclyn Lin is a 50 y.o. female who presents today for follow up.   HPI: CC: anxiety worsening past couple of weeks. Stress with son.  Unable to take zoloft due to HA and has taken herself of of it. Uses xanax once in the morning and once at night. Sleeping 4-5 hours.   No thoughts of hurting herself or anyone else.       HISTORY:  Past Medical History:  Diagnosis Date  . Hypertension   . UTI (lower urinary tract infection)    Past Surgical History:  Procedure Laterality Date  . ABDOMINAL HYSTERECTOMY     partial, Dr. Haskel KhanVanDalen  . VAGINAL DELIVERY     2   Family History  Problem Relation Age of Onset  . Hypertension Mother   . Cancer Maternal Grandfather     bone  . Asthma Maternal Grandmother   . Brain cancer Maternal Grandmother   . Asthma Son     Allergies: Codeine Current Outpatient Prescriptions on File Prior to Visit  Medication Sig Dispense Refill  . albuterol (PROVENTIL HFA;VENTOLIN HFA) 108 (90 BASE) MCG/ACT inhaler Inhale 1-2 puffs into the lungs every 6 (six) hours as needed for wheezing or shortness of breath. 1 Inhaler 0  . fluticasone (FLONASE) 50 MCG/ACT nasal spray Place 2 sprays into both nostrils daily. 16 g 6  . meclizine (ANTIVERT) 25 MG tablet Take 1 tablet (25 mg total) by mouth 3 (three) times daily as needed for dizziness. 30 tablet 0  . metFORMIN (GLUCOPHAGE) 500 MG tablet Take 1 tablet (500 mg total) by mouth 2 (two) times daily with a meal. 180 tablet 3  . mirtazapine (REMERON) 15 MG tablet Take 1 tablet (15 mg total) by mouth at bedtime. 30 tablet 2  . omeprazole (PRILOSEC) 40 MG capsule Take 1 capsule (40 mg total) by mouth daily. 90 capsule 3  . triamterene-hydrochlorothiazide (DYAZIDE) 37.5-25 MG capsule take 1 capsule by mouth once daily 90 capsule 3   No current facility-administered medications on file prior to visit.      Social History  Substance Use Topics  . Smoking status: Current Every Day Smoker    Packs/day: 0.50    Years: 20.00  . Smokeless tobacco: Never Used  . Alcohol use Yes     Comment: Beer on weekends; 6 pack    Review of Systems  Constitutional: Negative for chills and fever.  Respiratory: Negative for cough.   Cardiovascular: Negative for chest pain and palpitations.  Gastrointestinal: Negative for nausea and vomiting.  Psychiatric/Behavioral: Positive for sleep disturbance. Negative for suicidal ideas. The patient is nervous/anxious.       Objective:    BP 128/80   Pulse 77   Temp 98 F (36.7 C) (Oral)   Ht 5\' 6"  (1.676 m)   Wt 161 lb 6.4 oz (73.2 kg)   SpO2 98%   BMI 26.05 kg/m  BP Readings from Last 3 Encounters:  06/20/16 128/80  05/28/16 (!) 146/88  02/01/16 122/72   Wt Readings from Last 3 Encounters:  06/20/16 161 lb 6.4 oz (73.2 kg)  05/28/16 164 lb 9.6 oz (74.7 kg)  02/01/16 171 lb 3.2 oz (77.7 kg)    Physical Exam  Constitutional: She appears well-developed and well-nourished.  Eyes: Conjunctivae are normal.  Cardiovascular: Normal rate, regular rhythm, normal heart sounds and normal pulses.  Pulmonary/Chest: Effort normal and breath sounds normal. She has no wheezes. She has no rhonchi. She has no rales.  Neurological: She is alert.  Skin: Skin is warm and dry.  Psychiatric: She has a normal mood and affect. Her speech is normal and behavior is normal. Thought content normal.  Vitals reviewed.      Assessment & Plan:   Problem List Items Addressed This Visit      Other   Anxiety and depression - Primary    Worsening. Trial of lexapro. Refilled xanax. F/u 6-8 weeks, prn.       Relevant Medications   escitalopram (LEXAPRO) 10 MG tablet   ALPRAZolam (XANAX) 0.25 MG tablet    Other Visit Diagnoses    Anxiety       Relevant Medications   escitalopram (LEXAPRO) 10 MG tablet   ALPRAZolam (XANAX) 0.25 MG tablet       I have  discontinued Ms. Kreger's sertraline. I am also having her start on escitalopram. Additionally, I am having her maintain her fluticasone, metFORMIN, omeprazole, albuterol, meclizine, mirtazapine, triamterene-hydrochlorothiazide, and ALPRAZolam.   Meds ordered this encounter  Medications  . escitalopram (LEXAPRO) 10 MG tablet    Sig: Take 1 tablet (10 mg total) by mouth daily.    Dispense:  30 tablet    Refill:  1    Order Specific Question:   Supervising Provider    Answer:   Duncan DullULLO, TERESA L [[id number]]  . ALPRAZolam (XANAX) 0.25 MG tablet    Sig: Take 1 tablet (0.25 mg total) by mouth 2 (two) times daily as needed for sleep or anxiety.    Dispense:  60 tablet    Refill:  0    Fill after 06/27/2016    Order Specific Question:   Supervising Provider    Answer:   Sherlene ShamsULLO, TERESA L [[id number]]    Return precautions given.   Risks, benefits, and alternatives of the medications and treatment plan prescribed today were discussed, and patient expressed understanding.   Education regarding symptom management and diagnosis given to patient on AVS.  Continue to follow with Rennie PlowmanMargaret Arnett, FNP for routine health maintenance.   Jaclyn Lin and I agreed with plan.   Rennie PlowmanMargaret Arnett, FNP

## 2016-06-20 NOTE — Patient Instructions (Signed)
Trial lexapro.   Rooting for you.  Let me know if you need anything

## 2016-06-20 NOTE — Progress Notes (Signed)
Pre visit review using our clinic review tool, if applicable. No additional management support is needed unless otherwise documented below in the visit note. 

## 2016-06-20 NOTE — Assessment & Plan Note (Signed)
Worsening. Trial of lexapro. Refilled xanax. F/u 6-8 weeks, prn.

## 2016-06-26 ENCOUNTER — Telehealth: Payer: Self-pay | Admitting: Family

## 2016-06-26 NOTE — Telephone Encounter (Signed)
Pt called back to follow up on the msg from this morning.   Thank you!

## 2016-06-26 NOTE — Telephone Encounter (Signed)
Pt called and asked if Jaclyn Lin to write her a note to return to work on 07/01/16. She isn't getting paid and would like to return to work before the 15th. Please advise, thank you!  Call pt @ [phone]

## 2016-06-27 NOTE — Telephone Encounter (Signed)
Note is awaiting for patient pick up with paperwork at front desk.

## 2016-06-27 NOTE — Telephone Encounter (Signed)
Would you write for patient ?  Please call her and ask where we need to send.   Thank you brock !

## 2016-06-27 NOTE — Telephone Encounter (Signed)
Yes also it will be placed in blue folder on desk. Patient also [facility] not [facility] via fax. I have faxed numerous times. Patient will also be picking up that paperwork with her return to work note.

## 2016-07-01 NOTE — Telephone Encounter (Signed)
Pt called back and stated that they cannot find their new fax number. Asked if you could fax it to the company and she will pick it up tomorrow.

## 2016-07-01 NOTE — Telephone Encounter (Signed)
[employer] has paperwork, had to fox something on it.

## 2016-07-01 NOTE — Telephone Encounter (Signed)
Faxed to company and has placed forms up front for pick up.

## 2016-08-13 ENCOUNTER — Other Ambulatory Visit: Payer: Self-pay | Admitting: Family

## 2016-08-13 DIAGNOSIS — F419 Anxiety disorder, unspecified: Secondary | ICD-10-CM

## 2016-08-19 ENCOUNTER — Other Ambulatory Visit: Payer: Self-pay | Admitting: Family

## 2016-08-19 DIAGNOSIS — F419 Anxiety disorder, unspecified: Secondary | ICD-10-CM

## 2016-09-04 ENCOUNTER — Other Ambulatory Visit: Payer: Self-pay | Admitting: Family

## 2016-09-30 ENCOUNTER — Other Ambulatory Visit: Payer: Self-pay

## 2016-09-30 DIAGNOSIS — F419 Anxiety disorder, unspecified: Secondary | ICD-10-CM

## 2016-09-30 DIAGNOSIS — Z720 Tobacco use: Secondary | ICD-10-CM

## 2016-09-30 MED ORDER — ALPRAZOLAM 0.25 MG PO TABS: 0.2500 mg | ORAL_TABLET | Freq: Two times a day (BID) | ORAL | 0 refills | Status: DC | PRN

## 2016-09-30 MED ORDER — ALBUTEROL SULFATE HFA 108 (90 BASE) MCG/ACT IN AERS: 1.0000 | INHALATION_SPRAY | Freq: Four times a day (QID) | RESPIRATORY_TRACT | 1 refills | Status: DC | PRN

## 2016-09-30 MED ORDER — ALBUTEROL SULFATE HFA 108 (90 BASE) MCG/ACT IN AERS: 1.0000 | INHALATION_SPRAY | Freq: Four times a day (QID) | RESPIRATORY_TRACT | 0 refills | Status: DC | PRN

## 2016-09-30 NOTE — Telephone Encounter (Signed)
Refill request for Xanax, last seen [id number], last filled [id number].  Please advise.

## 2017-02-10 ENCOUNTER — Encounter: Payer: Self-pay | Admitting: Family

## 2017-02-10 ENCOUNTER — Ambulatory Visit (INDEPENDENT_AMBULATORY_CARE_PROVIDER_SITE_OTHER): Payer: 59 | Admitting: Family

## 2017-02-10 VITALS — BP 130/76 | HR 77 | Temp 98.7°F | Resp 14 | Ht 66.0 in | Wt 157.8 lb

## 2017-02-10 DIAGNOSIS — F419 Anxiety disorder, unspecified: Secondary | ICD-10-CM

## 2017-02-10 DIAGNOSIS — K219 Gastro-esophageal reflux disease without esophagitis: Secondary | ICD-10-CM | POA: Insufficient documentation

## 2017-02-10 DIAGNOSIS — I10 Essential (primary) hypertension: Secondary | ICD-10-CM

## 2017-02-10 DIAGNOSIS — Z1231 Encounter for screening mammogram for malignant neoplasm of breast: Secondary | ICD-10-CM

## 2017-02-10 DIAGNOSIS — Z0000 Encounter for general adult medical examination without abnormal findings: Secondary | ICD-10-CM

## 2017-02-10 DIAGNOSIS — F32A Depression, unspecified: Secondary | ICD-10-CM

## 2017-02-10 DIAGNOSIS — F329 Major depressive disorder, single episode, unspecified: Secondary | ICD-10-CM | POA: Diagnosis not present

## 2017-02-10 DIAGNOSIS — Z1239 Encounter for other screening for malignant neoplasm of breast: Secondary | ICD-10-CM

## 2017-02-10 MED ORDER — OMEPRAZOLE 20 MG PO CPDR: 20.0000 mg | DELAYED_RELEASE_CAPSULE | Freq: Every day | ORAL | 1 refills | Status: DC

## 2017-02-10 NOTE — Assessment & Plan Note (Signed)
Well-controlled.  Continue current regimen. 

## 2017-02-10 NOTE — Patient Instructions (Addendum)
Labs today Tdap vaccine at local pharmacy Mammogram scheduled. Please ensure it is a 3D mammogram EGD and colonoscopy ordered   Health Maintenance, Female Adopting a healthy lifestyle and getting preventive care can go a long way to promote health and wellness. Talk with your health care provider about what schedule of regular examinations is right for you. This is a good chance for you to check in with your provider about disease prevention and staying healthy. In between checkups, there are plenty of things you can do on your own. Experts have done a lot of research about which lifestyle changes and preventive measures are most likely to keep you healthy. Ask your health care provider for more information. Weight and diet Eat a healthy diet  Be sure to include plenty of vegetables, fruits, low-fat dairy products, and lean protein.  Do not eat a lot of foods high in solid fats, added sugars, or salt.  Get regular exercise. This is one of the most important things you can do for your health. ? Most adults should exercise for at least 150 minutes each week. The exercise should increase your heart rate and make you sweat (moderate-intensity exercise). ? Most adults should also do strengthening exercises at least twice a week. This is in addition to the moderate-intensity exercise.  Maintain a healthy weight  Body mass index (BMI) is a measurement that can be used to identify possible weight problems. It estimates body fat based on height and weight. Your health care provider can help determine your BMI and help you achieve or maintain a healthy weight.  For females 47 years of age and older: ? A BMI below 18.5 is considered underweight. ? A BMI of 18.5 to 24.9 is normal. ? A BMI of 25 to 29.9 is considered overweight. ? A BMI of 30 and above is considered obese.  Watch levels of cholesterol and blood lipids  You should start having your blood tested for lipids and cholesterol at 51  years of age, then have this test every 5 years.  You may need to have your cholesterol levels checked more often if: ? Your lipid or cholesterol levels are high. ? You are older than 51 years of age. ? You are at high risk for heart disease.  Cancer screening Lung Cancer  Lung cancer screening is recommended for adults 39-70 years old who are at high risk for lung cancer because of a history of smoking.  A yearly low-dose CT scan of the lungs is recommended for people who: ? Currently smoke. ? Have quit within the past 15 years. ? Have at least a 30-pack-year history of smoking. A pack year is smoking an average of one pack of cigarettes a day for 1 year.  Yearly screening should continue until it has been 15 years since you quit.  Yearly screening should stop if you develop a health problem that would prevent you from having lung cancer treatment.  Breast Cancer  Practice breast self-awareness. This means understanding how your breasts normally appear and feel.  It also means doing regular breast self-exams. Let your health care provider know about any changes, no matter how small.  If you are in your 20s or 30s, you should have a clinical breast exam (CBE) by a health care provider every 1-3 years as part of a regular health exam.  If you are 45 or older, have a CBE every year. Also consider having a breast X-ray (mammogram) every year.  If you have  family history of breast cancer, talk to your health care provider about genetic screening.  If you are at high risk for breast cancer, talk to your health care provider about having an MRI and a mammogram every year.  Breast cancer gene (BRCA) assessment is recommended for women who have family members with BRCA-related cancers. BRCA-related cancers include: ? Breast. ? Ovarian. ? Tubal. ? Peritoneal cancers.  Results of the assessment will determine the need for genetic counseling and BRCA1 and BRCA2 testing.  Cervical  Cancer Your health care provider may recommend that you be screened regularly for cancer of the pelvic organs (ovaries, uterus, and vagina). This screening involves a pelvic examination, including checking for microscopic changes to the surface of your cervix (Pap test). You may be encouraged to have this screening done every 3 years, beginning at age 21.  For women ages 30-65, health care providers may recommend pelvic exams and Pap testing every 3 years, or they may recommend the Pap and pelvic exam, combined with testing for human papilloma virus (HPV), every 5 years. Some types of HPV increase your risk of cervical cancer. Testing for HPV may also be done on women of any age with unclear Pap test results.  Other health care providers may not recommend any screening for nonpregnant women who are considered low risk for pelvic cancer and who do not have symptoms. Ask your health care provider if a screening pelvic exam is right for you.  If you have had past treatment for cervical cancer or a condition that could lead to cancer, you need Pap tests and screening for cancer for at least 20 years after your treatment. If Pap tests have been discontinued, your risk factors (such as having a new sexual partner) need to be reassessed to determine if screening should resume. Some women have medical problems that increase the chance of getting cervical cancer. In these cases, your health care provider may recommend more frequent screening and Pap tests.  Colorectal Cancer  This type of cancer can be detected and often prevented.  Routine colorectal cancer screening usually begins at 50 years of age and continues through 51 years of age.  Your health care provider may recommend screening at an earlier age if you have risk factors for colon cancer.  Your health care provider may also recommend using home test kits to check for hidden blood in the stool.  A small camera at the end of a tube can be used to  examine your colon directly (sigmoidoscopy or colonoscopy). This is done to check for the earliest forms of colorectal cancer.  Routine screening usually begins at age 50.  Direct examination of the colon should be repeated every 5-10 years through 51 years of age. However, you may need to be screened more often if early forms of precancerous polyps or small growths are found.  Skin Cancer  Check your skin from head to toe regularly.  Tell your health care provider about any new moles or changes in moles, especially if there is a change in a mole's shape or color.  Also tell your health care provider if you have a mole that is larger than the size of a pencil eraser.  Always use sunscreen. Apply sunscreen liberally and repeatedly throughout the day.  Protect yourself by wearing long sleeves, pants, a wide-brimmed hat, and sunglasses whenever you are outside.  Heart disease, diabetes, and high blood pressure  High blood pressure causes heart disease and increases the risk of   stroke. High blood pressure is more likely to develop in: ? People who have blood pressure in the high end of the normal range (130-139/85-89 mm Hg). ? People who are overweight or obese. ? People who are African American.  If you are 18-39 years of age, have your blood pressure checked every 3-5 years. If you are 40 years of age or older, have your blood pressure checked every year. You should have your blood pressure measured twice-once when you are at a hospital or clinic, and once when you are not at a hospital or clinic. Record the average of the two measurements. To check your blood pressure when you are not at a hospital or clinic, you can use: ? An automated blood pressure machine at a pharmacy. ? A home blood pressure monitor.  If you are between 55 years and 79 years old, ask your health care provider if you should take aspirin to prevent strokes.  Have regular diabetes screenings. This involves taking a  blood sample to check your fasting blood sugar level. ? If you are at a normal weight and have a low risk for diabetes, have this test once every three years after 51 years of age. ? If you are overweight and have a high risk for diabetes, consider being tested at a younger age or more often. Preventing infection Hepatitis B  If you have a higher risk for hepatitis B, you should be screened for this virus. You are considered at high risk for hepatitis B if: ? You were born in a country where hepatitis B is common. Ask your health care provider which countries are considered high risk. ? Your parents were born in a high-risk country, and you have not been immunized against hepatitis B (hepatitis B vaccine). ? You have HIV or AIDS. ? You use needles to inject street drugs. ? You live with someone who has hepatitis B. ? You have had sex with someone who has hepatitis B. ? You get hemodialysis treatment. ? You take certain medicines for conditions, including cancer, organ transplantation, and autoimmune conditions.  Hepatitis C  Blood testing is recommended for: ? Everyone born from 1945 through 1965. ? Anyone with known risk factors for hepatitis C.  Sexually transmitted infections (STIs)  You should be screened for sexually transmitted infections (STIs) including gonorrhea and chlamydia if: ? You are sexually active and are younger than 51 years of age. ? You are older than 51 years of age and your health care provider tells you that you are at risk for this type of infection. ? Your sexual activity has changed since you were last screened and you are at an increased risk for chlamydia or gonorrhea. Ask your health care provider if you are at risk.  If you do not have HIV, but are at risk, it may be recommended that you take a prescription medicine daily to prevent HIV infection. This is called pre-exposure prophylaxis (PrEP). You are considered at risk if: ? You are sexually active and  do not regularly use condoms or know the HIV status of your partner(s). ? You take drugs by injection. ? You are sexually active with a partner who has HIV.  Talk with your health care provider about whether you are at high risk of being infected with HIV. If you choose to begin PrEP, you should first be tested for HIV. You should then be tested every 3 months for as long as you are taking PrEP. Pregnancy  If   If you are premenopausal and you may become pregnant, ask your health care provider about preconception counseling.  If you may become pregnant, take 400 to 800 micrograms (mcg) of folic acid every day.  If you want to prevent pregnancy, talk to your health care provider about birth control (contraception). Osteoporosis and menopause  Osteoporosis is a disease in which the bones lose minerals and strength with aging. This can result in serious bone fractures. Your risk for osteoporosis can be identified using a bone density scan.  If you are 39 years of age or older, or if you are at risk for osteoporosis and fractures, ask your health care provider if you should be screened.  Ask your health care provider whether you should take a calcium or vitamin D supplement to lower your risk for osteoporosis.  Menopause may have certain physical symptoms and risks.  Hormone replacement therapy may reduce some of these symptoms and risks. Talk to your health care provider about whether hormone replacement therapy is right for you. Follow these instructions at home:  Schedule regular health, dental, and eye exams.  Stay current with your immunizations.  Do not use any tobacco products including cigarettes, chewing tobacco, or electronic cigarettes.  If you are pregnant, do not drink alcohol.  If you are breastfeeding, limit how much and how often you drink alcohol.  Limit alcohol intake to no more than 1 drink per day for nonpregnant women. One drink equals 12 ounces of beer, 5 ounces of  wine, or 1 ounces of hard liquor.  Do not use street drugs.  Do not share needles.  Ask your health care provider for help if you need support or information about quitting drugs.  Tell your health care provider if you often feel depressed.  Tell your health care provider if you have ever been abused or do not feel safe at home. This information is not intended to replace advice given to you by your health care provider. Make sure you discuss any questions you have with your health care provider. Document Released: 12/24/2010 Document Revised: 11/16/2015 Document Reviewed: 03/14/2015 Elsevier Interactive Patient Education  [employer].

## 2017-02-10 NOTE — Progress Notes (Signed)
Subjective:    Patient ID: Jaclyn Lin, female    DOB: 1965/10/31, 51 y.o.   MRN: [id number]  CC: Jaclyn Lin is a 51 y.o. female who presents today for physical exam.    HPI: HTN- compliant with medication Denies exertional chest pain or pressure, numbness or tingling radiating to left arm or jaw, palpitations, dizziness, frequent headaches, changes in vision, or shortness of breath.    GAD-using xanax    GERD- takes prilosec as needed. No difficulty swallowing. Never had EGD.      Colorectal Cancer Screening: due Breast Cancer Screening: Mammogram due; ordered Cervical Cancer Screening: UTD normal 2016 Bone Health screening/DEXA for 65+: No increased fracture risk. Defer screening at this time. Lung Cancer Screening: Doesn't have 30 year pack year history and age > 55 years.       Tetanus - due        HIV Screening- Candidate for, declines Labs: Screening labs today. Exercise: Gets regular exercise.  Alcohol use: beer on weekends Smoking/tobacco use: Former smoker.  Regular dental exams: UTD Wears seat belt: Yes. Skin: no new skin lesions; no h/o skin cancer.   HISTORY:  Past Medical History:  Diagnosis Date  . Hypertension   . UTI (lower urinary tract infection)     Past Surgical History:  Procedure Laterality Date  . ABDOMINAL HYSTERECTOMY     partial, Dr. Haskel Khan  . VAGINAL DELIVERY     2   Family History  Problem Relation Age of Onset  . Hypertension Mother   . Cancer Maternal Grandfather        bone  . Asthma Maternal Grandmother   . Brain cancer Maternal Grandmother   . Asthma Son       ALLERGIES: Codeine  Current Outpatient Prescriptions on File Prior to Visit  Medication Sig Dispense Refill  . albuterol (PROVENTIL HFA;VENTOLIN HFA) 108 (90 Base) MCG/ACT inhaler Inhale 1-2 puffs into the lungs every 6 (six) hours as needed for wheezing or shortness of breath. 1 Inhaler 1  . ALPRAZolam (XANAX) 0.25 MG tablet Take 1  tablet (0.25 mg total) by mouth 2 (two) times daily as needed for sleep or anxiety. 60 tablet 0  . fluticasone (FLONASE) 50 MCG/ACT nasal spray Place 2 sprays into both nostrils daily. 16 g 6  . meclizine (ANTIVERT) 25 MG tablet Take 1 tablet (25 mg total) by mouth 3 (three) times daily as needed for dizziness. 30 tablet 0  . triamterene-hydrochlorothiazide (DYAZIDE) 37.5-25 MG capsule take 1 capsule by mouth once daily 90 capsule 3   No current facility-administered medications on file prior to visit.     Social History  Substance Use Topics  . Smoking status: Former Smoker    Packs/day: 0.50    Years: 20.00    Types: Cigarettes    Start date: 02/11/2016  . Smokeless tobacco: Never Used  . Alcohol use Yes     Comment: Beer on weekends; 6 pack    Review of Systems  Constitutional: Negative for chills, fever and unexpected weight change.  HENT: Negative for congestion.   Respiratory: Negative for cough.   Cardiovascular: Negative for chest pain, palpitations and leg swelling.  Gastrointestinal: Negative for nausea and vomiting.  Musculoskeletal: Negative for arthralgias and myalgias.  Skin: Negative for rash.  Neurological: Negative for headaches.  Hematological: Negative for adenopathy.  Psychiatric/Behavioral: Negative for confusion.      Objective:    BP 130/76 (BP Location: Left Arm, Patient Position: Sitting, Cuff Size:  Normal)   Pulse 77   Temp 98.7 F (37.1 C) (Oral)   Resp 14   Ht 5\' 6"  (1.676 m)   Wt 157 lb 12.8 oz (71.6 kg)   SpO2 98%   BMI 25.47 kg/m   BP Readings from Last 3 Encounters:  02/10/17 130/76  06/20/16 128/80  05/28/16 (!) 146/88   Wt Readings from Last 3 Encounters:  02/10/17 157 lb 12.8 oz (71.6 kg)  06/20/16 161 lb 6.4 oz (73.2 kg)  05/28/16 164 lb 9.6 oz (74.7 kg)    Physical Exam  Constitutional: She appears well-developed and well-nourished.  Eyes: Conjunctivae are normal.  Neck: No thyroid mass and no thyromegaly present.    Cardiovascular: Normal rate, regular rhythm, normal heart sounds and normal pulses.   Pulmonary/Chest: Effort normal and breath sounds normal. She has no wheezes. She has no rhonchi. She has no rales. Right breast exhibits no inverted nipple, no mass, no nipple discharge, no skin change and no tenderness. Left breast exhibits no inverted nipple, no mass, no nipple discharge, no skin change and no tenderness. Breasts are symmetrical.  CBE performed.   Lymphadenopathy:       Head (right side): No submental, no submandibular, no tonsillar, no preauricular, no posterior auricular and no occipital adenopathy present.       Head (left side): No submental, no submandibular, no tonsillar, no preauricular, no posterior auricular and no occipital adenopathy present.    She has no cervical adenopathy.       Right cervical: No superficial cervical, no deep cervical and no posterior cervical adenopathy present.      Left cervical: No superficial cervical, no deep cervical and no posterior cervical adenopathy present.    She has no axillary adenopathy.  Neurological: She is alert.  Skin: Skin is warm and dry.  Psychiatric: She has a normal mood and affect. Her speech is normal and behavior is normal. Thought content normal.  Vitals reviewed.      Assessment & Plan:   Problem List Items Addressed This Visit      Cardiovascular and Mediastinum   Hypertension    Well-controlled Continue current regimen.        Digestive   Gastroesophageal reflux disease    Controlled with prn PPI. Former smoker      Relevant Medications   omeprazole (PRILOSEC) 20 MG capsule     Other   Anxiety and depression    Anxiety is much improved which also happy to see. Patient uses Xanax very sparingly. Advised her if anxiety were to worsen, please let me know and we would consider an SSRI.      Routine general medical examination at a health care facility - Primary    CBE performed. Deferred pap in context of no  pelvic complaints and pap utd. Ordered colonoscopy AND EGD ( h/o gerd). Labs today. 3d mammogram scheduled for patient. Encouraged exercise.       Relevant Medications   omeprazole (PRILOSEC) 20 MG capsule   Other Relevant Orders   CBC with Differential/Platelet   Comprehensive metabolic panel   Lipid panel   TSH   VITAMIN D 25 Hydroxy (Vit-D Deficiency, Fractures)   Hemoglobin A1c   Ambulatory referral to Gastroenterology   MM SCREENING BREAST TOMO BILATERAL    Other Visit Diagnoses    Screening for breast cancer       Relevant Orders   MM DIGITAL SCREENING BILATERAL       I have discontinued Ms. Germain's  metFORMIN and escitalopram. I have also changed her omeprazole. Additionally, I am having her maintain her fluticasone, meclizine, triamterene-hydrochlorothiazide, albuterol, and ALPRAZolam.   Meds ordered this encounter  Medications  . omeprazole (PRILOSEC) 20 MG capsule    Sig: Take 1 capsule (20 mg total) by mouth daily.    Dispense:  90 capsule    Refill:  1    Order Specific Question:   Supervising Provider    Answer:   Sherlene Shams [[id number]]    Return precautions given.   Risks, benefits, and alternatives of the medications and treatment plan prescribed today were discussed, and patient expressed understanding.   Education regarding symptom management and diagnosis given to patient on AVS.   Continue to follow with Allegra Grana, FNP for routine health maintenance.   Jaclyn Lin and I agreed with plan.   Rennie Plowman, FNP

## 2017-02-10 NOTE — Assessment & Plan Note (Signed)
Controlled with prn PPI. Former smoker

## 2017-02-10 NOTE — Assessment & Plan Note (Signed)
Anxiety is much improved which also happy to see. Patient uses Xanax very sparingly. Advised her if anxiety were to worsen, please let me know and we would consider an SSRI.

## 2017-02-10 NOTE — Assessment & Plan Note (Signed)
CBE performed. Deferred pap in context of no pelvic complaints and pap utd. Ordered colonoscopy AND EGD ( h/o gerd). Labs today. 3d mammogram scheduled for patient. Encouraged exercise.

## 2017-02-10 NOTE — Progress Notes (Signed)
Pre-visit discussion using our clinic review tool. No additional management support is needed unless otherwise documented below in the visit note.  

## 2017-02-10 NOTE — Addendum Note (Signed)
Addended by: Penne Lash on: 02/10/2017 10:30 AM   Modules accepted: Orders

## 2017-02-14 LAB — COMPREHENSIVE METABOLIC PANEL
A/G RATIO: 1.4 (ref 1.2–2.2)
ALBUMIN: 4.3 g/dL (ref 3.5–5.5)
ALK PHOS: 51 IU/L (ref 39–117)
ALT: 10 IU/L (ref 0–32)
AST: 16 IU/L (ref 0–40)
BILIRUBIN TOTAL: 0.4 mg/dL (ref 0.0–1.2)
BUN / CREAT RATIO: 13 (ref 9–23)
BUN: 9 mg/dL (ref 6–24)
CHLORIDE: 102 mmol/L (ref 96–106)
CO2: 21 mmol/L (ref 20–29)
Calcium: 9.1 mg/dL (ref 8.7–10.2)
Creatinine, Ser: 0.71 mg/dL (ref 0.57–1.00)
GFR calc Af Amer: 114 mL/min/{1.73_m2} (ref >59)
GFR calc non Af Amer: 99 mL/min/{1.73_m2} (ref >59)
GLOBULIN, TOTAL: 3.1 g/dL (ref 1.5–4.5)
GLUCOSE: 106 mg/dL — AB (ref 65–99)
POTASSIUM: 4 mmol/L (ref 3.5–5.2)
SODIUM: 136 mmol/L (ref 134–144)
Total Protein: 7.4 g/dL (ref 6.0–8.5)

## 2017-02-14 LAB — HEMOGLOBIN A1C
Est. average glucose Bld gHb Est-mCnc: 111 mg/dL
HEMOGLOBIN A1C: 5.5 % (ref 4.8–5.6)

## 2017-02-14 LAB — TSH: TSH: 1.46 u[IU]/mL (ref 0.450–4.500)

## 2017-02-14 LAB — CBC
HEMOGLOBIN: 12.6 g/dL (ref 11.1–15.9)
Hematocrit: 39.1 % (ref 34.0–46.6)
MCH: 27.6 pg (ref 26.6–33.0)
MCHC: 32.2 g/dL (ref 31.5–35.7)
MCV: 86 fL (ref 79–97)
Platelets: 221 10*3/uL (ref 150–379)
RBC: 4.57 x10E6/uL (ref 3.77–5.28)
RDW: 14.2 % (ref 12.3–15.4)
WBC: 8.5 10*3/uL (ref 3.4–10.8)

## 2017-02-14 LAB — LIPID PANEL
CHOL/HDL RATIO: 2.8 ratio (ref 0.0–4.4)
Cholesterol, Total: 150 mg/dL (ref 100–199)
HDL: 54 mg/dL (ref >39)
LDL Calculated: 80 mg/dL (ref 0–99)
Triglycerides: 79 mg/dL (ref 0–149)
VLDL Cholesterol Cal: 16 mg/dL (ref 5–40)

## 2017-02-14 LAB — VITAMIN D 25 HYDROXY (VIT D DEFICIENCY, FRACTURES)

## 2017-02-21 ENCOUNTER — Encounter: Payer: Self-pay | Admitting: Gastroenterology

## 2017-03-13 ENCOUNTER — Ambulatory Visit
Admission: RE | Admit: 2017-03-13 | Discharge: 2017-03-13 | Disposition: A | Discharge status: Home / Self Care | Payer: 59 | Source: Ambulatory Visit | Attending: Family | Admitting: Family

## 2017-03-13 DIAGNOSIS — Z0000 Encounter for general adult medical examination without abnormal findings: Secondary | ICD-10-CM

## 2017-03-13 DIAGNOSIS — Z1231 Encounter for screening mammogram for malignant neoplasm of breast: Secondary | ICD-10-CM | POA: Insufficient documentation

## 2017-03-17 ENCOUNTER — Inpatient Hospital Stay
Admission: RE | Admit: 2017-03-17 | Discharge: 2017-03-17 | Disposition: A | Discharge status: Home / Self Care | Payer: Self-pay | Source: Ambulatory Visit | Attending: *Deleted | Admitting: *Deleted

## 2017-03-17 ENCOUNTER — Other Ambulatory Visit: Payer: Self-pay | Admitting: *Deleted

## 2017-03-17 DIAGNOSIS — Z9289 Personal history of other medical treatment: Secondary | ICD-10-CM

## 2017-04-01 ENCOUNTER — Other Ambulatory Visit: Payer: Self-pay | Admitting: Family

## 2017-04-01 DIAGNOSIS — F419 Anxiety disorder, unspecified: Secondary | ICD-10-CM

## 2017-04-03 NOTE — Telephone Encounter (Signed)
Last OV Was 8/20, LAst refill was 09/30/2016 #60 with no refills, please advise, thanks

## 2017-04-07 ENCOUNTER — Telehealth: Payer: Self-pay | Admitting: Family

## 2017-04-07 DIAGNOSIS — F419 Anxiety disorder, unspecified: Secondary | ICD-10-CM

## 2017-04-07 MED ORDER — ALPRAZOLAM 0.25 MG PO TABS: 0.2500 mg | ORAL_TABLET | Freq: Two times a day (BID) | ORAL | 1 refills | Status: DC | PRN

## 2017-04-07 NOTE — Telephone Encounter (Signed)
I looked up patient on [place] Controlled Substances Reporting System and saw no activity that raised concern of inappropriate use.   refilled 

## 2017-05-22 ENCOUNTER — Other Ambulatory Visit: Payer: Self-pay | Admitting: Family

## 2017-05-22 DIAGNOSIS — I10 Essential (primary) hypertension: Secondary | ICD-10-CM

## 2017-05-22 MED ORDER — TRIAMTERENE-HCTZ 37.5-25 MG PO CAPS: ORAL_CAPSULE | ORAL | 1 refills | Status: DC

## 2017-05-22 NOTE — Addendum Note (Signed)
Addended by: Amado CoeARMSTRONG, Daltin Crist N on: 05/22/2017 04:07 PM   Modules accepted: Orders

## 2017-05-22 NOTE — Telephone Encounter (Signed)
Pt having complaints of headache for the past 2 days. Pt currently rating pain at 5. Pt offered to make appt for evaluation, but pt states she thinks having her medication will improve her headache. [facility].

## 2017-05-22 NOTE — Telephone Encounter (Signed)
Copied from CRM [phone]. Topic: Quick Communication - Rx Refill/Question >> May 22, 2017  3:05 PM Viviann SpareWhite, Selina wrote: Has the patient contacted their pharmacy? Yes triamterene-hydrochlorothiazide (DYAZIDE) 37.5-25 MG capsule (Patient has been out for three days now and have a really bad headache).  (Agent: If no, request that the patient contact the pharmacy for the refill.) Preferred Pharmacy (with phone number or street name):   [facility] [place] [address] [place] [id number] Phone: [phone] Fax: [phone]   Agent: Please be advised that RX refills may take up to 48 hours. We ask that you follow-up with your pharmacy.

## 2017-05-22 NOTE — Telephone Encounter (Signed)
Attempted to contact pt regarding symptoms and medication refill request; left message on voicemail [phone] tocall MD office.

## 2017-07-15 ENCOUNTER — Encounter: Payer: Self-pay | Admitting: Family

## 2017-12-24 ENCOUNTER — Other Ambulatory Visit: Payer: Self-pay | Admitting: Family

## 2017-12-24 DIAGNOSIS — I10 Essential (primary) hypertension: Secondary | ICD-10-CM

## 2018-01-02 IMAGING — MG MM DIGITAL SCREENING BILAT W/ TOMO W/ CAD
8 of 13 series · 8 of 29 positions shown · non-contrast
Comparison: Previous exam(s).

CLINICAL DATA: Screening.

EXAM:
2D DIGITAL SCREENING BILATERAL MAMMOGRAM WITH CAD AND ADJUNCT TOMO

[L MLO (1 of 2)]
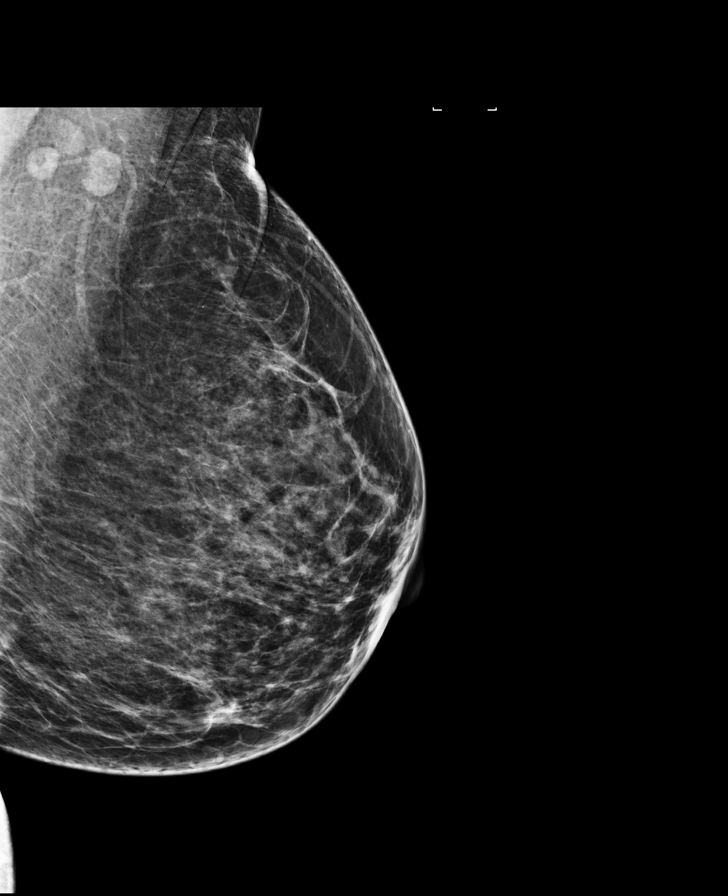

[L MLO (2 of 2)]
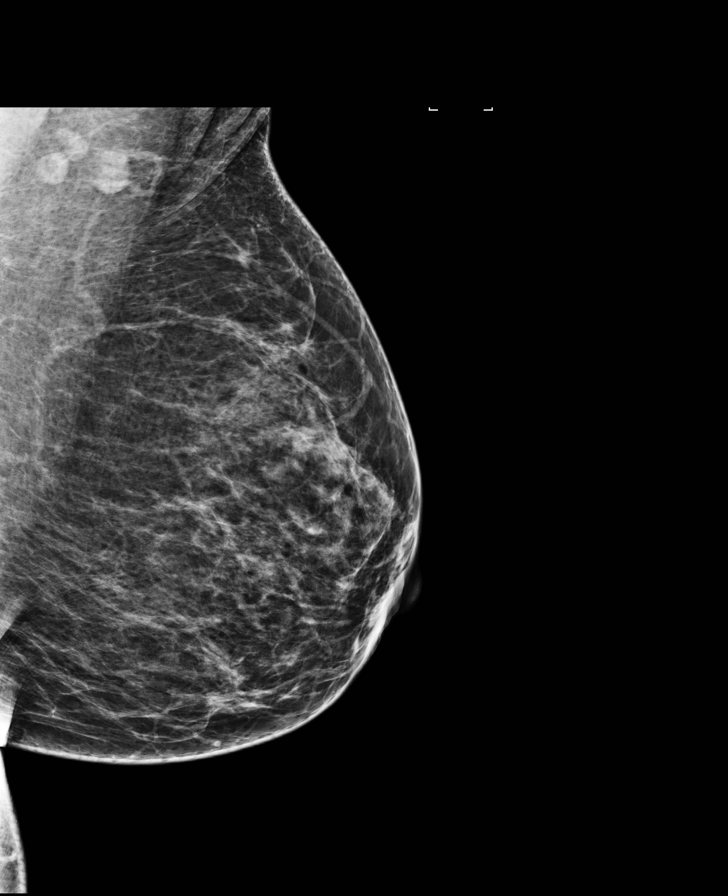

[R MLO synth-2D]
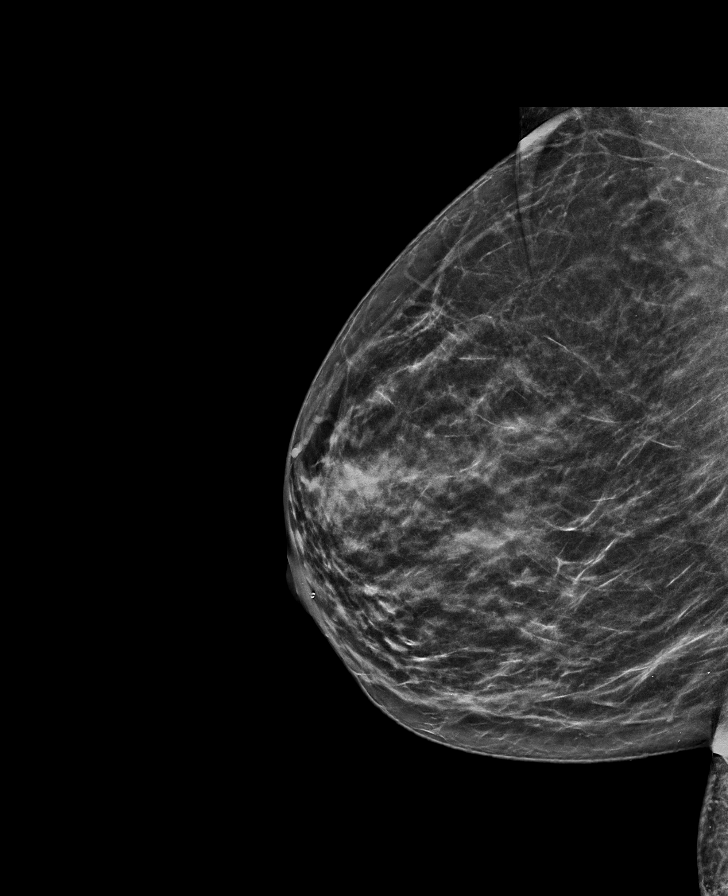

[L CC]
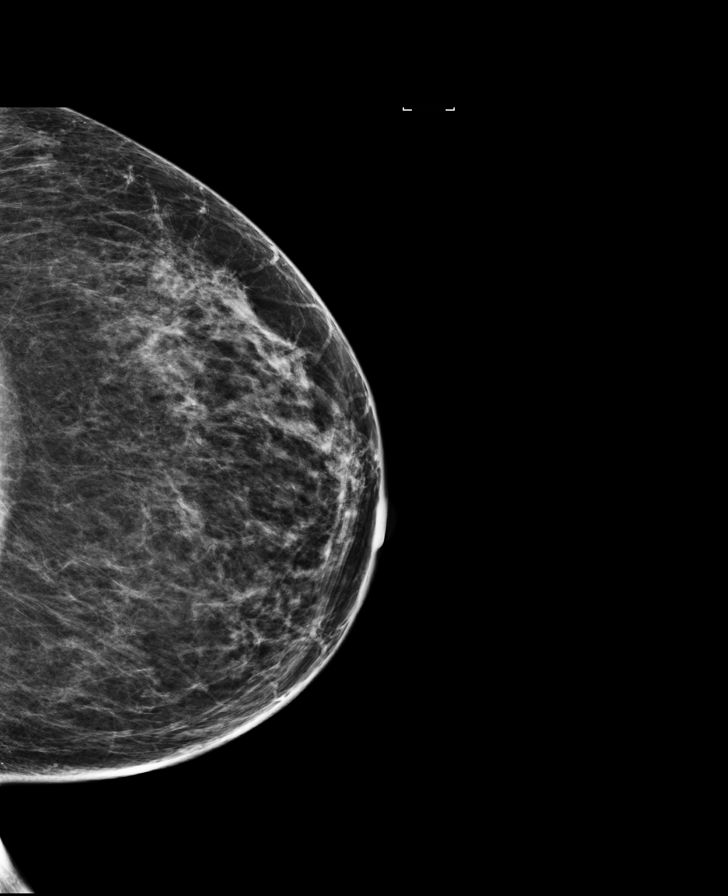

[L CC synth-2D]
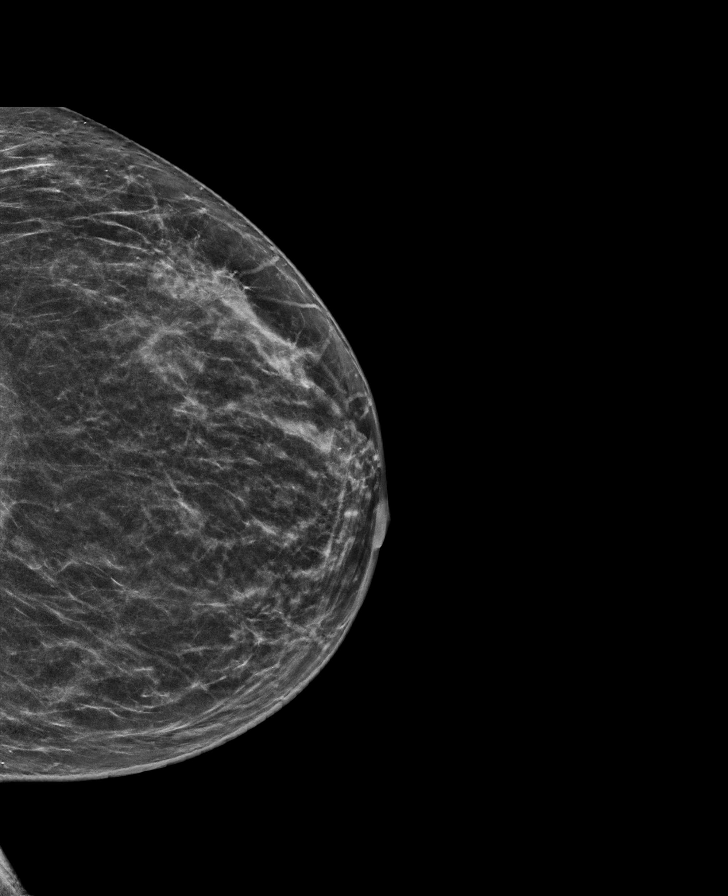

[R MLO]
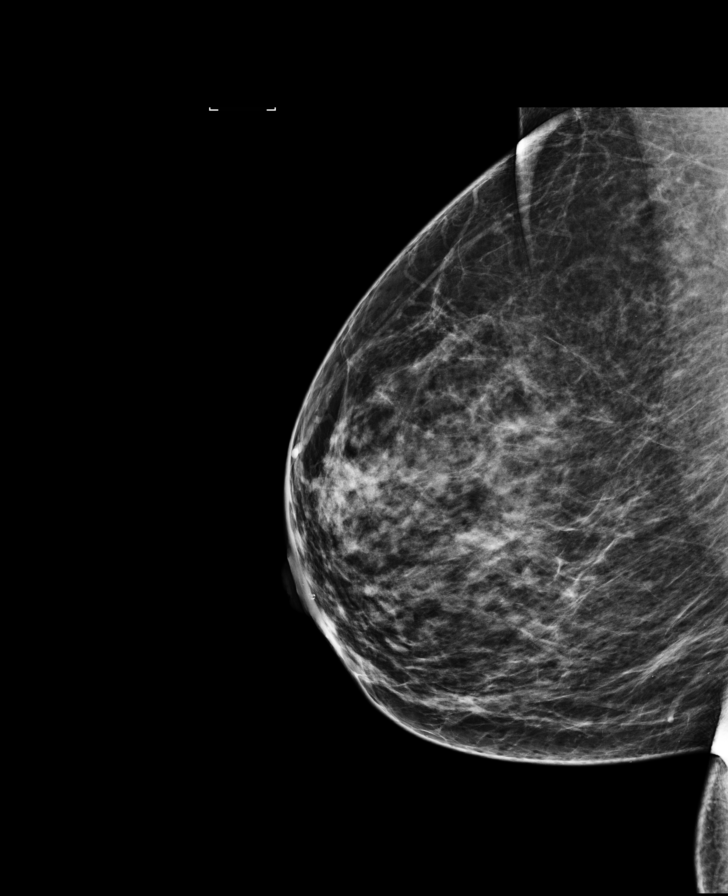

[R CC synth-2D]
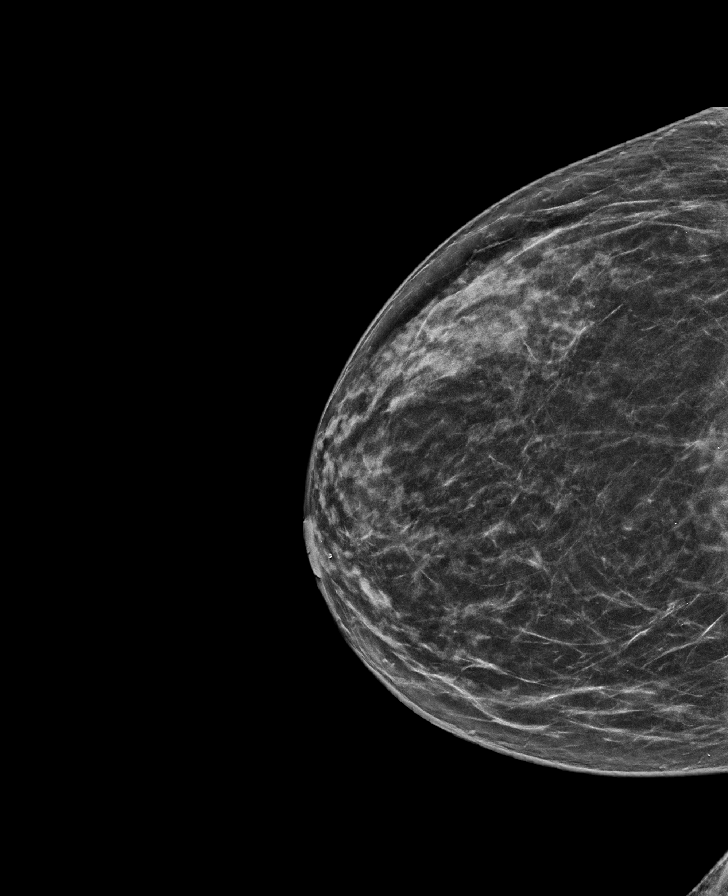

[L MLO synth-2D]
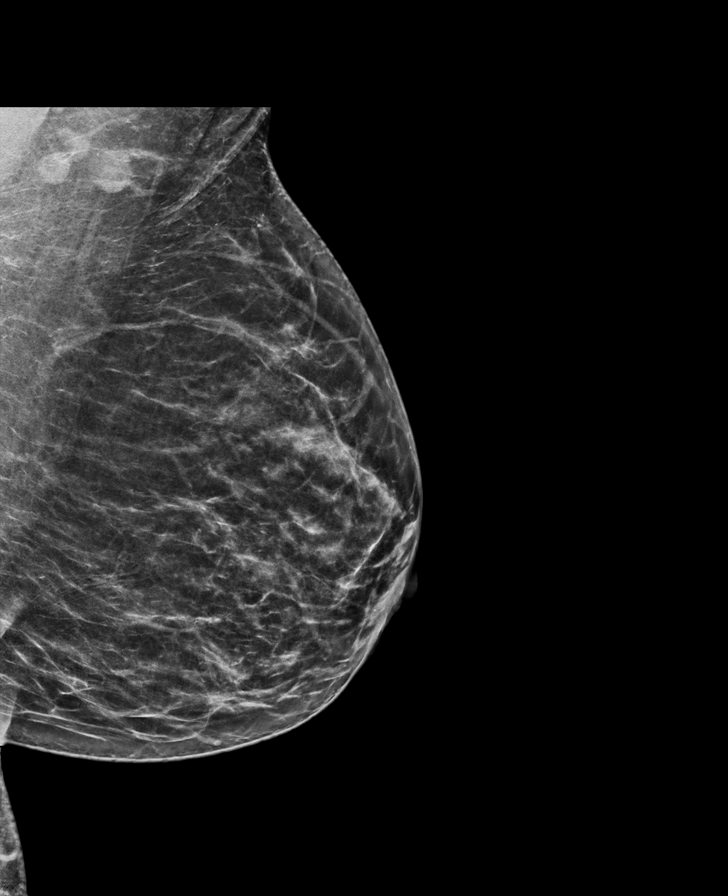

[8 of 29 positions shown; findings below may reference images not displayed]

ACR Breast Density Category c: The breast tissue is heterogeneously
dense, which may obscure small masses.
FINDINGS: There are no findings suspicious for malignancy. Images were
processed with CAD.
IMPRESSION: No mammographic evidence of malignancy. A result letter of this
screening mammogram will be mailed directly to the patient.

RECOMMENDATION:
Screening mammogram in one year. (Code:[id number])

BI-RADS CATEGORY  1: Negative.

## 2018-01-23 ENCOUNTER — Ambulatory Visit (INDEPENDENT_AMBULATORY_CARE_PROVIDER_SITE_OTHER): Payer: Managed Care, Other (non HMO) | Admitting: Family

## 2018-01-23 ENCOUNTER — Encounter: Payer: Self-pay | Admitting: Family

## 2018-01-23 VITALS — BP 120/82 | HR 79 | Temp 98.9°F | Resp 16 | Ht 66.0 in | Wt 173.5 lb

## 2018-01-23 DIAGNOSIS — F419 Anxiety disorder, unspecified: Secondary | ICD-10-CM

## 2018-01-23 DIAGNOSIS — M799 Soft tissue disorder, unspecified: Secondary | ICD-10-CM

## 2018-01-23 DIAGNOSIS — M7989 Other specified soft tissue disorders: Secondary | ICD-10-CM | POA: Insufficient documentation

## 2018-01-23 DIAGNOSIS — Z0000 Encounter for general adult medical examination without abnormal findings: Secondary | ICD-10-CM | POA: Diagnosis not present

## 2018-01-23 DIAGNOSIS — I10 Essential (primary) hypertension: Secondary | ICD-10-CM

## 2018-01-23 DIAGNOSIS — J45909 Unspecified asthma, uncomplicated: Secondary | ICD-10-CM | POA: Insufficient documentation

## 2018-01-23 DIAGNOSIS — K219 Gastro-esophageal reflux disease without esophagitis: Secondary | ICD-10-CM

## 2018-01-23 MED ORDER — SERTRALINE HCL 50 MG PO TABS: 50.0000 mg | ORAL_TABLET | Freq: Every day | ORAL | 3 refills | Status: DC

## 2018-01-23 MED ORDER — ALPRAZOLAM 0.25 MG PO TABS: 0.2500 mg | ORAL_TABLET | Freq: Two times a day (BID) | ORAL | 1 refills | Status: DC | PRN

## 2018-01-23 NOTE — Assessment & Plan Note (Signed)
resolved 

## 2018-01-23 NOTE — Assessment & Plan Note (Signed)
Clinical breast exam performed.  Pap smear is up-to-date;  in the absence of complaints today, pelvic exam was deferred.  Colonoscopy ordered which is due.

## 2018-01-23 NOTE — Assessment & Plan Note (Signed)
Worsened of late.  Patient using Xanax as needed.  Long discussion that she likely benefit more from SSRI.  Trial of Zoloft.  Follow-up in 6 weeks.

## 2018-01-23 NOTE — Patient Instructions (Signed)
Trial of zoloft at bedtime. We may need to increase  Use albuterol inhaler prior to going into cold office setting; let me know if no improvement.  Today we discussed referrals, orders. COLONOSCOPY   I have placed these orders in the system for you.  Please be sure to give us a call if you have not heard from our office regarding scheduling a test or regarding referral in a timely manner.  It is very important that you let me know as soon as possible.      We placed a referral for mammogram this year. I asked that you call one the below locations and schedule this when it is convenient for you.   As discussed, I would like you to ask for 3D mammogram over the traditional 2D mammogram as new evidence suggest 3D is superior.   Please note that NOT all insurance companies cover 3D and you may have to pay a higher copay. You may call your insurance company to further clarify your benefits.   Options for Mammogram.    [facility]  [address]  [place], [place]  [phone]  * [facility]   [facility] [address] [place], [place] [phone] * Note if you ask for 3D mammogram at this location, you must request [place], [place] location*

## 2018-01-23 NOTE — Progress Notes (Signed)
Subjective:    Patient ID: Jaclyn Lin, female    DOB: 11/10/1965, 53 y.o.   MRN: [id number]  CC: Jaclyn Lin is a 52 y.o. female who presents today for physical exam.    HPI: GAD- taking prn xanax. Feels like having panic attacks.   HTN- compliant with medications. Denies exertional chest pain or pressure, numbness or tingling radiating to left arm or jaw, palpitations, dizziness, frequent headaches, changes in vision, or shortness of breath.    Knot on left arm x one year, unchanged. Non tender.  No heat, drainage.   Asthma- wheezing lately. Uses albuterol prn. Used it once this week. Not SOB. Thinks a/c at home and at work triggers.   GERD resolved    Colorectal Cancer Screening: due; never went last year.  Breast Cancer Screening: Mammogram due Cervical Cancer Screening: per patient, done 2018 [facility]. Normal per patient.  Bone Health screening/DEXA for 65+: No increased fracture risk. Defer screening at this time. Lung Cancer Screening: Doesn't have 30 year pack year history and age > 55 years       Tetanus - UTD         Labs: Screening labs today. Exercise: Gets regular exercise.  Alcohol use: moderate Smoking/tobacco use: Former smoker.  Regular dental exams: UTD Wears seat belt: Yes. Skin: no h/o skin cancer. No new lesions.   HISTORY:  Past Medical History:  Diagnosis Date  . Hypertension   . UTI (lower urinary tract infection)     Past Surgical History:  Procedure Laterality Date  . ABDOMINAL HYSTERECTOMY     partial, Dr. Haskel Khan  . VAGINAL DELIVERY     2   Family History  Problem Relation Age of Onset  . Hypertension Mother   . Cancer Maternal Grandfather        bone  . Asthma Maternal Grandmother   . Brain cancer Maternal Grandmother   . Asthma Son       ALLERGIES: Codeine  Current Outpatient Medications on File Prior to Visit  Medication Sig Dispense Refill  . albuterol (PROVENTIL HFA;VENTOLIN HFA) 108 (90  Base) MCG/ACT inhaler Inhale 1-2 puffs into the lungs every 6 (six) hours as needed for wheezing or shortness of breath. 1 Inhaler 1  . fluticasone (FLONASE) 50 MCG/ACT nasal spray Place 2 sprays into both nostrils daily. 16 g 6  . meclizine (ANTIVERT) 25 MG tablet Take 1 tablet (25 mg total) by mouth 3 (three) times daily as needed for dizziness. 30 tablet 0  . triamterene-hydrochlorothiazide (DYAZIDE) 37.5-25 MG capsule take 1 capsule by mouth once daily 90 capsule 1   No current facility-administered medications on file prior to visit.     Social History   Tobacco Use  . Smoking status: Former Smoker    Packs/day: 0.50    Years: 20.00    Pack years: 10.00    Types: Cigarettes    Start date: 02/11/2016  . Smokeless tobacco: Never Used  Substance Use Topics  . Alcohol use: Yes    Comment: Beer on weekends; 6 pack  . Drug use: No    Review of Systems  Constitutional: Negative for chills, fever and unexpected weight change.  HENT: Negative for congestion, trouble swallowing and voice change.   Respiratory: Positive for wheezing. Negative for cough and shortness of breath.   Cardiovascular: Negative for chest pain, palpitations and leg swelling.  Gastrointestinal: Negative for nausea and vomiting.  Genitourinary: Negative for dysuria, vaginal discharge and vaginal pain.  Musculoskeletal: Negative for arthralgias and myalgias.  Skin: Negative for color change and rash.  Neurological: Negative for headaches.  Hematological: Negative for adenopathy.  Psychiatric/Behavioral: Negative for confusion.      Objective:    BP 120/82 (BP Location: Left Arm, Patient Position: Sitting, Cuff Size: Normal)   Pulse 79   Temp 98.9 F (37.2 C) (Oral)   Resp 16   Ht 5\' 6"  (1.676 m)   Wt 173 lb 8 oz (78.7 kg)   SpO2 97%   BMI 28.00 kg/m   BP Readings from Last 3 Encounters:  01/23/18 120/82  02/10/17 130/76  06/20/16 128/80   Wt Readings from Last 3 Encounters:  01/23/18 173 lb 8 oz  (78.7 kg)  02/10/17 157 lb 12.8 oz (71.6 kg)  06/20/16 161 lb 6.4 oz (73.2 kg)    Physical Exam  Constitutional: She appears well-developed and well-nourished.  Eyes: Conjunctivae are normal.  Neck: No thyroid mass and no thyromegaly present.  Cardiovascular: Normal rate, regular rhythm, normal heart sounds and normal pulses.  Pulmonary/Chest: Effort normal. She has wheezes in the right upper field and the left upper field. She has no rhonchi. She has no rales. Right breast exhibits no inverted nipple, no mass, no nipple discharge, no skin change and no tenderness. Left breast exhibits no inverted nipple, no mass, no nipple discharge, no skin change and no tenderness. Breasts are symmetrical.  CBE performed.  Few expiratory wheezes heard  Lymphadenopathy:       Head (right side): No submental, no submandibular, no tonsillar, no preauricular, no posterior auricular and no occipital adenopathy present.       Head (left side): No submental, no submandibular, no tonsillar, no preauricular, no posterior auricular and no occipital adenopathy present.    She has no cervical adenopathy.       Right cervical: No superficial cervical, no deep cervical and no posterior cervical adenopathy present.      Left cervical: No superficial cervical, no deep cervical and no posterior cervical adenopathy present.    She has no axillary adenopathy.  Neurological: She is alert.  Skin: Skin is warm and dry.  Approximately 4 cm well-circumscribed, soft mass appreciated left forearm.  Nontender.  Is not fluctuant.  No increased warmth, erythema, streaking.  Psychiatric: She has a normal mood and affect. Her speech is normal and behavior is normal. Thought content normal.  Vitals reviewed.      Assessment & Plan:   Problem List Items Addressed This Visit      Cardiovascular and Mediastinum   Hypertension    Well-controlled.  Continue current regimen        Respiratory   Uncomplicated asthma    History  of asthma as child.  No respiratory distress.  Few ewheezes on exam today.  Advised patient to use albuterol inhaler as needed, particularly before goes into a cold office setting.  Patient let me know if no improvement.  Discussed today if no improvement, formal lung testing, or trial of Symbicort.        Digestive   Gastroesophageal reflux disease    resolved        Other   Anxiety    Worsened of late.  Patient using Xanax as needed.  Long discussion that she likely benefit more from SSRI.  Trial of Zoloft.  Follow-up in 6 weeks.      Relevant Medications   sertraline (ZOLOFT) 50 MG tablet   ALPRAZolam (XANAX) 0.25 MG tablet   Routine  general medical examination at a health care facility - Primary    Clinical breast exam performed.  Pap smear is up-to-date;  in the absence of complaints today, pelvic exam was deferred.  Colonoscopy ordered which is due.      Relevant Orders   CBC with Differential/Platelet   Comprehensive metabolic panel   Hemoglobin A1c   Microalbumin / creatinine urine ratio   TSH   VITAMIN D 25 Hydroxy (Vit-D Deficiency, Fractures)   Lipid panel   Measles/Mumps/Rubella Immunity   MM 3D SCREEN BREAST BILATERAL   Ambulatory referral to Gastroenterology   Soft tissue mass    Pending [place] to evaluate whether lipoma or worrisome lesion. Suspect leaning on desk with forearm contributory. Will follow      Relevant Orders   [place] MiscellaneoUS Localization       I have discontinued Jaclyn Lin. Upperman's omeprazole. I am also having her start on sertraline. Additionally, I am having her maintain her fluticasone, meclizine, albuterol, triamterene-hydrochlorothiazide, and ALPRAZolam.   Meds ordered this encounter  Medications  . sertraline (ZOLOFT) 50 MG tablet    Sig: Take 1 tablet (50 mg total) by mouth at bedtime.    Dispense:  90 tablet    Refill:  3    Order Specific Question:   Supervising Provider    Answer:   Duncan Dull L [[id number]]  . ALPRAZolam  (XANAX) 0.25 MG tablet    Sig: Take 1 tablet (0.25 mg total) by mouth 2 (two) times daily as needed for sleep or anxiety.    Dispense:  60 tablet    Refill:  1    Order Specific Question:   Supervising Provider    Answer:   Sherlene Shams [[id number]]    Return precautions given.   Risks, benefits, and alternatives of the medications and treatment plan prescribed today were discussed, and patient expressed understanding.   Education regarding symptom management and diagnosis given to patient on AVS.   Continue to follow with Allegra Grana, FNP for routine health maintenance.   Jaclyn Lin and I agreed with plan.   Rennie Plowman, FNP

## 2018-01-23 NOTE — Assessment & Plan Note (Signed)
Pending US to evaluate whether lipoma or worrisome lesion. Suspect leaning on desk with forearm contributory. Will follow

## 2018-01-23 NOTE — Assessment & Plan Note (Signed)
History of asthma as child.  No respiratory distress.  Few ewheezes on exam today.  Advised patient to use albuterol inhaler as needed, particularly before goes into a cold office setting.  Patient let me know if no improvement.  Discussed today if no improvement, formal lung testing, or trial of Symbicort.

## 2018-01-23 NOTE — Assessment & Plan Note (Signed)
Well-controlled.  Continue current regimen. 

## 2018-01-27 ENCOUNTER — Telehealth: Payer: Self-pay

## 2018-01-27 NOTE — Telephone Encounter (Signed)
Copied from CRM [phone]#[id number]. Topic: General - Other >> Jan 27, 2018  2:15 PM Tamela OddiHarris, Brenda J wrote: Reason for CRM: Patient called to return a call  from someone named Roshetta.  Patient does not know what the nature of the call is about.  Please advise.  CB# [phone].

## 2018-01-28 ENCOUNTER — Other Ambulatory Visit (INDEPENDENT_AMBULATORY_CARE_PROVIDER_SITE_OTHER): Payer: Managed Care, Other (non HMO)

## 2018-01-28 DIAGNOSIS — Z0000 Encounter for general adult medical examination without abnormal findings: Secondary | ICD-10-CM | POA: Diagnosis not present

## 2018-01-29 ENCOUNTER — Other Ambulatory Visit: Payer: Self-pay

## 2018-01-29 ENCOUNTER — Telehealth: Payer: Self-pay | Admitting: Family

## 2018-01-29 DIAGNOSIS — Z1211 Encounter for screening for malignant neoplasm of colon: Secondary | ICD-10-CM

## 2018-01-29 DIAGNOSIS — M7989 Other specified soft tissue disorders: Secondary | ICD-10-CM

## 2018-01-29 LAB — CBC WITH DIFFERENTIAL/PLATELET
BASOS ABS: 0.1 10*3/uL (ref 0.0–0.2)
Basos: 1 %
EOS (ABSOLUTE): 0.2 10*3/uL (ref 0.0–0.4)
Eos: 3 %
Hematocrit: 36.6 % (ref 34.0–46.6)
Hemoglobin: 12.1 g/dL (ref 11.1–15.9)
Immature Grans (Abs): 0 10*3/uL (ref 0.0–0.1)
Immature Granulocytes: 0 %
Lymphocytes Absolute: 2.7 10*3/uL (ref 0.7–3.1)
Lymphs: 36 %
MCH: 27.2 pg (ref 26.6–33.0)
MCHC: 33.1 g/dL (ref 31.5–35.7)
MCV: 82 fL (ref 79–97)
MONOS ABS: 0.6 10*3/uL (ref 0.1–0.9)
Monocytes: 8 %
NEUTROS ABS: 3.9 10*3/uL (ref 1.4–7.0)
Neutrophils: 52 %
Platelets: 280 10*3/uL (ref 150–450)
RBC: 4.45 x10E6/uL (ref 3.77–5.28)
RDW: 14 % (ref 12.3–15.4)
WBC: 7.5 10*3/uL (ref 3.4–10.8)

## 2018-01-29 LAB — LIPID PANEL
CHOL/HDL RATIO: 3.2 ratio (ref 0.0–4.4)
Cholesterol, Total: 173 mg/dL (ref 100–199)
HDL: 54 mg/dL (ref >39)
LDL Calculated: 105 mg/dL — ABNORMAL HIGH (ref 0–99)
Triglycerides: 70 mg/dL (ref 0–149)
VLDL CHOLESTEROL CAL: 14 mg/dL (ref 5–40)

## 2018-01-29 LAB — COMPREHENSIVE METABOLIC PANEL
A/G RATIO: 1.7 (ref 1.2–2.2)
ALBUMIN: 4.6 g/dL (ref 3.5–5.5)
ALK PHOS: 55 IU/L (ref 39–117)
ALT: 10 IU/L (ref 0–32)
AST: 15 IU/L (ref 0–40)
BILIRUBIN TOTAL: 0.4 mg/dL (ref 0.0–1.2)
BUN / CREAT RATIO: 19 (ref 9–23)
BUN: 14 mg/dL (ref 6–24)
CHLORIDE: 102 mmol/L (ref 96–106)
CO2: 24 mmol/L (ref 20–29)
Calcium: 9.6 mg/dL (ref 8.7–10.2)
Creatinine, Ser: 0.75 mg/dL (ref 0.57–1.00)
GFR calc non Af Amer: 92 mL/min/{1.73_m2} (ref >59)
GFR, EST AFRICAN AMERICAN: 106 mL/min/{1.73_m2} (ref >59)
GLUCOSE: 116 mg/dL — AB (ref 65–99)
Globulin, Total: 2.7 g/dL (ref 1.5–4.5)
POTASSIUM: 3.8 mmol/L (ref 3.5–5.2)
Sodium: 142 mmol/L (ref 134–144)
TOTAL PROTEIN: 7.3 g/dL (ref 6.0–8.5)

## 2018-01-29 LAB — MICROALBUMIN / CREATININE URINE RATIO
CREATININE, UR: 113 mg/dL
Microalb/Creat Ratio: 9 mg/g creat (ref 0.0–30.0)
Microalbumin, Urine: 10.2 ug/mL

## 2018-01-29 LAB — MEASLES/MUMPS/RUBELLA IMMUNITY
MUMPS ABS, IGG: 139 [AU]/ml (ref >10.9)
RUBEOLA AB, IGG: 101 [AU]/ml (ref >29.9)
Rubella Antibodies, IGG: 6.57 index (ref >0.99)

## 2018-01-29 LAB — VITAMIN D 25 HYDROXY (VIT D DEFICIENCY, FRACTURES): VIT D 25 HYDROXY: 19.3 ng/mL — AB (ref 30.0–100.0)

## 2018-01-29 LAB — TSH: TSH: 1.21 u[IU]/mL (ref 0.450–4.500)

## 2018-01-29 LAB — HEMOGLOBIN A1C
ESTIMATED AVERAGE GLUCOSE: 123 mg/dL
Hgb A1c MFr Bld: 5.9 % — ABNORMAL HIGH (ref 4.8–5.6)

## 2018-01-29 NOTE — Telephone Encounter (Signed)
US misc dated on 01/23/2018 [facility] can schedule. Please and Thank you!

## 2018-01-30 ENCOUNTER — Telehealth: Payer: Self-pay | Admitting: Family

## 2018-01-30 DIAGNOSIS — J45909 Unspecified asthma, uncomplicated: Secondary | ICD-10-CM

## 2018-01-30 MED ORDER — PREDNISONE 10 MG PO TABS: ORAL_TABLET | ORAL | 0 refills | Status: DC

## 2018-01-30 NOTE — Telephone Encounter (Signed)
rx request 

## 2018-01-30 NOTE — Telephone Encounter (Signed)
Copied from CRM [id number]. Topic: Inquiry >> Jan 30, 2018  9:09 AM Tamela OddiMartin, Don'Quashia, NT wrote: Reason for CRM: patient called and states that she seen Claris CheMargaret on 01/23/18 and she states if that patient wheezing was not getting any better , that she would call her in something.  Patient states her wheezing is not getting any better. She is wondering if Claris CheMargaret can give her something other that those 10 day steroids , she states it drives her crazy. Please advise. Thank you  [id number] [place]- [place], [place] - [address] [facility] [address] [phone] (Phone) [phone] (Fax)

## 2018-01-30 NOTE — Telephone Encounter (Signed)
Left voice mail for patient to call back ok for PEC to speak to patient , regarding below message, please also see lab results

## 2018-01-30 NOTE — Telephone Encounter (Signed)
Corrected  Please let me know if any more probs

## 2018-01-30 NOTE — Telephone Encounter (Signed)
Call pt I will send in short course prednisone Please also see result note from labs- you can go over as well  Please monitor BP on steriod and ensure she gets doses in by 2pm each day as will effect sleep  Does she think allergies contributory? How often does she use albuterol inhaler? Is she nighttime awakenings from cough?  Impt to manage triggers. Is she using flonase? Does that help?  Please also advise , she may trial antihistamine, such as claritin.   Please make 2 month f/u , sooner if prednisone doesn't help  We may should consider pulmicort which is a low dose steriod for maintenance therapy

## 2018-02-02 ENCOUNTER — Telehealth: Payer: Self-pay

## 2018-02-02 NOTE — Telephone Encounter (Signed)
Error

## 2018-02-05 ENCOUNTER — Encounter: Payer: Self-pay | Admitting: *Deleted

## 2018-02-05 ENCOUNTER — Telehealth: Payer: Self-pay

## 2018-02-05 NOTE — Telephone Encounter (Signed)
Contacted patient to request rescheduling of her colonoscopy due to scope schedule change (Dr. Allegra LaiVanga will be off).  Patient has agreed to reschedule and she will call back to reschedule.  Thanks [employer]le

## 2018-02-06 NOTE — Telephone Encounter (Signed)
Spoke with patient she states she has finished Prednisone.  Still wheezing some didn't want to schedule appointment at this time. She thinks it maybe allergy related will try claritin and flonase.  Will try to use albuterol as needed .  She did get my message, however she thought it was in regards to MRI being set up.   She will call back to schedule appointment if symptoms don't improve.

## 2018-02-08 NOTE — Telephone Encounter (Signed)
Noted  

## 2018-02-10 ENCOUNTER — Ambulatory Visit
Admission: RE | Admit: 2018-02-10 | Discharge: 2018-02-10 | Disposition: A | Discharge status: Home / Self Care | Payer: Managed Care, Other (non HMO) | Source: Ambulatory Visit | Attending: Family | Admitting: Family

## 2018-02-10 DIAGNOSIS — M7989 Other specified soft tissue disorders: Secondary | ICD-10-CM

## 2018-02-10 DIAGNOSIS — M799 Soft tissue disorder, unspecified: Secondary | ICD-10-CM | POA: Diagnosis present

## 2018-02-11 ENCOUNTER — Encounter: Payer: 59 | Admitting: Family

## 2018-02-12 ENCOUNTER — Encounter: Admission: RE | Payer: Self-pay | Source: Ambulatory Visit

## 2018-02-12 ENCOUNTER — Encounter: Payer: 59 | Admitting: Family

## 2018-02-12 ENCOUNTER — Ambulatory Visit
Admission: RE | Admit: 2018-02-12 | Payer: Managed Care, Other (non HMO) | Source: Ambulatory Visit | Admitting: Gastroenterology

## 2018-02-12 SURGERY — COLONOSCOPY WITH PROPOFOL
Anesthesia: General

## 2018-02-13 ENCOUNTER — Encounter: Payer: 59 | Admitting: Family

## 2018-02-25 ENCOUNTER — Ambulatory Visit: Payer: Managed Care, Other (non HMO) | Admitting: Family

## 2018-02-25 DIAGNOSIS — Z0289 Encounter for other administrative examinations: Secondary | ICD-10-CM

## 2018-03-09 ENCOUNTER — Ambulatory Visit
Admission: RE | Admit: 2018-03-09 | Discharge: 2018-03-09 | Disposition: A | Discharge status: Home / Self Care | Payer: Managed Care, Other (non HMO) | Source: Ambulatory Visit | Attending: Family | Admitting: Family

## 2018-03-09 ENCOUNTER — Encounter: Payer: Self-pay | Admitting: Family

## 2018-03-09 ENCOUNTER — Ambulatory Visit (INDEPENDENT_AMBULATORY_CARE_PROVIDER_SITE_OTHER): Payer: Managed Care, Other (non HMO) | Admitting: Family

## 2018-03-09 ENCOUNTER — Ambulatory Visit (INDEPENDENT_AMBULATORY_CARE_PROVIDER_SITE_OTHER): Payer: Managed Care, Other (non HMO)

## 2018-03-09 VITALS — BP 130/80 | HR 102 | Temp 98.7°F | Resp 18 | Ht 66.0 in | Wt 174.2 lb

## 2018-03-09 DIAGNOSIS — J45909 Unspecified asthma, uncomplicated: Secondary | ICD-10-CM

## 2018-03-09 DIAGNOSIS — F419 Anxiety disorder, unspecified: Secondary | ICD-10-CM

## 2018-03-09 DIAGNOSIS — R0602 Shortness of breath: Secondary | ICD-10-CM | POA: Insufficient documentation

## 2018-03-09 MED ORDER — BUDESONIDE-FORMOTEROL FUMARATE 80-4.5 MCG/ACT IN AERO: 2.0000 | INHALATION_SPRAY | Freq: Two times a day (BID) | RESPIRATORY_TRACT | 2 refills | Status: DC

## 2018-03-09 MED ORDER — IOHEXOL 350 MG/ML SOLN
75.0000 mL | Freq: Once | INTRAVENOUS | Status: AC | PRN
  Administered 2018-03-09: 75 mL via INTRAVENOUS

## 2018-03-09 NOTE — Patient Instructions (Signed)
Start zoloft again. Start at 50mg  daily for 10 days and then start taking two tablets ( for a total of 100mg ) at bedtime.   Continue to use xanax sparingly and please try to refrain from drinking so much beer  Start symbicort which is a MAINTENANCE inhaler in which you use EVERYDAY to keep symptoms controlled.   Continue to use your albuterol as needed.   Stop smoking as suspect this is a trigger for your coughing   Asthma Attack Prevention, Adult Although you may not be able to control the fact that you have asthma, you can take actions to prevent episodes of asthma (asthma attacks). These actions include:  Creating a written plan for managing and treating your asthma attacks (asthma action plan).  Monitoring your asthma.  Avoiding things that can irritate your airways or make your asthma symptoms worse (asthma triggers).  Taking your medicines as directed.  Acting quickly if you have signs or symptoms of an asthma attack.  What are some ways to prevent an asthma attack? Create a plan Work with your health care provider to create an asthma action plan. This plan should include:  A list of your asthma triggers and how to avoid them.  A list of symptoms that you experience during an asthma attack.  Information about when to take medicine and how much medicine to take.  Information to help you understand your peak flow measurements.  Contact information for your health care providers.  Daily actions that you can take to control asthma.  Monitor your asthma  To monitor your asthma:  Use your peak flow meter every morning and every evening for 2-3 weeks. Record the results in a journal. A drop in your peak flow numbers on one or more days may mean that you are starting to have an asthma attack, even if you are not having symptoms.  When you have asthma symptoms, write them down in a journal.  Avoid asthma triggers  Work with your health care provider to find out what  your asthma triggers are. This can be done by:  Being tested for allergies.  Keeping a journal that notes when asthma attacks occur and what may have contributed to them.  Asking your health care provider whether other medical conditions make your asthma worse.  Common asthma triggers include:  Dust.  Smoke. This includes campfire smoke and secondhand smoke from tobacco products.  Pet dander.  Trees, grasses or pollens.  Very cold, dry, or humid air.  Mold.  Foods that contain high amounts of sulfites.  Strong smells.  Engine exhaust and air pollution.  Aerosol sprays and fumes from household cleaners.  Household pests and their droppings, including dust mites and cockroaches.  Certain medicines, including NSAIDs.  Once you have determined your asthma triggers, take steps to avoid them. Depending on your triggers, you may be able to reduce the chance of an asthma attack by:  Keeping your home clean. Have someone dust and vacuum your home for you 1 or 2 times a week. If possible, have them use a high-efficiency particulate arrestance (HEPA) vacuum.  Washing your sheets weekly in hot water.  Using allergy-proof mattress covers and casings on your bed.  Keeping pets out of your home.  Taking care of mold and water problems in your home.  Avoiding areas where people smoke.  Avoiding using strong perfumes or odor sprays.  Avoid spending a lot of time outdoors when pollen counts are high and on very windy days.  Talking with your health care provider before stopping or starting any new medicines.  Medicines Take over-the-counter and prescription medicines only as told by your health care provider. Many asthma attacks can be prevented by carefully following your medicine schedule. Taking your medicines correctly is especially important when you cannot avoid certain asthma triggers. Even if you are doing well, do not stop taking your medicine and do not take less  medicine. Act quickly If an asthma attack happens, acting quickly can decrease how severe it is and how long it lasts. Take these actions:  Pay attention to your symptoms. If you are coughing, wheezing, or having difficulty breathing, do not wait to see if your symptoms go away on their own. Follow your asthma action plan.  If you have followed your asthma action plan and your symptoms are not improving, call your health care provider or seek immediate medical care at the nearest hospital.  It is important to write down how often you need to use your fast-acting rescue inhaler. You can track how often you use an inhaler in your journal. If you are using your rescue inhaler more often, it may mean that your asthma is not under control. Adjusting your asthma treatment plan may help you to prevent future asthma attacks and help you to gain better control of your condition. How can I prevent an asthma attack when I exercise?  Exercise is a common asthma trigger. To prevent asthma attacks during exercise:  Follow advice from your health care provider about whether you should use your fast-acting inhaler before exercising. Many people with asthma experience exercise-induced bronchoconstriction (EIB). This condition often worsens during vigorous exercise in cold, humid, or dry environments. Usually, people with EIB can stay very active by using a fast-acting inhaler before exercising.  Avoid exercising outdoors in very cold or humid weather.  Avoid exercising outdoors when pollen counts are high.  Warm up and cool down when exercising.  Stop exercising right away if asthma symptoms start.  Consider taking part in exercises that are less likely to cause asthma symptoms such as:  Indoor swimming.  Biking.  Walking.  Hiking.  Playing football.  This information is not intended to replace advice given to you by your health care provider. Make sure you discuss any questions you have with your  health care provider. Document Released: 05/29/2009 Document Revised: 02/09/2016 Document Reviewed: 11/25/2015 Elsevier Interactive Patient Education  [employer].

## 2018-03-09 NOTE — Progress Notes (Signed)
Subjective:    Patient ID: Jaclyn Lin, female    DOB: 12/12/1965, 52 y.o.   MRN: [id number]  CC: Jaclyn Lin is a 52 y.o. female who presents today for an acute visit.    HPI: Friend/cousin with her today.   GAD- worsened. Very worried about son.  Took zoloft for 3 weeks, then stopped as wasn't sure if helpful. uses xanax occasionally for panic attacks. Trouble sleeping. No si/hi. Drinking one 32oz beer most nights.   Asthma-reports using her albuterol inhaler almost a 3 times a day.  She still smokes although less often now.  Productive cough, occasional wheeze. She is currently on a course of amoxicillin from urgent care visit , little improvement. Received steroid IM one week ago.  Given prednisone last month from me, reports no real improvement.  Shortness of breath occasionally with crying or 'upset'. Not hasnt worsned. Also triggered when coming in and out freezer. Improves with use of inhaler. Feels better when blows air out of her month. 'doesn't really notice it.' This is not changed.  No chest pain, palpitations, fever, leg swelling. No recent long travel, immobilization, h/o cancer, hemoptysis.      HISTORY:  Past Medical History:  Diagnosis Date  . Hypertension   . UTI (lower urinary tract infection)    Past Surgical History:  Procedure Laterality Date  . ABDOMINAL HYSTERECTOMY     partial, Dr. Haskel KhanVanDalen  . VAGINAL DELIVERY     2   Family History  Problem Relation Age of Onset  . Hypertension Mother   . Cancer Maternal Grandfather        bone  . Asthma Maternal Grandmother   . Brain cancer Maternal Grandmother   . Asthma Son     Allergies: Codeine Current Outpatient Medications on File Prior to Visit  Medication Sig Dispense Refill  . albuterol (PROVENTIL HFA;VENTOLIN HFA) 108 (90 Base) MCG/ACT inhaler Inhale 1-2 puffs into the lungs every 6 (six) hours as needed for wheezing or shortness of breath. 1 Inhaler 1  . ALPRAZolam (XANAX) 0.25 MG  tablet Take 1 tablet (0.25 mg total) by mouth 2 (two) times daily as needed for sleep or anxiety. 60 tablet 1  . fluticasone (FLONASE) 50 MCG/ACT nasal spray Place 2 sprays into both nostrils daily. 16 g 6  . meclizine (ANTIVERT) 25 MG tablet Take 1 tablet (25 mg total) by mouth 3 (three) times daily as needed for dizziness. 30 tablet 0  . triamterene-hydrochlorothiazide (DYAZIDE) 37.5-25 MG capsule take 1 capsule by mouth once daily 90 capsule 1  . sertraline (ZOLOFT) 50 MG tablet Take 1 tablet (50 mg total) by mouth at bedtime. (Patient not taking: Reported on 03/09/2018) 90 tablet 3   No current facility-administered medications on file prior to visit.     Social History   Tobacco Use  . Smoking status: Former Smoker    Packs/day: 0.50    Years: 20.00    Pack years: 10.00    Types: Cigarettes    Start date: 02/11/2016  . Smokeless tobacco: Never Used  Substance Use Topics  . Alcohol use: Yes    Comment: Beer on weekends; 6 pack  . Drug use: No    Review of Systems  Constitutional: Negative for chills and fever.  HENT: Positive for congestion. Negative for ear pain, sore throat and trouble swallowing.   Respiratory: Positive for cough, shortness of breath and wheezing.   Cardiovascular: Negative for chest pain, palpitations and leg swelling.  Gastrointestinal:  Negative for nausea and vomiting.  Psychiatric/Behavioral: Positive for sleep disturbance. Negative for suicidal ideas. The patient is nervous/anxious.       Objective:    BP 130/80 (BP Location: Left Arm, Patient Position: Sitting, Cuff Size: Normal)   Pulse (!) 102   Temp 98.7 F (37.1 C) (Oral)   Resp 18   Ht 5\' 6"  (1.676 m)   Wt 174 lb 4 oz (79 kg)   SpO2 98%   BMI 28.12 kg/m    Physical Exam  Constitutional: She appears well-developed and well-nourished.  Eyes: Conjunctivae are normal.  Cardiovascular: Normal rate, regular rhythm, normal heart sounds and normal pulses.  Pulmonary/Chest: Effort normal and  breath sounds normal. She has no wheezes. She has no rhonchi. She has no rales.  One expiratory wheeze.   Neurological: She is alert.  Skin: Skin is warm and dry.  Psychiatric: She has a normal mood and affect. Her speech is normal and behavior is normal. Thought content normal.  Vitals reviewed.      Assessment & Plan:    Problem List Items Addressed This Visit      Respiratory   Uncomplicated asthma    Poor control. Walking sas02 > 96%. Pending cxr. No real improvement from prednisone, current antibiotics.Trial of symbicort daily due to concern for COPD and ashtam. Referral to pulmonology for formal testing, evaluation. Declines nebulizer in office. One month f/u      Relevant Medications   budesonide-formoterol (SYMBICORT) 80-4.5 MCG/ACT inhaler   Other Relevant Orders   DG Chest 2 View   Ambulatory referral to Pulmonology     Other   Anxiety - Primary    Worsened. Advised to restart zoloft and then titrate to 100mg . Uses xanax sparingly and I advised this. Discussed BZDs risks including addiction. Overdose, and cognitive decline. Close follow up. May consider trazodone at follow up if sleep not improved.       Relevant Orders   Ambulatory referral to Psychology   Shortness of breath    Slightly tachycardic. Wells criteria low. However based on SOB, smoking status, failure to respond to prednisone. We agreed to pursue stat CT angio. Ordered.       Relevant Orders   CT Angio Chest W/Cm &/Or Wo Cm       I have discontinued Kathrin Penner. Jaclyn Lin's predniSONE. I am also having her start on budesonide-formoterol. Additionally, I am having her maintain her fluticasone, meclizine, albuterol, triamterene-hydrochlorothiazide, sertraline, and ALPRAZolam.   Meds ordered this encounter  Medications  . budesonide-formoterol (SYMBICORT) 80-4.5 MCG/ACT inhaler    Sig: Inhale 2 puffs into the lungs 2 (two) times daily.    Dispense:  1 Inhaler    Refill:  2    Order Specific  Question:   Supervising Provider    Answer:   Sherlene Shams [[id number]]    Return precautions given.   Risks, benefits, and alternatives of the medications and treatment plan prescribed today were discussed, and patient expressed understanding.   Education regarding symptom management and diagnosis given to patient on AVS.  Continue to follow with Allegra Grana, FNP for routine health maintenance.   Jaclyn Rainier and I agreed with plan.   Rennie Plowman, FNP

## 2018-03-09 NOTE — Assessment & Plan Note (Signed)
Slightly tachycardic. Wells criteria low. However based on SOB, smoking status, failure to respond to prednisone. We agreed to pursue stat CT angio. Ordered.

## 2018-03-09 NOTE — Assessment & Plan Note (Addendum)
Poor control. Walking sas02 > 96%. Pending cxr. No real improvement from prednisone, current antibiotics.Trial of symbicort daily due to concern for COPD and ashtam. Referral to pulmonology for formal testing, evaluation. Declines nebulizer in office. One month f/u

## 2018-03-09 NOTE — Assessment & Plan Note (Signed)
Worsened. Advised to restart zoloft and then titrate to 100mg . Uses xanax sparingly and I advised this. Discussed BZDs risks including addiction. Overdose, and cognitive decline. Close follow up. May consider trazodone at follow up if sleep not improved.

## 2018-03-11 ENCOUNTER — Telehealth: Payer: Self-pay | Admitting: Family

## 2018-03-11 NOTE — Telephone Encounter (Signed)
Called and discussed CT chest, CXR.  No acute findings No pe, emphysema seen.  Suspect mild tachycardia related to anxiety today as patient is a lot going on.  Patient agrees with this. We agreed to discuss smoking cessation at follow up visits; she politely declines any medication therapy at this time  Advised to let me know if cough, shortness of breath, wheezing does not improve with maintenance therapy for asthma.    Follow-up in 1 month

## 2018-03-20 ENCOUNTER — Encounter: Payer: Self-pay | Admitting: Family

## 2018-04-20 ENCOUNTER — Telehealth: Payer: Self-pay | Admitting: Family

## 2018-04-20 NOTE — Telephone Encounter (Signed)
Mail letter  Please mail letter-  Ms Jaclyn Lin, Jaclyn Lin you are well.   In reviewing your chart, it appears your are due for annual mammogram.You may already have scheduled- if so, you may disregard this letter.   Please let us know if can be of assistance.   My best,   Rennie Plowman, NP

## 2018-04-21 ENCOUNTER — Ambulatory Visit (INDEPENDENT_AMBULATORY_CARE_PROVIDER_SITE_OTHER): Payer: 59 | Admitting: Psychology

## 2018-04-21 DIAGNOSIS — F329 Major depressive disorder, single episode, unspecified: Secondary | ICD-10-CM

## 2018-04-23 NOTE — Telephone Encounter (Signed)
Mailed letter °

## 2018-04-30 ENCOUNTER — Ambulatory Visit (INDEPENDENT_AMBULATORY_CARE_PROVIDER_SITE_OTHER): Payer: 59 | Admitting: Psychology

## 2018-04-30 DIAGNOSIS — F3289 Other specified depressive episodes: Secondary | ICD-10-CM | POA: Diagnosis not present

## 2018-05-12 ENCOUNTER — Ambulatory Visit: Payer: 59 | Admitting: Psychology

## 2018-06-24 ENCOUNTER — Other Ambulatory Visit: Payer: Self-pay | Admitting: Family

## 2018-06-24 DIAGNOSIS — I10 Essential (primary) hypertension: Secondary | ICD-10-CM

## 2018-06-27 ENCOUNTER — Other Ambulatory Visit: Payer: Self-pay

## 2018-06-27 ENCOUNTER — Emergency Department: Payer: Managed Care, Other (non HMO)

## 2018-06-27 DIAGNOSIS — R062 Wheezing: Secondary | ICD-10-CM | POA: Insufficient documentation

## 2018-06-27 DIAGNOSIS — R05 Cough: Secondary | ICD-10-CM | POA: Diagnosis not present

## 2018-06-27 DIAGNOSIS — Z5321 Procedure and treatment not carried out due to patient leaving prior to being seen by health care provider: Secondary | ICD-10-CM | POA: Diagnosis not present

## 2018-06-27 DIAGNOSIS — R0602 Shortness of breath: Secondary | ICD-10-CM | POA: Insufficient documentation

## 2018-06-27 MED ORDER — ALBUTEROL SULFATE (2.5 MG/3ML) 0.083% IN NEBU
INHALATION_SOLUTION | RESPIRATORY_TRACT | Status: AC
  Filled 2018-06-27: qty 6

## 2018-06-27 MED ORDER — ALBUTEROL SULFATE (2.5 MG/3ML) 0.083% IN NEBU
5.0000 mg | INHALATION_SOLUTION | Freq: Once | RESPIRATORY_TRACT | Status: AC
  Administered 2018-06-27: 5 mg via RESPIRATORY_TRACT

## 2018-06-27 NOTE — ED Triage Notes (Signed)
Patient reports becoming short of breath today and reports cough and wheezing.

## 2018-06-27 NOTE — ED Notes (Signed)
Inspiratory wheezing noted on left especially left upper lobe.

## 2018-06-28 ENCOUNTER — Other Ambulatory Visit: Payer: Self-pay

## 2018-06-28 ENCOUNTER — Emergency Department
Admission: EM | Admit: 2018-06-28 | Discharge: 2018-06-28 | Disposition: A | Discharge status: Home / Self Care | Payer: Managed Care, Other (non HMO) | Attending: Emergency Medicine | Admitting: Emergency Medicine

## 2018-06-28 ENCOUNTER — Encounter: Payer: Self-pay | Admitting: Emergency Medicine

## 2018-06-28 ENCOUNTER — Ambulatory Visit (INDEPENDENT_AMBULATORY_CARE_PROVIDER_SITE_OTHER)
Admission: EM | Admit: 2018-06-28 | Discharge: 2018-06-28 | Disposition: A | Discharge status: Home / Self Care | Payer: Managed Care, Other (non HMO) | Source: Home / Self Care | Attending: Family Medicine | Admitting: Family Medicine

## 2018-06-28 DIAGNOSIS — R062 Wheezing: Secondary | ICD-10-CM

## 2018-06-28 DIAGNOSIS — R05 Cough: Secondary | ICD-10-CM

## 2018-06-28 DIAGNOSIS — R059 Cough, unspecified: Secondary | ICD-10-CM

## 2018-06-28 DIAGNOSIS — F17210 Nicotine dependence, cigarettes, uncomplicated: Secondary | ICD-10-CM

## 2018-06-28 DIAGNOSIS — R0602 Shortness of breath: Secondary | ICD-10-CM

## 2018-06-28 DIAGNOSIS — Z720 Tobacco use: Secondary | ICD-10-CM

## 2018-06-28 HISTORY — DX: Unspecified asthma, uncomplicated: J45.909

## 2018-06-28 MED ORDER — METHYLPREDNISOLONE SODIUM SUCC 40 MG IJ SOLR
80.0000 mg | Freq: Once | INTRAMUSCULAR | Status: AC
  Administered 2018-06-28: 80 mg via INTRAMUSCULAR

## 2018-06-28 MED ORDER — IPRATROPIUM-ALBUTEROL 0.5-2.5 (3) MG/3ML IN SOLN
3.0000 mL | Freq: Once | RESPIRATORY_TRACT | Status: AC
  Administered 2018-06-28: 3 mL via RESPIRATORY_TRACT

## 2018-06-28 MED ORDER — PREDNISONE 10 MG (21) PO TBPK: ORAL_TABLET | ORAL | 0 refills | Status: DC

## 2018-06-28 MED ORDER — ALBUTEROL SULFATE HFA 108 (90 BASE) MCG/ACT IN AERS: 2.0000 | INHALATION_SPRAY | Freq: Four times a day (QID) | RESPIRATORY_TRACT | 1 refills | Status: DC | PRN

## 2018-06-28 MED ORDER — AZITHROMYCIN 250 MG PO TABS: 250.0000 mg | ORAL_TABLET | Freq: Every day | ORAL | 0 refills | Status: DC

## 2018-06-28 NOTE — ED Triage Notes (Signed)
Patient in today c/o cough x yesterday. Patient states she was having sob and went to [facility] ED and was given a breathing treatment and CXR. Patient left before getting results or treatment. Patient denies fever. Patient has not tried any OTC medications. She states that she thinks she needs steroids and an antibiotic.

## 2018-06-28 NOTE — Discharge Instructions (Addendum)
You were given a steroid shot (SoluMedrol 80mg ) today and a breathing tx (DuoNeb). Recommend start Prednisone 10mg  - take 6 tablets today then decrease by 1 tablet each day until finished. Increase fluid intake to help loosen up mucus. Encouraged to d/c smoking. Continue Albuterol inhaler 2 puffs every 6 hours as needed and take Symbicort inhaler daily as directed. May also use OTC Delsym cough syrup or similar medication as needed. If cough and shortness of breath do not improve within 2 to 3 days, may start Z-pak as directed (Rx written). Follow-up with your PCP in 3 to 4 days if minimal improvement.

## 2018-06-28 NOTE — ED Provider Notes (Signed)
[facility]    CSN: [id number] Arrival date & time: 06/28/18  1128     History   Chief Complaint Chief Complaint  Patient presents with  . Cough    HPI Jaclyn Lin is a 53 y.o. female.   53 year old female presents with cough and shortness of breath that started yesterday. Also having slight chest tightness and tenderness especially around mid-back. Denies any fever. Vomited yesterday due to mucus. Minimal nasal congestion and irritated throat. Denies any diarrhea. Went to [facility] last night due to symptoms and had a chest x-ray and Albuterol breathing tx but left before being further evaluated or any other additional treatment provided. Other chronic health issues include asthma, HTN and anxiety and currently on Symbicort, Dyazide and Xanax daily and Albuterol prn. Quit smoking in January 2019 but has resumed.   The history is provided by the patient.    Past Medical History:  Diagnosis Date  . Asthma    childhood  . Hypertension   . UTI (lower urinary tract infection)     Patient Active Problem List   Diagnosis Date Noted  . Shortness of breath 03/09/2018  . Uncomplicated asthma 01/23/2018  . Soft tissue mass 01/23/2018  . Gastroesophageal reflux disease 02/10/2017  . BPPV (benign paroxysmal positional vertigo) 09/11/2015  . Chronic fatigue 01/27/2015  . Routine general medical examination at a health care facility 07/21/2014  . Prediabetes 12/17/2013  . Anxiety 04/29/2012  . Hypertension 05/24/2011    Past Surgical History:  Procedure Laterality Date  . ABDOMINAL HYSTERECTOMY     partial, Dr. Haskel Khan  . VAGINAL DELIVERY     2    OB History   No obstetric history on file.      Home Medications    Prior to Admission medications   Medication Sig Start Date End Date Taking? Authorizing Provider  ALPRAZolam (XANAX) 0.25 MG tablet Take 1 tablet (0.25 mg total) by mouth 2 (two) times daily as needed for sleep or anxiety. 01/23/18  Yes  Allegra Grana, FNP  budesonide-formoterol (SYMBICORT) 80-4.5 MCG/ACT inhaler Inhale 2 puffs into the lungs 2 (two) times daily. 03/09/18  Yes Allegra Grana, FNP  triamterene-hydrochlorothiazide (DYAZIDE) 37.5-25 MG capsule TAKE 1 CAPSULE BY MOUTH ONCE DAILY 06/25/18  Yes Arnett, Lyn Records, FNP  albuterol (PROVENTIL HFA;VENTOLIN HFA) 108 (90 Base) MCG/ACT inhaler Inhale 2 puffs into the lungs every 6 (six) hours as needed for wheezing or shortness of breath. 06/28/18   Sudie Grumbling, NP  azithromycin (ZITHROMAX) 250 MG tablet Take 1 tablet (250 mg total) by mouth daily. Take first 2 tablets together, then 1 every day until finished. 06/28/18   Sudie Grumbling, NP  predniSONE (STERAPRED UNI-PAK 21 TAB) 10 MG (21) TBPK tablet Take 6 tabs by mouth daily today then decrease by 1 tablet each day until finished on day 6. 06/28/18   Kaeden Mester, Ali Lowe, NP    Family History Family History  Problem Relation Age of Onset  . Hypertension Mother   . Cancer Maternal Grandfather        bone  . Asthma Maternal Grandmother   . Brain cancer Maternal Grandmother   . Asthma Son     Social History Social History   Tobacco Use  . Smoking status: Current Every Day Smoker    Packs/day: 0.50    Years: 20.00    Pack years: 10.00    Types: Cigarettes    Start date: 02/11/2016  . Smokeless tobacco:  Never Used  . Tobacco comment: quit in Jan 2019 but has resumed   Substance Use Topics  . Alcohol use: Yes    Comment: Beer on weekends; 6 pack  . Drug use: No     Allergies   Codeine   Review of Systems Review of Systems  Constitutional: Positive for fatigue. Negative for activity change, appetite change, chills and fever.  HENT: Positive for congestion, postnasal drip and sore throat. Negative for ear discharge, ear pain, facial swelling, mouth sores, nosebleeds, rhinorrhea, sinus pressure, sinus pain, sneezing and trouble swallowing.   Eyes: Negative for pain, discharge, redness and itching.    Respiratory: Positive for cough, chest tightness, shortness of breath and wheezing. Negative for stridor.   Cardiovascular: Negative for chest pain.  Gastrointestinal: Positive for vomiting (due to mucus). Negative for abdominal pain, diarrhea and nausea.  Musculoskeletal: Negative for arthralgias, myalgias, neck pain and neck stiffness.  Skin: Negative for color change, rash and wound.  Neurological: Negative for dizziness, tremors, seizures, syncope, weakness, light-headedness, numbness and headaches.  Hematological: Negative for adenopathy. Does not bruise/bleed easily.     Physical Exam Triage Vital Signs ED Triage Vitals [06/28/18 1142]  Enc Vitals Group     BP 135/87     Pulse Rate 84     Resp 16     Temp 98 F (36.7 C)     Temp Source Oral     SpO2 97 %     Weight 180 lb (81.6 kg)     Height 5\' 6"  (1.676 m)     Head Circumference      Peak Flow      Pain Score 0     Pain Loc      Pain Edu?      Excl. in GC?    No data found.  Updated Vital Signs BP 135/87 (BP Location: Left Arm)   Pulse 84   Temp 98 F (36.7 C) (Oral)   Resp 16   Ht 5\' 6"  (1.676 m)   Wt 180 lb (81.6 kg)   SpO2 97%   BMI 29.05 kg/m   Visual Acuity Right Eye Distance:   Left Eye Distance:   Bilateral Distance:    Right Eye Near:   Left Eye Near:    Bilateral Near:     Physical Exam Vitals signs and nursing note reviewed.  Constitutional:      General: She is awake. She is not in acute distress.    Appearance: Normal appearance. She is well-developed and well-groomed. She is not ill-appearing.     Comments: Patient sitting comfortably on exam table in no acute distress.   HENT:     Head: Normocephalic and atraumatic.     Right Ear: Hearing, tympanic membrane, ear canal and external ear normal.     Left Ear: Hearing, tympanic membrane, ear canal and external ear normal.     Nose: Rhinorrhea present.     Right Turbinates: Not enlarged or swollen.     Left Turbinates: Not enlarged  or swollen.     Right Sinus: No maxillary sinus tenderness or frontal sinus tenderness.     Left Sinus: No maxillary sinus tenderness or frontal sinus tenderness.     Mouth/Throat:     Lips: Pink.     Mouth: Mucous membranes are moist.     Pharynx: Oropharynx is clear. Uvula midline. No pharyngeal swelling, oropharyngeal exudate or posterior oropharyngeal erythema.  Eyes:     Extraocular Movements: Extraocular movements  intact.     Conjunctiva/sclera: Conjunctivae normal.  Neck:     Musculoskeletal: Normal range of motion and neck supple. No muscular tenderness.  Cardiovascular:     Rate and Rhythm: Normal rate and regular rhythm.     Heart sounds: Normal heart sounds. No murmur.  Pulmonary:     Effort: Pulmonary effort is normal. No tachypnea or respiratory distress.     Breath sounds: Decreased air movement present. Examination of the right-upper field reveals decreased breath sounds and wheezing. Examination of the left-upper field reveals decreased breath sounds and wheezing. Examination of the right-lower field reveals decreased breath sounds and wheezing. Examination of the left-lower field reveals decreased breath sounds and wheezing. Decreased breath sounds and wheezing present. No rhonchi or rales.  Musculoskeletal: Normal range of motion.  Lymphadenopathy:     Cervical: No cervical adenopathy.  Skin:    General: Skin is warm and dry.  Neurological:     General: No focal deficit present.     Mental Status: She is alert and oriented to person, place, and time.  Psychiatric:        Mood and Affect: Mood normal.        Behavior: Behavior normal. Behavior is cooperative.      UC Treatments / Results  Labs (all labs ordered are listed, but only abnormal results are displayed) Labs Reviewed - No data to display  EKG None  Radiology Dg Chest 2 View  Result Date: 06/27/2018 CLINICAL DATA:  Initial evaluation for acute shortness of breath. EXAM: CHEST - 2 VIEW COMPARISON:   Prior radiograph from 03/09/2018. FINDINGS: Cardiac and mediastinal silhouettes are stable in size and contour, and remain within normal limits. Lungs mildly hypoinflated. Mild scattered bibasilar subsegmental atelectasis. No consolidative opacity. No pulmonary edema or pleural effusion. No pneumothorax. No acute osseous abnormality. IMPRESSION: 1. Shallow lung inflation with mild bibasilar subsegmental atelectasis. 2. No other active cardiopulmonary disease. Electronically Signed   By: Rise Mu M.D.   On: 06/27/2018 22:36    Procedures Procedures (including critical care time)  Medications Ordered in UC Medications  ipratropium-albuterol (DUONEB) 0.5-2.5 (3) MG/3ML nebulizer solution 3 mL (3 mLs Nebulization Given 06/28/18 1215)  methylPREDNISolone sodium succinate (SOLU-MEDROL) 40 mg/mL injection 80 mg (80 mg Intramuscular Given 06/28/18 1213)    Initial Impression / Assessment and Plan / UC Course  I have reviewed the triage vital signs and the nursing notes.  Pertinent labs & imaging results that were available during my care of the patient were reviewed by me and considered in my medical decision making (see chart for details).    Reviewed chest x-ray results from [facility] last evening- no distinct pneumonia but decreased air flow. Gave Solu-Medrol 80mg  IM now and DuoNeb tx now to help with wheezing and chest tightness. Patient indicated that she could breathe easier after treatment and less wheezes heard. Discussed that she probably has a viral illness. May start Prednisone 10mg  6 day dose pack as directed. Continue Albuterol 2 puffs every 6 hours as needed. Strongly encouraged to use Symbicort daily. Strongly encouraged to stop smoking. Increase fluid intake to help loosen up mucus. May take OTC Delsym every 12 hours as needed for cough.  If cough and congestion do not improve in 3 to 4 days, may start Z-pak as directed- Rx written. Follow-up with your PCP in 3 to 4 days if minimal  improvement.  Final Clinical Impressions(s) / UC Diagnoses   Final diagnoses:  Cough  Shortness of breath  Wheezes     Discharge Instructions     You were given a steroid shot (SoluMedrol 80mg ) today and a breathing tx (DuoNeb). Recommend start Prednisone 10mg  - take 6 tablets today then decrease by 1 tablet each day until finished. Increase fluid intake to help loosen up mucus. Encouraged to d/c smoking. Continue Albuterol inhaler 2 puffs every 6 hours as needed and take Symbicort inhaler daily as directed. May also use OTC Delsym cough syrup or similar medication as needed. If cough and shortness of breath do not improve within 2 to 3 days, may start Z-pak as directed (Rx written). Follow-up with your PCP in 3 to 4 days if minimal improvement.     ED Prescriptions    Medication Sig Dispense Auth. Provider   albuterol (PROVENTIL HFA;VENTOLIN HFA) 108 (90 Base) MCG/ACT inhaler Inhale 2 puffs into the lungs every 6 (six) hours as needed for wheezing or shortness of breath. 1 Inhaler Tarini Carrier, Ali LoweAnn Berry, NP   predniSONE (STERAPRED UNI-PAK 21 TAB) 10 MG (21) TBPK tablet Take 6 tabs by mouth daily today then decrease by 1 tablet each day until finished on day 6. 21 tablet Sallee Hogrefe, Ali LoweAnn Berry, NP   azithromycin (ZITHROMAX) 250 MG tablet Take 1 tablet (250 mg total) by mouth daily. Take first 2 tablets together, then 1 every day until finished. 6 tablet Sudie GrumblingAmyot, Galen Malkowski Berry, NP     Controlled Substance Prescriptions [place] Controlled Substance Registry consulted? Not Applicable   Sudie Grumblingmyot, Axcel Horsch Berry, NP 06/29/18 [phone]

## 2018-06-29 ENCOUNTER — Encounter: Payer: Self-pay | Admitting: Family

## 2018-07-31 ENCOUNTER — Telehealth: Payer: Self-pay | Admitting: Family

## 2018-07-31 NOTE — Telephone Encounter (Signed)
Forwarding to Sarah.

## 2018-07-31 NOTE — Telephone Encounter (Signed)
Call pt   Your mammogram appears due.   Please let me know once you have scheduled.     

## 2018-08-03 NOTE — Telephone Encounter (Signed)
I called & LM for patient stating that he mammogram was over due & I asked her to call to schedule. I provided number on VM & stated that if she had any further questions for us she could call back our office,  

## 2018-08-20 ENCOUNTER — Encounter: Payer: Self-pay | Admitting: Family Medicine

## 2018-08-20 ENCOUNTER — Ambulatory Visit (INDEPENDENT_AMBULATORY_CARE_PROVIDER_SITE_OTHER): Payer: Managed Care, Other (non HMO) | Admitting: Family Medicine

## 2018-08-20 VITALS — BP 124/86 | HR 79 | Temp 99.1°F | Resp 16 | Ht 66.0 in | Wt 184.6 lb

## 2018-08-20 DIAGNOSIS — R059 Cough, unspecified: Secondary | ICD-10-CM

## 2018-08-20 DIAGNOSIS — J309 Allergic rhinitis, unspecified: Secondary | ICD-10-CM

## 2018-08-20 DIAGNOSIS — B3731 Acute candidiasis of vulva and vagina: Secondary | ICD-10-CM

## 2018-08-20 DIAGNOSIS — Z720 Tobacco use: Secondary | ICD-10-CM

## 2018-08-20 DIAGNOSIS — B373 Candidiasis of vulva and vagina: Secondary | ICD-10-CM

## 2018-08-20 DIAGNOSIS — R05 Cough: Secondary | ICD-10-CM

## 2018-08-20 DIAGNOSIS — J45909 Unspecified asthma, uncomplicated: Secondary | ICD-10-CM

## 2018-08-20 MED ORDER — BUDESONIDE-FORMOTEROL FUMARATE 80-4.5 MCG/ACT IN AERO: 2.0000 | INHALATION_SPRAY | Freq: Two times a day (BID) | RESPIRATORY_TRACT | 2 refills | Status: DC

## 2018-08-20 MED ORDER — FLUCONAZOLE 150 MG PO TABS: 150.0000 mg | ORAL_TABLET | Freq: Once | ORAL | 0 refills | Status: AC

## 2018-08-20 MED ORDER — PREDNISONE 10 MG (21) PO TBPK: ORAL_TABLET | ORAL | 0 refills | Status: DC

## 2018-08-20 MED ORDER — MONTELUKAST SODIUM 10 MG PO TABS: 10.0000 mg | ORAL_TABLET | Freq: Every day | ORAL | 3 refills | Status: DC

## 2018-08-20 MED ORDER — ALBUTEROL SULFATE HFA 108 (90 BASE) MCG/ACT IN AERS: 2.0000 | INHALATION_SPRAY | Freq: Four times a day (QID) | RESPIRATORY_TRACT | 1 refills | Status: DC | PRN

## 2018-08-20 NOTE — Patient Instructions (Signed)
Take your Symbicort inhaler 2 puffs 2 times a day every day for asthma stabilization.  Use albuterol inhaler as needed for breakthrough wheezing or shortness of breath.  Takes Singulair every day to help reduce allergen response symptoms.  Use Flonase nasal spray.

## 2018-08-20 NOTE — Progress Notes (Signed)
Subjective:    Patient ID: Jaclyn Lin, female    DOB: 16-Apr-1966, 53 y.o.   MRN: [id number]  HPI    Patient presents to clinic due to cough, congestion for the past 2 to 3 months.  Patient does have a history of asthma.  She is supposed to take Symbicort 2 puffs twice per day, but states she takes it maybe once per day, does not take Symbicort every day.  States she does use her albuterol inhaler 1 puff once daily.  Currently is not on any medications or nasal sprays.  Denies fever or chills.  Denies chest pain.  Denies nausea or vomiting.  States when she does take dose of her albuterol, shortness of breath and wheezing seem improved for short time.  Patient also complains of a thin white vaginal discharge that has been present for a few days.  Denies any possibility of STDs.  States it does not have an odor.  Patient Active Problem List   Diagnosis Date Noted  . Shortness of breath 03/09/2018  . Uncomplicated asthma 01/23/2018  . Soft tissue mass 01/23/2018  . Gastroesophageal reflux disease 02/10/2017  . BPPV (benign paroxysmal positional vertigo) 09/11/2015  . Chronic fatigue 01/27/2015  . Routine general medical examination at a health care facility 07/21/2014  . Prediabetes 12/17/2013  . Anxiety 04/29/2012  . Hypertension 05/24/2011   Social History   Tobacco Use  . Smoking status: Current Every Day Smoker    Packs/day: 0.50    Years: 20.00    Pack years: 10.00    Types: Cigarettes    Start date: 02/11/2016  . Smokeless tobacco: Never Used  . Tobacco comment: quit in Jan 2019 but has resumed   Substance Use Topics  . Alcohol use: Yes    Comment: Beer on weekends; 6 pack    Review of Systems  Constitutional: Negative for chills, fatigue and fever.  HENT: +sinus congestion, drainage Eyes: Negative.   Respiratory: +cough, shortness of breath and wheezing.   Cardiovascular: Negative for chest pain, palpitations and leg swelling.  Gastrointestinal: Negative  for abdominal pain, diarrhea, nausea and vomiting.  Genitourinary: Negative for dysuria, frequency and urgency. +thin white vaginal discharge  Musculoskeletal: Negative for arthralgias and myalgias.  Skin: Negative for color change, pallor and rash.  Neurological: Negative for syncope, light-headedness and headaches.  Psychiatric/Behavioral: The patient is not nervous/anxious.       Objective:   Physical Exam Vitals signs and nursing note reviewed.  Constitutional:      General: She is not in acute distress.    Appearance: She is not toxic-appearing.  HENT:     Head: Normocephalic.     Right Ear: Tympanic membrane, ear canal and external ear normal.     Left Ear: Tympanic membrane, ear canal and external ear normal.     Nose: Congestion and rhinorrhea present.     Comments: Clear drainage, post nasal drip, nasal mucosa inflamed    Mouth/Throat:     Mouth: Mucous membranes are moist.     Pharynx: No oropharyngeal exudate or posterior oropharyngeal erythema.  Eyes:     General: No scleral icterus.    Extraocular Movements: Extraocular movements intact.     Conjunctiva/sclera: Conjunctivae normal.  Neck:     Musculoskeletal: Neck supple. No neck rigidity.  Cardiovascular:     Rate and Rhythm: Normal rate and regular rhythm.  Pulmonary:     Effort: Pulmonary effort is normal. No respiratory distress.  Breath sounds: Wheezing (faint expiratory) present. No rhonchi or rales.  Lymphadenopathy:     Cervical: No cervical adenopathy.  Skin:    General: Skin is warm and dry.     Coloration: Skin is not jaundiced or pale.  Neurological:     Mental Status: She is alert and oriented to person, place, and time.  Psychiatric:        Mood and Affect: Mood normal.        Behavior: Behavior normal.    Vitals:   08/20/18 0925  BP: 124/86  Pulse: 79  Resp: 16  Temp: 99.1 F (37.3 C)  SpO2: 96%       Assessment & Plan:    Asthma- patient advised that she needs to be taking  her Symbicort inhaler 2 puffs 2 times every day for asthma stabilization.  Advised she is to use albuterol inhaler as needed for breakthrough wheezing or shortness of breath.  We will also add in Singulair every day to help reduce allergen trigger symptoms most likely are making her asthma symptoms worse.  Cough - Patient will use oral steroid taper.  Advised she can use over-the-counter Mucinex to help reduce cough symptoms.  Allergic rhinitis -Singulair should help nasal congestion  Vaginal Candida- patient will take oral Diflucan x1.  She declined GU/pelvic exam in clinic today.  Advised if the vaginal discharge does continue, she will need to return to clinic for pelvic exam.  Tobacco use-.  Discussion of smoking cessation had with patient that lasted longer than 3 minutes.  Recommended cutting back on cigarette smoking and eventually quitting completely.  Discussed different options including nicotine patches, nicotine gum, Chantix.  Patient is not ready to quit at this time.  Patient aware of long-term risk factors associated with smoking including breathing difficulties, skin or disease, cancers and even death.  Patient will follow-up in 4 to 6 weeks for recheck on her asthma symptoms.  Advised to return to clinic sooner if symptoms worsen or do not improve as expected.

## 2018-08-25 ENCOUNTER — Encounter: Payer: Self-pay | Admitting: Family

## 2018-08-25 NOTE — Telephone Encounter (Signed)
Sent to PCP to advise 

## 2018-09-04 ENCOUNTER — Ambulatory Visit (INDEPENDENT_AMBULATORY_CARE_PROVIDER_SITE_OTHER): Payer: Managed Care, Other (non HMO) | Admitting: Family

## 2018-09-04 ENCOUNTER — Encounter: Payer: Self-pay | Admitting: Family

## 2018-09-04 ENCOUNTER — Other Ambulatory Visit: Payer: Self-pay | Admitting: Family

## 2018-09-04 ENCOUNTER — Other Ambulatory Visit: Payer: Self-pay

## 2018-09-04 ENCOUNTER — Ambulatory Visit (INDEPENDENT_AMBULATORY_CARE_PROVIDER_SITE_OTHER): Payer: Managed Care, Other (non HMO)

## 2018-09-04 VITALS — BP 112/60 | HR 91 | Temp 98.2°F | Wt 183.8 lb

## 2018-09-04 DIAGNOSIS — Z1239 Encounter for other screening for malignant neoplasm of breast: Secondary | ICD-10-CM

## 2018-09-04 DIAGNOSIS — F419 Anxiety disorder, unspecified: Secondary | ICD-10-CM

## 2018-09-04 DIAGNOSIS — Z1211 Encounter for screening for malignant neoplasm of colon: Secondary | ICD-10-CM

## 2018-09-04 DIAGNOSIS — M79601 Pain in right arm: Secondary | ICD-10-CM

## 2018-09-04 DIAGNOSIS — J45909 Unspecified asthma, uncomplicated: Secondary | ICD-10-CM

## 2018-09-04 DIAGNOSIS — Z23 Encounter for immunization: Secondary | ICD-10-CM | POA: Diagnosis not present

## 2018-09-04 DIAGNOSIS — I10 Essential (primary) hypertension: Secondary | ICD-10-CM | POA: Diagnosis not present

## 2018-09-04 MED ORDER — MELOXICAM 7.5 MG PO TABS: 7.5000 mg | ORAL_TABLET | Freq: Every day | ORAL | 0 refills | Status: DC

## 2018-09-04 MED ORDER — SERTRALINE HCL 50 MG PO TABS: 50.0000 mg | ORAL_TABLET | Freq: Every day | ORAL | 3 refills | Status: DC

## 2018-09-04 NOTE — Patient Instructions (Addendum)
Trial of Zoloft at bedtime. Trial of meloxicam for right arm pain.  This is an anti-inflammatory.  Please do not take other anti-inflammatories including ibuprofen, Advil, Motrin, Goody powder etc.  Please take this medication with food.  Today we discussed referrals, orders.  X-rays of right arm, ultrasound of right arm, referral to orthopedics   I have placed these orders in the system for you.  Please be sure to give [place] a call if you have not heard from our office regarding this. We should hear from [place] within ONE week with information regarding your appointment. If not, please let me know immediately.    Our hope is for gradual improvement of mood since starting medication; however this may take several weeks.   If you start to have unusual thoughts, thoughts of hurting yourself, or anyone else, please go immediately to the emergency department.   Follow up in 2 months.    National Suicide Prevention Hotline - available 24 hours a day, 7 days a week.  [phone]  Major Depressive Disorder Major depressive disorder is a mental illness. It also may be called clinical depression or unipolar depression. Major depressive disorder usually causes feelings of sadness, hopelessness, or helplessness. Some people with this disorder do not feel particularly sad but lose interest in doing things they used to enjoy (anhedonia). Major depressive disorder also can cause physical symptoms. It can interfere with work, school, relationships, and other normal everyday activities. The disorder varies in severity but is longer lasting and more serious than the sadness we all feel from time to time in our lives. Major depressive disorder often is triggered by stressful life events or major life changes. Examples of these triggers include divorce, loss of your job or home, a move, and the death of a family member or close friend. Sometimes this disorder occurs for no obvious reason at all. People who have family  members with major depressive disorder or bipolar disorder are at higher risk for developing this disorder, with or without life stressors. Major depressive disorder can occur at any age. It may occur just once in your life (single episode major depressive disorder). It may occur multiple times (recurrent major depressive disorder). SYMPTOMS People with major depressive disorder have either anhedonia or depressed mood on nearly a daily basis for at least 2 weeks or longer. Symptoms of depressed mood include:  Feelings of sadness (blue or down in the dumps) or emptiness.  Feelings of hopelessness or helplessness.  Tearfulness or episodes of crying (may be observed by others).  Irritability (children and adolescents). In addition to depressed mood or anhedonia or both, people with this disorder have at least four of the following symptoms:  Difficulty sleeping or sleeping too much.   Significant change (increase or decrease) in appetite or weight.   Lack of energy or motivation.  Feelings of guilt and worthlessness.   Difficulty concentrating, remembering, or making decisions.  Unusually slow movement (psychomotor retardation) or restlessness (as observed by others).   Recurrent wishes for death, recurrent thoughts of self-harm (suicide), or a suicide attempt. People with major depressive disorder commonly have persistent negative thoughts about themselves, other people, and the world. People with severe major depressive disorder may experiencedistorted beliefs or perceptions about the world (psychotic delusions). They also may see or hear things that are not real (psychotic hallucinations). DIAGNOSIS Major depressive disorder is diagnosed through an assessment by your health care provider. Your health care provider will ask aboutaspects of your daily life, such  as mood,sleep, and appetite, to see if you have the diagnostic symptoms of major depressive disorder. Your health care  provider may ask about your medical history and use of alcohol or drugs, including prescription medicines. Your health care provider also may do a physical exam and blood work. This is because certain medical conditions and the use of certain substances can cause major depressive disorder-like symptoms (secondary depression). Your health care provider also may refer you to a mental health specialist for further evaluation and treatment. TREATMENT It is important to recognize the symptoms of major depressive disorder and seek treatment. The following treatments can be prescribed for this disorder:   Medicine. Antidepressant medicines usually are prescribed. Antidepressant medicines are thought to correct chemical imbalances in the brain that are commonly associated with major depressive disorder. Other types of medicine may be added if the symptoms do not respond to antidepressant medicines alone or if psychotic delusions or hallucinations occur.  Talk therapy. Talk therapy can be helpful in treating major depressive disorder by providing support, education, and guidance. Certain types of talk therapy also can help with negative thinking (cognitive behavioral therapy) and with relationship issues that trigger this disorder (interpersonal therapy). A mental health specialist can help determine which treatment is best for you. Most people with major depressive disorder do well with a combination of medicine and talk therapy. Treatments involving electrical stimulation of the brain can be used in situations with extremely severe symptoms or when medicine and talk therapy do not work over time. These treatments include electroconvulsive therapy, transcranial magnetic stimulation, and vagal nerve stimulation.   This information is not intended to replace advice given to you by your health care provider. Make sure you discuss any questions you have with your health care provider.   Document Released: 10/05/2012  Document Revised: 07/01/2014 Document Reviewed: 10/05/2012 Elsevier Interactive Patient Education [employer]

## 2018-09-04 NOTE — Progress Notes (Signed)
Subjective:    Patient ID: Jaclyn Lin, female    DOB: [birth year], 53 y.o.   MRN: [id number]  CC: Jaclyn Lin is a 53 y.o. female who presents today for follow up.   HPI: Complains of right arm ache, for a couple of weeks, worsening.  running the entirity of arm, worse along forearm. Pain wakes her up at night. No injury. No numbness.  Has noticed a small 'knot' on dorsal aspect of right arm.   No neck pain. Hasnt tried ibuprofen.   Left arm dominant.   Works in [employer], [employer]in [employer].   Anxiety and depression-would like to have FMLA paperwork in place. hasnt missed any work. Not sleeping well. Using melatonin, benadryl,  with some relief. Uses xanax for anxiety attack; uses very rarely.  has seen our counselor with some help.  No thoughts of hurting herself or anyone else.  Drinks three 12 ounces  Beer per night. Doesn't feel the needs to cut down on drinking alcohol.  People do not feel annoyed by alcohol use. Denies feeling guilty for alcohol consumption or having an eye opener.  HTN-compliant medication.  No chest pain  Asthma-doing well on symbicort.  No wheezing, shortness of breath.  Current smoker  [place] left arm 2019 suspected lipoma HISTORY:  Past Medical History:  Diagnosis Date  . Asthma    childhood  . Hypertension   . UTI (lower urinary tract infection)    Past Surgical History:  Procedure Laterality Date  . ABDOMINAL HYSTERECTOMY     partial, Dr. Haskel Khan  . VAGINAL DELIVERY     2   Family History  Problem Relation Age of Onset  . Hypertension Mother   . Cancer Maternal Grandfather        bone  . Asthma Maternal Grandmother   . Brain cancer Maternal Grandmother   . Asthma Son     Allergies: Codeine Current Outpatient Medications on File Prior to Visit  Medication Sig Dispense Refill  . albuterol (PROVENTIL HFA;VENTOLIN HFA) 108 (90 Base) MCG/ACT inhaler Inhale 2 puffs into the lungs every 6 (six) hours as needed for wheezing or  shortness of breath. 1 Inhaler 1  . ALPRAZolam (XANAX) 0.25 MG tablet Take 1 tablet (0.25 mg total) by mouth 2 (two) times daily as needed for sleep or anxiety. 60 tablet 1  . budesonide-formoterol (SYMBICORT) 80-4.5 MCG/ACT inhaler Inhale 2 puffs into the lungs 2 (two) times daily. 1 Inhaler 2  . montelukast (SINGULAIR) 10 MG tablet Take 1 tablet (10 mg total) by mouth at bedtime. 30 tablet 3  . triamterene-hydrochlorothiazide (DYAZIDE) 37.5-25 MG capsule TAKE 1 CAPSULE BY MOUTH ONCE DAILY 90 capsule 1   No current facility-administered medications on file prior to visit.     Social History   Tobacco Use  . Smoking status: Current Every Day Smoker    Packs/day: 0.50    Years: 20.00    Pack years: 10.00    Types: Cigarettes    Start date: 02/11/2016  . Smokeless tobacco: Never Used  . Tobacco comment: quit in Jan 2019 but has resumed   Substance Use Topics  . Alcohol use: Yes    Comment: Beer on weekends; 6 pack  . Drug use: No    Review of Systems  Constitutional: Negative for chills and fever.  Respiratory: Negative for cough.   Cardiovascular: Negative for chest pain and palpitations.  Gastrointestinal: Negative for nausea and vomiting.  Musculoskeletal: Positive for arthralgias. Negative for neck pain.  Neurological:  Negative for numbness.  Psychiatric/Behavioral: Negative for sleep disturbance and suicidal ideas. The patient is nervous/anxious.       Objective:    BP 112/60 (BP Location: Left Arm, Patient Position: Sitting, Cuff Size: Large)   Pulse 91   Temp 98.2 F (36.8 C)   Wt 183 lb 12.8 oz (83.4 kg)   SpO2 98%   BMI 29.67 kg/m  BP Readings from Last 3 Encounters:  09/04/18 112/60  08/20/18 124/86  06/28/18 135/87   Wt Readings from Last 3 Encounters:  09/04/18 183 lb 12.8 oz (83.4 kg)  08/20/18 184 lb 9.6 oz (83.7 kg)  06/28/18 180 lb (81.6 kg)    Physical Exam Vitals signs reviewed.  Constitutional:      Appearance: She is well-developed.  Eyes:      Conjunctiva/sclera: Conjunctivae normal.  Cardiovascular:     Rate and Rhythm: Normal rate and regular rhythm.     Pulses: Normal pulses.     Heart sounds: Normal heart sounds.  Pulmonary:     Effort: Pulmonary effort is normal.     Breath sounds: Normal breath sounds. No wheezing, rhonchi or rales.  Musculoskeletal:     Right shoulder: She exhibits normal range of motion, no tenderness, no pain and no spasm.     Left shoulder: She exhibits normal range of motion, no tenderness and no pain.     Right elbow: She exhibits normal range of motion and no swelling. No tenderness found.     Right wrist: She exhibits normal range of motion, no tenderness and no swelling.     Cervical back: She exhibits normal range of motion, no tenderness, no pain and no spasm.       Arms:     Comments: Right Shoulder:   No asymmetry of shoulders when comparing right and left.No pain with palpation over glenohumeral joint lines, Gore joint, AC joint, or bicipital groove. No pain with internal and external rotation. No pain with resisted lateral extension .   Negative active painful arc sign. Negative passive arc ( Neer's). Negative drop arm.  No pain, swelling, or ecchymosis noted over long head of biceps.  Mild  bicep pain with resisted flexion and extension.   Strength and sensation normal BUE's.  Discrete palpable mass right ventral side lateral arm.  Tenderness diffusely located on forearm.  Mass is Nonfluctuant.  No increased erythema or streaking. Right elbow: Full ROM with flexion, extension, supination, and pronation.  Strength 5/5. Negative Yergason's test.  No ecchymosis or swelling.  Grip strength normal.  Palpable radial pulses bilaterally.  Negative Tinel, Phalen's   Left arm palpable well-circumscribed mass lateral ventral side is noted on diagram.  No erythema, it is nontender.  Nonfluctuant.   Skin:    General: Skin is warm and dry.  Neurological:     Mental Status: She is alert.   Psychiatric:        Speech: Speech normal.        Behavior: Behavior normal.        Thought Content: Thought content normal.        Assessment & Plan:   Problem List Items Addressed This Visit      Cardiovascular and Mediastinum   Hypertension    Doing well on current medications.  No changes made.        Respiratory   Uncomplicated asthma     Other   Anxiety    Largely unchanged.  Trial of Zoloft.  Close follow-up  Relevant Medications   sertraline (ZOLOFT) 50 MG tablet   Right arm pain - Primary    Etiology nonspecific at this point.  She does have what appears to be a lipoma however typically do not suspect this to be tender.  Do suspect with her line of work she may have an overuse syndrome.  Reasonable to try meloxicam.  Pending x-ray, ultrasound and also orthopedic referral.  Of particular, want orthopedic to see both subcutaneous masses on left and right forearm.  Will follow      Relevant Medications   meloxicam (MOBIC) 7.5 MG tablet   Other Relevant Orders   [place] MiscellaneoUS Localization   Ambulatory referral to Orthopedic Surgery   DG Forearm Right (Completed)   DG Humerus Right (Completed)   Screening for breast cancer    Ordered, patient will schedule      Relevant Orders   MM 3D SCREEN BREAST BILATERAL    Other Visit Diagnoses    Screen for colon cancer       Relevant Orders   Ambulatory referral to Gastroenterology   Need for diphtheria-tetanus-pertussis (Tdap) vaccine       Relevant Orders   Tdap vaccine greater than or equal to 7yo IM (Completed)       I have discontinued Kathrin Penner. Wickard's azithromycin and predniSONE. I am also having her start on sertraline and meloxicam. Additionally, I am having her maintain her ALPRAZolam, triamterene-hydrochlorothiazide, albuterol, budesonide-formoterol, and montelukast.   Meds ordered this encounter  Medications  . sertraline (ZOLOFT) 50 MG tablet    Sig: Take 1 tablet (50 mg total) by  mouth at bedtime.    Dispense:  90 tablet    Refill:  3    Order Specific Question:   Supervising Provider    Answer:   Duncan Dull L [[id number]]  . meloxicam (MOBIC) 7.5 MG tablet    Sig: Take 1 tablet (7.5 mg total) by mouth daily.    Dispense:  30 tablet    Refill:  0    Order Specific Question:   Supervising Provider    Answer:   Sherlene Shams [[id number]]    Return precautions given.   Risks, benefits, and alternatives of the medications and treatment plan prescribed today were discussed, and patient expressed understanding.   Education regarding symptom management and diagnosis given to patient on AVS.  Continue to follow with Allegra Grana, FNP for routine health maintenance.   Jaclyn Lin and I agreed with plan.   Rennie Plowman, FNP

## 2018-09-07 DIAGNOSIS — M79601 Pain in right arm: Secondary | ICD-10-CM | POA: Insufficient documentation

## 2018-09-07 DIAGNOSIS — Z1239 Encounter for other screening for malignant neoplasm of breast: Secondary | ICD-10-CM | POA: Insufficient documentation

## 2018-09-07 NOTE — Assessment & Plan Note (Addendum)
Largely unchanged.  Trial of Zoloft.  Close follow-up

## 2018-09-07 NOTE — Assessment & Plan Note (Signed)
Ordered, patient will schedule 

## 2018-09-07 NOTE — Assessment & Plan Note (Signed)
Doing well on current medications.  No changes made.

## 2018-09-07 NOTE — Assessment & Plan Note (Signed)
Etiology nonspecific at this point.  She does have what appears to be a lipoma however typically do not suspect this to be tender.  Do suspect with her line of work she may have an overuse syndrome.  Reasonable to try meloxicam.  Pending x-ray, ultrasound and also orthopedic referral.  Of particular, want orthopedic to see both subcutaneous masses on left and right forearm.  Will follow

## 2018-09-09 ENCOUNTER — Telehealth: Payer: Self-pay

## 2018-09-09 NOTE — Telephone Encounter (Signed)
I called patient to let her know that FMLA paperwork was ready. She will pick it up at front desk. I have put up front in accordion folder.

## 2018-09-18 ENCOUNTER — Encounter: Payer: Self-pay | Admitting: Family

## 2018-09-23 ENCOUNTER — Other Ambulatory Visit: Payer: Self-pay | Admitting: Family

## 2018-09-23 DIAGNOSIS — M7989 Other specified soft tissue disorders: Secondary | ICD-10-CM

## 2018-09-24 ENCOUNTER — Telehealth: Payer: Self-pay

## 2018-09-24 DIAGNOSIS — M7989 Other specified soft tissue disorders: Secondary | ICD-10-CM

## 2018-09-24 NOTE — Telephone Encounter (Signed)
Order changed as per request from [facility]

## 2018-10-14 ENCOUNTER — Encounter: Payer: Self-pay | Admitting: Gastroenterology

## 2018-10-14 ENCOUNTER — Ambulatory Visit (INDEPENDENT_AMBULATORY_CARE_PROVIDER_SITE_OTHER): Payer: Self-pay | Admitting: Gastroenterology

## 2018-10-14 ENCOUNTER — Other Ambulatory Visit: Payer: Self-pay

## 2018-10-14 ENCOUNTER — Ambulatory Visit
Admission: RE | Admit: 2018-10-14 | Discharge: 2018-10-14 | Disposition: A | Discharge status: Home / Self Care | Payer: Managed Care, Other (non HMO) | Source: Ambulatory Visit | Attending: Family | Admitting: Family

## 2018-10-14 DIAGNOSIS — M7989 Other specified soft tissue disorders: Secondary | ICD-10-CM | POA: Insufficient documentation

## 2018-10-14 DIAGNOSIS — Z5329 Procedure and treatment not carried out because of patient's decision for other reasons: Secondary | ICD-10-CM

## 2018-10-16 ENCOUNTER — Encounter: Payer: Self-pay | Admitting: Family

## 2018-10-16 ENCOUNTER — Ambulatory Visit (INDEPENDENT_AMBULATORY_CARE_PROVIDER_SITE_OTHER): Payer: Managed Care, Other (non HMO) | Admitting: Family

## 2018-10-16 DIAGNOSIS — M79601 Pain in right arm: Secondary | ICD-10-CM | POA: Diagnosis not present

## 2018-10-16 NOTE — Patient Instructions (Addendum)
Stop mobic if not helping.   Please go to walk in orthopedic clinic as Im concerned for the pain.   Information below:  [facility] [address]  M-F 1-7:30pm  [phone]

## 2018-10-16 NOTE — Progress Notes (Signed)
This visit type was conducted due to national recommendations for restrictions regarding the COVID-19 pandemic (e.g. social distancing).  This format is felt to be most appropriate for this patient at this time.  All issues noted in this document were discussed and addressed.  No physical exam was performed (except for noted visual exam findings with Video Visits). Virtual Visit via Video Note  I connected with@  on 10/19/18 at  3:00 PM EDT by a video enabled telemedicine application and verified that I am speaking with the correct person using two identifiers.  Location patient: home Location provider:work  Persons participating in the virtual visit: patient, provider  I discussed the limitations of evaluation and management by telemedicine and the availability of in person appointments. The patient expressed understanding and agreed to proceed.  Interactive audio and video telecommunications were attempted between this provider and patient, however failed, due to patient having technical difficulties or patient did not have access to video capability.  We continued and completed visit with audio only.     HPI:  Chief complaint of right forearm pain x 2 months, worsening.  Describes pain primarily in wrist to to hand, and then extends to shoulder. Pain in lifting a glass of water.  No neck pain. Pain is worse between wrist and elbow. Appreciates right arm as larger than left forearm. No numbness, redness of arm, rash, or increased heat. Occasionally feels hot, 'anytime' more so at night. No weight loss, night sweats, sob, fever.   On mobic , tylenol without much relief.   Ultrasound of right upper arm is consistent with a small lipoma Normal XR humerus, forearm.    ROS: See pertinent positives and negatives per HPI.  Past Medical History:  Diagnosis Date  . Asthma    childhood  . Hypertension   . UTI (lower urinary tract infection)     Past Surgical History:  Procedure  Laterality Date  . ABDOMINAL HYSTERECTOMY     partial, Dr. Haskel Khan  . VAGINAL DELIVERY     2    Family History  Problem Relation Age of Onset  . Hypertension Mother   . Cancer Maternal Grandfather        bone  . Asthma Maternal Grandmother   . Brain cancer Maternal Grandmother   . Asthma Son     SOCIAL HX: current smoker   Current Outpatient Medications:  .  albuterol (PROVENTIL HFA;VENTOLIN HFA) 108 (90 Base) MCG/ACT inhaler, Inhale 2 puffs into the lungs every 6 (six) hours as needed for wheezing or shortness of breath., Disp: 1 Inhaler, Rfl: 1 .  ALPRAZolam (XANAX) 0.25 MG tablet, Take 1 tablet (0.25 mg total) by mouth 2 (two) times daily as needed for sleep or anxiety., Disp: 60 tablet, Rfl: 1 .  budesonide-formoterol (SYMBICORT) 80-4.5 MCG/ACT inhaler, Inhale 2 puffs into the lungs 2 (two) times daily., Disp: 1 Inhaler, Rfl: 2 .  meloxicam (MOBIC) 7.5 MG tablet, Take 1 tablet (7.5 mg total) by mouth daily., Disp: 30 tablet, Rfl: 0 .  montelukast (SINGULAIR) 10 MG tablet, Take 1 tablet (10 mg total) by mouth at bedtime., Disp: 30 tablet, Rfl: 3 .  sertraline (ZOLOFT) 50 MG tablet, Take 1 tablet (50 mg total) by mouth at bedtime., Disp: 90 tablet, Rfl: 3 .  triamterene-hydrochlorothiazide (DYAZIDE) 37.5-25 MG capsule, TAKE 1 CAPSULE BY MOUTH ONCE DAILY, Disp: 90 capsule, Rfl: 1  EXAM:  VITALS per patient if applicable:  GENERAL: alert, oriented, appears well and in no acute distress  HEENT:  atraumatic, conjunttiva clear, no obvious abnormalities on inspection of external nose and ears  NECK: normal movements of the head and neck  LUNGS: on inspection no signs of respiratory distress, breathing rate appears normal, no obvious gross SOB, gasping or wheezing  CV: no obvious cyanosis  MS: moves all visible extremities without noticeable abnormality  PSYCH/NEURO: pleasant and cooperative, no obvious depression or anxiety, speech and thought processing grossly  intact  ASSESSMENT AND PLAN:  Discussed the following assessment and plan:  Right arm pain  Problem List Items Addressed This Visit      Other   Right arm pain    Worsened of late. Advised that I do not suspect lipoma to be painful. Considering carpal tunnel etiology , however presentation is atypical. Concerned with increase in pain, interference with QOL and possible asymmetry of arm. Advised her to seek medical attention in which she could be seen in person. We jointly [facility]. Sarah ( CMA) called [facility] and they are aware patient is coming. Patient given directions/ phone number. Will follow           I discussed the assessment and treatment plan with the patient. The patient was provided an opportunity to ask questions and all were answered. The patient agreed with the plan and demonstrated an understanding of the instructions.   The patient was advised to call back or seek an in-person evaluation if the symptoms worsen or if the condition fails to improve as anticipated.   Rennie PlowmanMargaret Arnett, FNP

## 2018-10-19 ENCOUNTER — Encounter: Payer: Self-pay | Admitting: Family

## 2018-10-19 NOTE — Progress Notes (Signed)
LMTCB to let us know if she went to [facility] & how she is doing?

## 2018-10-19 NOTE — Assessment & Plan Note (Signed)
Worsened of late. Advised that I do not suspect lipoma to be painful. Considering carpal tunnel etiology , however presentation is atypical. Concerned with increase in pain, interference with QOL and possible asymmetry of arm. Advised her to seek medical attention in which she could be seen in person. We jointly agreed to be seen at [facility] today. Sarah ( CMA) called [facility] and they are aware patient is coming. Patient given directions/ phone number. Will follow

## 2018-10-26 ENCOUNTER — Telehealth: Payer: Self-pay | Admitting: Family

## 2018-10-26 NOTE — Telephone Encounter (Signed)
Call pt   How is she doing ?   Was she seen at [facility] for right arm pain ?  We havent gotten any notes just yet from them

## 2018-10-26 NOTE — Telephone Encounter (Signed)
Noted  Will see her 5/13

## 2018-11-04 ENCOUNTER — Ambulatory Visit: Payer: Self-pay | Admitting: Family

## 2018-11-29 ENCOUNTER — Other Ambulatory Visit: Payer: Self-pay | Admitting: Family

## 2018-11-29 DIAGNOSIS — M79601 Pain in right arm: Secondary | ICD-10-CM

## 2018-11-30 ENCOUNTER — Encounter: Payer: Self-pay | Admitting: Emergency Medicine

## 2018-11-30 ENCOUNTER — Other Ambulatory Visit: Payer: Self-pay

## 2018-11-30 ENCOUNTER — Ambulatory Visit
Admission: EM | Admit: 2018-11-30 | Discharge: 2018-11-30 | Disposition: A | Discharge status: Home / Self Care | Payer: Managed Care, Other (non HMO) | Attending: Family Medicine | Admitting: Family Medicine

## 2018-11-30 ENCOUNTER — Ambulatory Visit (INDEPENDENT_AMBULATORY_CARE_PROVIDER_SITE_OTHER): Payer: Managed Care, Other (non HMO)

## 2018-11-30 DIAGNOSIS — W19XXXA Unspecified fall, initial encounter: Secondary | ICD-10-CM

## 2018-11-30 DIAGNOSIS — M25562 Pain in left knee: Secondary | ICD-10-CM

## 2018-11-30 DIAGNOSIS — S8392XA Sprain of unspecified site of left knee, initial encounter: Secondary | ICD-10-CM | POA: Diagnosis not present

## 2018-11-30 MED ORDER — HYDROCODONE-ACETAMINOPHEN 5-325 MG PO TABS: ORAL_TABLET | ORAL | 0 refills | Status: DC

## 2018-11-30 NOTE — Telephone Encounter (Signed)
Call pt I refilled mobic How often does she use?  I dont have notes for when she saw Ortho for right arm pain after injections she had. Didn't she have f/u appt with them.   What did ortho say?  How is her pain?

## 2018-11-30 NOTE — ED Triage Notes (Signed)
Patient states that she slipped on some water outside and fell landed on her buttock.  Patient c/o left knee pain.

## 2018-11-30 NOTE — Telephone Encounter (Signed)
Pt states she is having a lot of pain in her arm and ortho did not give her a f/up appt, she also states she was in urgent care at the time I spoke with her because she fell.  She states she takes the mobic once daily if she has pain.  Nina,cma

## 2018-11-30 NOTE — ED Provider Notes (Signed)
[facility]    CSN: [id number] Arrival date & time: 11/30/18  1341     History   Chief Complaint Chief Complaint  Patient presents with  . Knee Pain    left  . Fall    HPI Jaclyn Lin is a 53 y.o. female.   53 yo female with a c/o left knee pain and swelling since slipping and falling at home. States she twisted and hit her knee as well. Has been able to ambulate, however states it's painful.    Knee Pain  Fall     Past Medical History:  Diagnosis Date  . Asthma    childhood  . Hypertension   . UTI (lower urinary tract infection)     Patient Active Problem List   Diagnosis Date Noted  . Right arm pain 09/07/2018  . Screening for breast cancer 09/07/2018  . Shortness of breath 03/09/2018  . Uncomplicated asthma 89/37/3428  . Soft tissue mass 01/23/2018  . Gastroesophageal reflux disease 02/10/2017  . BPPV (benign paroxysmal positional vertigo) 09/11/2015  . Chronic fatigue 01/27/2015  . Routine general medical examination at a health care facility 07/21/2014  . Prediabetes 12/17/2013  . Anxiety 04/29/2012  . Hypertension 05/24/2011    Past Surgical History:  Procedure Laterality Date  . ABDOMINAL HYSTERECTOMY     partial, Dr. Vernie Ammons  . VAGINAL DELIVERY     2    OB History   No obstetric history on file.      Home Medications    Prior to Admission medications   Medication Sig Start Date End Date Taking? Authorizing Provider  albuterol (PROVENTIL HFA;VENTOLIN HFA) 108 (90 Base) MCG/ACT inhaler Inhale 2 puffs into the lungs every 6 (six) hours as needed for wheezing or shortness of breath. 08/20/18  Yes Guse, Jacquelynn Cree, FNP  budesonide-formoterol (SYMBICORT) 80-4.5 MCG/ACT inhaler Inhale 2 puffs into the lungs 2 (two) times daily. 08/20/18  Yes Guse, Jacquelynn Cree, FNP  meloxicam (MOBIC) 7.5 MG tablet Take 1 tablet (7.5 mg total) by mouth daily as needed for pain. 11/30/18  Yes Arnett, Yvetta Coder, FNP  montelukast (SINGULAIR) 10 MG  tablet Take 1 tablet (10 mg total) by mouth at bedtime. 08/20/18  Yes Guse, Jacquelynn Cree, FNP  triamterene-hydrochlorothiazide (DYAZIDE) 37.5-25 MG capsule TAKE 1 CAPSULE BY MOUTH ONCE DAILY 06/25/18  Yes Arnett, Yvetta Coder, FNP  ALPRAZolam Duanne Moron) 0.25 MG tablet Take 1 tablet (0.25 mg total) by mouth 2 (two) times daily as needed for sleep or anxiety. 01/23/18   Burnard Hawthorne, FNP  HYDROcodone-acetaminophen (NORCO/VICODIN) 5-325 MG tablet 1-2 tabs po bid prn 11/30/18   Norval Gable, MD  sertraline (ZOLOFT) 50 MG tablet Take 1 tablet (50 mg total) by mouth at bedtime. 09/04/18   Burnard Hawthorne, FNP    Family History Family History  Problem Relation Age of Onset  . Hypertension Mother   . Cancer Maternal Grandfather        bone  . Asthma Maternal Grandmother   . Brain cancer Maternal Grandmother   . Asthma Son     Social History Social History   Tobacco Use  . Smoking status: Current Every Day Smoker    Packs/day: 0.50    Years: 20.00    Pack years: 10.00    Types: Cigarettes    Start date: 02/11/2016  . Smokeless tobacco: Never Used  . Tobacco comment: quit in Jan 2019 but has resumed   Substance Use Topics  . Alcohol use: Yes  Comment: Beer on weekends; 6 pack  . Drug use: No     Allergies   Codeine   Review of Systems Review of Systems   Physical Exam Triage Vital Signs ED Triage Vitals  Enc Vitals Group     BP 11/30/18 1358 126/81     Pulse Rate 11/30/18 1358 88     Resp 11/30/18 1358 16     Temp 11/30/18 1358 98.2 F (36.8 C)     Temp Source 11/30/18 1358 Oral     SpO2 11/30/18 1358 100 %     Weight 11/30/18 1355 175 lb (79.4 kg)     Height 11/30/18 1355 5\' 6"  (1.676 m)     Head Circumference --      Peak Flow --      Pain Score 11/30/18 1355 8     Pain Loc --      Pain Edu? --      Excl. in GC? --    No data found.  Updated Vital Signs BP 126/81 (BP Location: Left Arm)   Pulse 88   Temp 98.2 F (36.8 C) (Oral)   Resp 16   Ht 5\' 6"  (1.676  m)   Wt 79.4 kg   SpO2 100%   BMI 28.25 kg/m   Visual Acuity Right Eye Distance:   Left Eye Distance:   Bilateral Distance:    Right Eye Near:   Left Eye Near:    Bilateral Near:     Physical Exam Vitals signs and nursing note reviewed.  Constitutional:      General: She is not in acute distress.    Appearance: She is not toxic-appearing or diaphoretic.  Musculoskeletal:     Left knee: She exhibits swelling and bony tenderness. She exhibits normal range of motion, no ecchymosis, no deformity, no laceration, no erythema, normal alignment, no LCL laxity and normal patellar mobility. Tenderness found. Medial joint line and MCL tenderness noted.  Neurological:     Mental Status: She is alert.      UC Treatments / Results  Labs (all labs ordered are listed, but only abnormal results are displayed) Labs Reviewed - No data to display  EKG None  Radiology Dg Knee Complete 4 Views Left  Result Date: 11/30/2018 CLINICAL DATA:  Medial left knee injury after a fall yesterday EXAM: LEFT KNEE - COMPLETE 4+ VIEW COMPARISON:  None. FINDINGS: Mild spurring of the tibial spine and at the quadriceps attachment to the patella. No knee effusion or discrete fracture. Subtle medial compartmental articular space narrowing may reflect mild degenerative chondral thinning. IMPRESSION: 1. No acute bony findings.  No knee effusion identified. 2. Mild spurring of the tibial spine and of the patella. Electronically Signed   By: Gaylyn RongWalter  Liebkemann M.D.   On: 11/30/2018 16:43    Procedures Procedures (including critical care time)  Medications Ordered in UC Medications - No data to display  Initial Impression / Assessment and Plan / UC Course  I have reviewed the triage vital signs and the nursing notes.  Pertinent labs & imaging results that were available during my care of the patient were reviewed by me and considered in my medical decision making (see chart for details).      Final Clinical  Impressions(s) / UC Diagnoses   Final diagnoses:  Sprain of left knee, unspecified ligament, initial encounter  Fall, initial encounter    ED Prescriptions    Medication Sig Dispense Auth. Provider   HYDROcodone-acetaminophen (NORCO/VICODIN) 5-325 MG tablet  1-2 tabs po bid prn 8 tablet Payton Mccallumonty, Davanta Meuser, MD      1. x-ray results and diagnosis reviewed with patient 2. rx as per orders above; reviewed possible side effects, interactions, risks and benefits  3. Recommend supportive treatment with rest, ice, elevation 4. Follow-up prn if symptoms worsen or don't improve   Controlled Substance Prescriptions [place] Controlled Substance Registry consulted? Not Applicable   Payton Mccallumonty, [place] Skalicky, MD 11/30/18 [phone]

## 2018-12-04 NOTE — Telephone Encounter (Signed)
Call pt  I see she was in urgent care 11/30/18 In regards, to her right arm pain, I would like her to follow up with orthopedic.   Offer f/u here if she needs anything as happy to see as well

## 2018-12-14 NOTE — Telephone Encounter (Signed)
LMTCB to see how patient is doing & see if she needed to schedule f/u or be seen by orthopedics.

## 2018-12-15 ENCOUNTER — Other Ambulatory Visit: Payer: Self-pay | Admitting: Family

## 2018-12-15 DIAGNOSIS — I10 Essential (primary) hypertension: Secondary | ICD-10-CM

## 2018-12-23 ENCOUNTER — Other Ambulatory Visit: Payer: Self-pay | Admitting: Family Medicine

## 2018-12-23 DIAGNOSIS — J45909 Unspecified asthma, uncomplicated: Secondary | ICD-10-CM

## 2018-12-23 DIAGNOSIS — J309 Allergic rhinitis, unspecified: Secondary | ICD-10-CM

## 2019-02-17 ENCOUNTER — Ambulatory Visit (INDEPENDENT_AMBULATORY_CARE_PROVIDER_SITE_OTHER): Payer: Managed Care, Other (non HMO) | Admitting: Family

## 2019-02-17 ENCOUNTER — Encounter: Payer: Self-pay | Admitting: Family

## 2019-02-17 ENCOUNTER — Other Ambulatory Visit: Payer: Self-pay

## 2019-02-17 VITALS — BP 116/74 | HR 79 | Temp 97.7°F | Wt 175.2 lb

## 2019-02-17 DIAGNOSIS — R300 Dysuria: Secondary | ICD-10-CM

## 2019-02-17 DIAGNOSIS — I10 Essential (primary) hypertension: Secondary | ICD-10-CM | POA: Diagnosis not present

## 2019-02-17 DIAGNOSIS — Z1239 Encounter for other screening for malignant neoplasm of breast: Secondary | ICD-10-CM | POA: Diagnosis not present

## 2019-02-17 DIAGNOSIS — M25562 Pain in left knee: Secondary | ICD-10-CM

## 2019-02-17 DIAGNOSIS — M79601 Pain in right arm: Secondary | ICD-10-CM

## 2019-02-17 DIAGNOSIS — Z1211 Encounter for screening for malignant neoplasm of colon: Secondary | ICD-10-CM | POA: Diagnosis not present

## 2019-02-17 DIAGNOSIS — R102 Pelvic and perineal pain: Secondary | ICD-10-CM

## 2019-02-17 DIAGNOSIS — F419 Anxiety disorder, unspecified: Secondary | ICD-10-CM

## 2019-02-17 LAB — POCT URINALYSIS DIPSTICK
Glucose, UA: NEGATIVE
Ketones, UA: NEGATIVE
Leukocytes, UA: NEGATIVE
Nitrite, UA: NEGATIVE
Protein, UA: NEGATIVE
Spec Grav, UA: >=1.03 — AB (ref 1.010–1.025)
Urobilinogen, UA: 0.2 E.U./dL
pH, UA: 5.5 (ref 5.0–8.0)

## 2019-02-17 MED ORDER — ALPRAZOLAM 0.25 MG PO TABS: 0.2500 mg | ORAL_TABLET | Freq: Every day | ORAL | 1 refills | Status: DC | PRN

## 2019-02-17 NOTE — Patient Instructions (Addendum)
I would like you to see Dr Frederico Hamman Copland at our [place] location; he is great and focused on sports medicine. Please discuss your RIGHT shoulder pain and left knee pain.   Continue mobic however stop all other anti inflammatories such as aleve, ibuprofen.  Let me know how you are doing and particularly if you dont have relief after seeing him.  Please call call and schedule your 3D mammogram as discussed.   [place]  [place], [place]   Today we discussed referrals, orders. colonoscopy  I have placed these orders in the system for you.  Please be sure to give [place] a call if you have not heard from our office regarding this. We should hear from [place] within ONE week with information regarding your appointment. If not, please let me know immediately.      Stay safe!

## 2019-02-17 NOTE — Assessment & Plan Note (Addendum)
Etiology still nonspecific and no improvement with injection from [facility]. ? Elbow tendinopathy. Advised dedicated US to right arm for suspect lipoma ( she has similar mass on left arm) .  Consult with Dr Frederico Hamman copland. Continue mobic; advised to d/c all other NSAIDS ( Aleve) since of the same drug class.   encouraged icing regimen.  She will let me know how she doing

## 2019-02-17 NOTE — Assessment & Plan Note (Addendum)
Unchanged from fall. Medial pain. ? Meniscal etiology. She will continue mobic; advised icing regimen and she has been scheduled with Dr Lorelei Pont. Will follow.

## 2019-02-17 NOTE — Assessment & Plan Note (Signed)
Doing well on zoloft. Very rare use of xanax prn. I looked up patient on [place] Controlled Substances Reporting System and saw no activity that raised concern of inappropriate use.  Refilled.

## 2019-02-17 NOTE — Progress Notes (Signed)
Subjective:    Patient ID: Jaclyn Lin, female    DOB: September 06, 1965, 53 y.o.   MRN: [id number]  CC: Jaclyn Lin is a 53 y.o. female who presents today for follow up.   HPI: [id number] knee pain after sustaining fall of left knee 2 months ago. The pain has been unchanged.  She describes slipping on  water when she was preparing a pool her granddaughter.  She fell on concrete and landed on her left knee.  It was a twisting sort of injury she describes.  No head injury or loss of consciousness. She describes the knee is tender medial thigh.  There is no swelling or increased heat no rash.  Describes as a dull ache.  She been taking meloxicam and also Aleve with minimal relief.  No h/o GIB.  Pain is worse after long periods of sitting or stairs and bending.  It has not been giving out.  Seen at urgent care at this time.  Wearing knee brace which seems helpful.   She continues to have right arm pain describes as a deep ache which is been constant.  Not worsened with movement or rest.  Seems to be there all the time.  She was seen at [facility] and had a steroid injection she describes in the elbow.  She denies any swelling, heat.  No neck pain or numbness in the arms.  No weakness in the arms. She is right handed.   H/o of bilateral arm massess; suspected to be lipoma. Remain non tender. She feels the right arm mass has grown in size.   She also complains of suprapubic pressure for the past 2 weeks off and on, none today.  She denies abdominal pain, fever hematuria, constipation, changes to vaginal discharge.   Depression and anxiety-she feels well on the Zoloft dose.  Continues to be helpful.  She very rarely uses Xanax however would like a refill today.    Urgent care left knee sprain 11/2018. XR left knee showed spurring. No effusion, fracture  01/2018 [place] of left arm - most consistent with simple lipoma 08/2018 XR humerus and forearm, negative HISTORY:  Past Medical History:   Diagnosis Date  . Asthma    childhood  . Hypertension   . UTI (lower urinary tract infection)    Past Surgical History:  Procedure Laterality Date  . ABDOMINAL HYSTERECTOMY     partial, Dr. Vernie Ammons  . VAGINAL DELIVERY     2   Family History  Problem Relation Age of Onset  . Hypertension Mother   . Cancer Maternal Grandfather        bone  . Asthma Maternal Grandmother   . Brain cancer Maternal Grandmother   . Asthma Son     Allergies: Codeine Current Outpatient Medications on File Prior to Visit  Medication Sig Dispense Refill  . albuterol (PROVENTIL HFA;VENTOLIN HFA) 108 (90 Base) MCG/ACT inhaler Inhale 2 puffs into the lungs every 6 (six) hours as needed for wheezing or shortness of breath. 1 Inhaler 1  . budesonide-formoterol (SYMBICORT) 80-4.5 MCG/ACT inhaler Inhale 2 puffs into the lungs 2 (two) times daily. 1 Inhaler 2  . meloxicam (MOBIC) 7.5 MG tablet Take 1 tablet (7.5 mg total) by mouth daily as needed for pain. 90 tablet 0  . montelukast (SINGULAIR) 10 MG tablet TAKE 1 TABLET(10 MG) BY MOUTH AT BEDTIME 30 tablet 3  . sertraline (ZOLOFT) 50 MG tablet Take 1 tablet (50 mg total) by mouth at bedtime. [place]  tablet 3  . triamterene-hydrochlorothiazide (DYAZIDE) 37.5-25 MG capsule TAKE 1 CAPSULE BY MOUTH EVERY DAY 90 capsule 1   No current facility-administered medications on file prior to visit.     Social History   Tobacco Use  . Smoking status: Current Every Day Smoker    Packs/day: 0.50    Years: 20.00    Pack years: 10.00    Types: Cigarettes    Start date: 02/11/2016  . Smokeless tobacco: Never Used  . Tobacco comment: quit in Jan 2019 but has resumed   Substance Use Topics  . Alcohol use: Yes    Comment: Beer on weekends; 6 pack  . Drug use: No    Review of Systems  Constitutional: Negative for chills and fever.  Respiratory: Negative for cough.   Cardiovascular: Negative for chest pain and palpitations.  Gastrointestinal: Negative for abdominal  distention, abdominal pain, constipation, nausea and vomiting.  Genitourinary: Negative for difficulty urinating, dysuria and pelvic pain.  Musculoskeletal: Positive for arthralgias (left knee, right arm).  Neurological: Negative for dizziness, numbness and headaches.  Psychiatric/Behavioral: The patient is not nervous/anxious.       Objective:    BP 116/74   Pulse 79   Temp 97.7 F (36.5 C)   Wt 175 lb 3.2 oz (79.5 kg)   SpO2 98%   BMI 28.28 kg/m  BP Readings from Last 3 Encounters:  02/17/19 116/74  11/30/18 126/81  09/04/18 112/60   Wt Readings from Last 3 Encounters:  02/17/19 175 lb 3.2 oz (79.5 kg)  11/30/18 175 lb (79.4 kg)  09/04/18 183 lb 12.8 oz (83.4 kg)    Physical Exam Vitals signs reviewed.  Constitutional:      Appearance: She is well-developed.  Eyes:     Conjunctiva/sclera: Conjunctivae normal.  Cardiovascular:     Rate and Rhythm: Normal rate and regular rhythm.     Pulses: Normal pulses.     Heart sounds: Normal heart sounds.  Pulmonary:     Effort: Pulmonary effort is normal.     Breath sounds: Normal breath sounds. No wheezing, rhonchi or rales.  Musculoskeletal:     Right elbow: She exhibits normal range of motion, no swelling and no effusion. No tenderness found.     Left knee: She exhibits normal range of motion, no swelling, no effusion and no ecchymosis. Tenderness found. Medial joint line tenderness noted.       Arms:     Comments: Right forearm well circumscribed mass approx 2-3 cm , non tender, non fluctuant.   Left forearm mass approx 4-5cm, non tender, non fluctuant.   Full ROM with flexion, extension, supination, and pronation.  Strength 5/5. No ecchymosis or swelling.  Bilateral knees are symmetric. No effusion appreciated. No increase in warmth or erythema. Crepitus felt with flexion of bilateral knees.  Left knee:  Able to extend to -5 to 10 degrees and flex to 110 degrees, some pain elicited with flexion. No catching with  McMurray maneuver. No patellar apprehension. Negative anterior drawer and lachman's- no laxity appreciated.  No calf tenderness of lower leg edema bilaterally.    Skin:    General: Skin is warm and dry.  Neurological:     Mental Status: She is alert.  Psychiatric:        Speech: Speech normal.        Behavior: Behavior normal.        Thought Content: Thought content normal.        Assessment & Plan:  Problem List Items Addressed This Visit      Cardiovascular and Mediastinum   Hypertension - Primary   Relevant Orders   Comp Met (CMET) (Completed)     Other   Anxiety    Doing well on zoloft. Very rare use of xanax prn. I looked up patient on [place] Controlled Substances Reporting System and saw no activity that raised concern of inappropriate use.  Refilled.       Relevant Medications   ALPRAZolam (XANAX) 0.25 MG tablet   Right arm pain    Etiology still nonspecific and no improvement with injection from [facility]. ? Elbow tendinopathy. Advised dedicated US to right arm for suspect lipoma ( she has similar mass on left arm) .  Consult with Dr Frederico Hamman copland. Continue mobic; advised to d/c all other NSAIDS ( Aleve) since of the same drug class.   encouraged icing regimen.  She will let me know how she doing      Screening for breast cancer   Relevant Orders   MM 3D SCREEN BREAST BILATERAL   Left knee pain    Unchanged from fall. Medial pain. ? Meniscal etiology. She will continue mobic; advised icing regimen and she has been scheduled with Dr Lorelei Pont. Will follow.      Suprapubic pressure    Resolved at this time.  UA dipstick shows blood.  Pending urinalysis with micro.  History of smoking.  Will follow       Other Visit Diagnoses    Dysuria       Relevant Orders   POCT Urinalysis Dipstick (Completed)   Urine Microscopic Only   Urine Culture   Screen for colon cancer       Relevant Orders   Ambulatory referral to Gastroenterology     Ordered mammogram,  patient understands to schedule.  I have discontinued Adryana Mogensen. Gorin's HYDROcodone-acetaminophen. I have also changed her ALPRAZolam. Additionally, I am having her maintain her albuterol, budesonide-formoterol, sertraline, meloxicam, triamterene-hydrochlorothiazide, and montelukast.   Meds ordered this encounter  Medications  . ALPRAZolam (XANAX) 0.25 MG tablet    Sig: Take 1 tablet (0.25 mg total) by mouth daily as needed for anxiety or sleep.    Dispense:  30 tablet    Refill:  1    Order Specific Question:   Supervising Provider    Answer:   Crecencio Mc [[id number]]    Return precautions given.   Risks, benefits, and alternatives of the medications and treatment plan prescribed today were discussed, and patient expressed understanding.   Education regarding symptom management and diagnosis given to patient on AVS.  Continue to follow with Burnard Hawthorne, FNP for routine health maintenance.   Jaclyn Lin and I agreed with plan.   Mable Paris, FNP

## 2019-02-18 ENCOUNTER — Telehealth: Payer: Self-pay

## 2019-02-18 DIAGNOSIS — R102 Pelvic and perineal pain: Secondary | ICD-10-CM | POA: Insufficient documentation

## 2019-02-18 DIAGNOSIS — M7989 Other specified soft tissue disorders: Secondary | ICD-10-CM

## 2019-02-18 DIAGNOSIS — R1024 Suprapubic pain: Secondary | ICD-10-CM | POA: Insufficient documentation

## 2019-02-18 LAB — COMPREHENSIVE METABOLIC PANEL
AG Ratio: 1.6 (calc) (ref 1.0–2.5)
ALT: 11 U/L (ref 6–29)
AST: 14 U/L (ref 10–35)
Albumin: 4.4 g/dL (ref 3.6–5.1)
Alkaline phosphatase (APISO): 54 U/L (ref 37–153)
BUN: 13 mg/dL (ref 7–25)
CO2: 29 mmol/L (ref 20–32)
Calcium: 9.6 mg/dL (ref 8.6–10.4)
Chloride: 101 mmol/L (ref 98–110)
Creat: 0.72 mg/dL (ref 0.50–1.05)
Globulin: 2.7 g/dL (calc) (ref 1.9–3.7)
Glucose, Bld: 98 mg/dL (ref 65–99)
Potassium: 3.4 mmol/L — ABNORMAL LOW (ref 3.5–5.3)
Sodium: 138 mmol/L (ref 135–146)
Total Bilirubin: 0.4 mg/dL (ref 0.2–1.2)
Total Protein: 7.1 g/dL (ref 6.1–8.1)

## 2019-02-18 LAB — URINALYSIS, MICROSCOPIC ONLY: RBC / HPF: NONE SEEN (ref 0–?)

## 2019-02-18 NOTE — Assessment & Plan Note (Signed)
Resolved at this time.  UA dipstick shows blood.  Pending urinalysis with micro.  History of smoking.  Will follow

## 2019-02-18 NOTE — Telephone Encounter (Signed)
I spoke with patient & she would like to pursue [place] of right arm mass since this has not been done.

## 2019-02-19 ENCOUNTER — Encounter: Payer: Self-pay | Admitting: Family

## 2019-02-19 ENCOUNTER — Other Ambulatory Visit: Payer: Self-pay | Admitting: Family

## 2019-02-19 DIAGNOSIS — R899 Unspecified abnormal finding in specimens from other organs, systems and tissues: Secondary | ICD-10-CM

## 2019-02-19 LAB — URINE CULTURE
MICRO NUMBER:: 814125
SPECIMEN QUALITY:: ADEQUATE

## 2019-02-19 NOTE — Telephone Encounter (Signed)
Patient notified

## 2019-02-19 NOTE — Telephone Encounter (Signed)
Call pt I ordered LEFT ultrasound as we did RIGHT ultrasound 09/2018  Advise her to call us in one week if not been scheduled

## 2019-02-19 NOTE — Addendum Note (Signed)
Addended by: Burnard Hawthorne on: 02/19/2019 10:53 AM   Modules accepted: Orders

## 2019-02-22 ENCOUNTER — Ambulatory Visit (INDEPENDENT_AMBULATORY_CARE_PROVIDER_SITE_OTHER): Payer: Managed Care, Other (non HMO) | Admitting: Family Medicine

## 2019-02-22 ENCOUNTER — Telehealth: Payer: Self-pay

## 2019-02-22 ENCOUNTER — Other Ambulatory Visit: Payer: Self-pay

## 2019-02-22 ENCOUNTER — Encounter: Payer: Self-pay | Admitting: Family Medicine

## 2019-02-22 VITALS — BP 122/76 | HR 103 | Temp 97.6°F | Ht 66.0 in | Wt 175.0 lb

## 2019-02-22 DIAGNOSIS — M1712 Unilateral primary osteoarthritis, left knee: Secondary | ICD-10-CM | POA: Diagnosis not present

## 2019-02-22 DIAGNOSIS — M25611 Stiffness of right shoulder, not elsewhere classified: Secondary | ICD-10-CM

## 2019-02-22 DIAGNOSIS — M7122 Synovial cyst of popliteal space [Baker], left knee: Secondary | ICD-10-CM

## 2019-02-22 DIAGNOSIS — M7711 Lateral epicondylitis, right elbow: Secondary | ICD-10-CM | POA: Diagnosis not present

## 2019-02-22 DIAGNOSIS — Z1211 Encounter for screening for malignant neoplasm of colon: Secondary | ICD-10-CM

## 2019-02-22 DIAGNOSIS — M65332 Trigger finger, left middle finger: Secondary | ICD-10-CM

## 2019-02-22 DIAGNOSIS — M2392 Unspecified internal derangement of left knee: Secondary | ICD-10-CM | POA: Diagnosis not present

## 2019-02-22 MED ORDER — NA SULFATE-K SULFATE-MG SULF 17.5-3.13-1.6 GM/177ML PO SOLN: 1.0000 | Freq: Once | ORAL | 0 refills | Status: AC

## 2019-02-22 MED ORDER — METHYLPREDNISOLONE ACETATE 40 MG/ML IJ SUSP
80.0000 mg | Freq: Once | INTRAMUSCULAR | Status: AC
  Administered 2019-02-22: 80 mg via INTRA_ARTICULAR

## 2019-02-22 MED ORDER — PREDNISONE 20 MG PO TABS: ORAL_TABLET | ORAL | 0 refills | Status: DC

## 2019-02-22 NOTE — Patient Instructions (Addendum)
Right sided tennis elbow Right sided shoulder stiffness (hopefully not early frozen shoulder) Left trigger finger  L knee knee pain with popliteal / baker's cyst   START YOUR PREDNISONE ON Thursday

## 2019-02-22 NOTE — Progress Notes (Addendum)
Jaclyn Sowder T. Rutilio Yellowhair, MD Primary Care and [place] at [facility] [place], [address] Phone: [phone]  FAX: [place] - 53 y.o. female  MRN [id number]  Date of Birth: 1965-07-20  Visit Date: 02/22/2019  PCP: Burnard Hawthorne, FNP  Referred by: Burnard Hawthorne, FNP  Chief Complaint  Patient presents with  . Arm Pain    Right  . Knee Pain    Left   Subjective:   Jaclyn Lin is a 53 y.o. very pleasant female patient with Body mass index is 28.25 kg/m. who presents with the following:  She is a very nice lady seen today courtesy of Mrs. Vidal Schwalbe, [place], from our [place] office.   She really has several complaints today.  Her right shoulder and her left knee hurt the worst.  Her left knee hurts and she fell 2 or 3 months ago while she was wearing some flip-flops.  At that point she struck her knee.  On November 30, 2018 the patient had a 4 view trauma series of her left knee.  She is still having posterior medial joint line tenderness, and she has a popliteal fullness.  The radiological images were independently reviewed by myself in the office and results were reviewed with the patient. My independent interpretation of images:   There is no evidence for fracture or dislocation.  There is some minimal to mild joint space narrowing medially.  There is no apparent significant subchondral sclerosis.  This is consistent with medial compartmental osteoarthritis, mild.  Her right arm is been hurting her quite a long time.  This is concentrated at the right lateral elbow as well as pain with terminal motion on the right upper extremity at the shoulder.  She previously saw Mr. Ronnald Ramp at [facility], and she got a lateral epicondylar injection of corticosteroid.  This did not really help the patient and she still is having pain in this region.  She also notes that she is had some  triggering of her third digit on the left hand for quite some time.  This will stick in the morning and she has to manually on pop it.  R LE R UE stiffness, loss of motion L 3rd trigger  L knee pop cyst and medial joint line pain  L knee hurts the worst  Inject L knee  Oral course of steroids    Past Medical History, Surgical History, Social History, Family History, Problem List, Medications, and Allergies have been reviewed and updated if relevant.  Patient Active Problem List   Diagnosis Date Noted  . Suprapubic pressure 02/18/2019  . Left knee pain 02/17/2019  . Right arm pain 09/07/2018  . Screening for breast cancer 09/07/2018  . Shortness of breath 03/09/2018  . Uncomplicated asthma 14/23/9532  . Soft tissue mass 01/23/2018  . Gastroesophageal reflux disease 02/10/2017  . BPPV (benign paroxysmal positional vertigo) 09/11/2015  . Chronic fatigue 01/27/2015  . Routine general medical examination at a health care facility 07/21/2014  . Prediabetes 12/17/2013  . Anxiety 04/29/2012  . Hypertension 05/24/2011    Past Medical History:  Diagnosis Date  . Asthma    childhood  . Hypertension   . UTI (lower urinary tract infection)     Past Surgical History:  Procedure Laterality Date  . ABDOMINAL HYSTERECTOMY     partial, Dr. Vernie Ammons  . VAGINAL DELIVERY     2    Social History  Socioeconomic History  . Marital status: Married    Spouse name: Not on file  . Number of children: Not on file  . Years of education: Not on file  . Highest education level: Not on file  Occupational History  . Not on file  Social Needs  . Financial resource strain: Not on file  . Food insecurity    Worry: Not on file    Inability: Not on file  . Transportation needs    Medical: Not on file    Non-medical: Not on file  Tobacco Use  . Smoking status: Current Every Day Smoker    Packs/day: 0.50    Years: 20.00    Pack years: 10.00    Types: Cigarettes    Start date:  02/11/2016  . Smokeless tobacco: Never Used  . Tobacco comment: quit in Jan 2019 but has resumed   Substance and Sexual Activity  . Alcohol use: Yes    Comment: Beer on weekends; 6 pack  . Drug use: No  . Sexual activity: Yes    Birth control/protection: Condom  Lifestyle  . Physical activity    Days per week: Not on file    Minutes per session: Not on file  . Stress: Not on file  Relationships  . Social Herbalist on phone: Not on file    Gets together: Not on file    Attends religious service: Not on file    Active member of club or organization: Not on file    Attends meetings of clubs or organizations: Not on file    Relationship status: Not on file  . Intimate partner violence    Fear of current or ex partner: Not on file    Emotionally abused: Not on file    Physically abused: Not on file    Forced sexual activity: Not on file  Other Topics Concern  . Not on file  Social History Narrative   Works at [employer] and [employer].   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 Had two sons however lost one son due to gun.       Fun-enjoys dancing and singing      Diet- regular   Exercise-not right now          Family History  Problem Relation Age of Onset  . Hypertension Mother   . Cancer Maternal Grandfather        bone  . Asthma Maternal Grandmother   . Brain cancer Maternal Grandmother   . Asthma Son     Allergies  Allergen Reactions  . Codeine Itching    Medication list reviewed and updated in full in [place].  GEN: No fevers, chills. Nontoxic. Primarily MSK c/o today. MSK: Detailed in the HPI GI: tolerating PO intake without difficulty Neuro: No numbness, parasthesias, or tingling associated. Otherwise the pertinent positives of the ROS are noted above.   Objective:   BP 122/76   Pulse (!) 103   Temp  97.6 F (36.4 C) (Temporal)   Ht '5\' 6"'  (1.676 m)   Wt 175 lb (79.4 kg)   SpO2 96%   BMI 28.25 kg/m    GEN: WDWN, NAD, Non-toxic, Alert & Oriented x 3 HEENT: Atraumatic, Normocephalic.  Ears and Nose: No external deformity. EXTR: No clubbing/cyanosis/edema NEURO: Normal  gait.  PSYCH: Normally interactive. Conversant. Not depressed or anxious appearing.  Calm demeanor.    Left knee: Full extension.  Flexion to approximately 112 degrees.  No pain with loading the medial or lateral patellar facets.  She does have notable medial joint line tenderness, significantly more posterior medially.  No significant anterior pain, and patellar tendon, quad tendon and the anserine bursa are all nontender.  No painful plica.  She does have a noticeable fairly large popliteal cyst.  ACL, PCL, MCL, and LCL are all stable.  McMurray's and flexion pinch do cause pain without mechanical symptoms.  Right shoulder: Strength is 5/5 in all directions.  She does have a painful arc of motion on the right. Compared to the contralateral side, the patient is only able to abduct her shoulder to 165 degrees.  Flexion to approximately 165 to 170 degrees.  External rotation is grossly equivocal with possibly 5 to 10 degrees less compared to the left.  Internal range of motion is 15 to 20 degrees while the shoulder is abducted to 90. Both Neer testing and Michel Bickers testing are grossly unremarkable.  Crossover test is unremarkable.  Right elbow with full extension and flexion.  Some pain with grip as well as extension at the wrist, most notably at the third digit.  Tender at the ECRB.  She does have very clear triggering at the left third digit.  Radiology: CLINICAL DATA:  Right arm pain   EXAM: RIGHT HUMERUS - 2+ VIEW   COMPARISON:  None.   FINDINGS: There is no evidence of fracture or other focal bone lesions. Soft tissues are unremarkable.   IMPRESSION: Negative.     Electronically Signed   By: Ulyses Jarred M.D.   On: 09/04/2018 17:21 CLINICAL DATA:  Medial left knee injury after a fall yesterday   EXAM: LEFT KNEE - COMPLETE 4+ VIEW   COMPARISON:  None.   FINDINGS: Mild spurring of the tibial spine and at the quadriceps attachment to the patella.   No knee effusion or discrete fracture.   Subtle medial compartmental articular space narrowing may reflect mild degenerative chondral thinning.   IMPRESSION: 1. No acute bony findings.  No knee effusion identified. 2. Mild spurring of the tibial spine and of the patella.     Electronically Signed   By: Van Clines M.D.   On: 11/30/2018 16:43   Assessment and Plan:     ICD-10-CM   1. Internal derangement of left knee  M23.92 methylPREDNISolone acetate (DEPO-MEDROL) injection 80 mg  2. Baker cyst, left  M71.22   3. Primary osteoarthritis of left knee  M17.12   4. Lateral epicondylitis of right elbow  M77.11   5. Shoulder stiffness, right  M25.611   6. Trigger finger, left middle finger  M65.332    Multiple musculoskeletal problems.  Essentially impossible to take care of all of these today.  Left knee with new onset pain on the medial compartment, posterior medial compartment.  She also has a very significant by history of Baker's cyst which is most commonly associated with a posterior medial meniscal tear.  I would inject this with some corticosteroid intra-articularly, see if we can calm this down today.  Classic lateral epicondylitis, refractory treatment up until now.  Shoulder stiffness on the right.  Hopefully this is only just some secondary loss of motion after her tennis elbow, but could represent early onset adhesive capsulitis.  Range of motion reviewed.  Trigger finger, left third, at follow-up we  will inject this with corticosteroid.  Oral steroids in about 3 days, may help with all above conditions.   Aspiration/Injection Procedure Note Jaclyn Lin [birth year] Date of procedure: 02/22/2019   Procedure: Large Joint Aspiration / Injection of Knee, LEFT Indications: Pain  Procedure Details Patient verbally consented to procedure. Risks (including potential rare risk of infection), benefits, and alternatives explained. Sterilely prepped with Chloraprep. Ethyl cholride used for anesthesia. 8 cc Lidocaine 1% mixed with 2 mL Depo-Medrol 40 mg injected using the anteromedial approach without difficulty. No complications with procedure and tolerated well. Patient had decreased pain post-injection. Medication: 2 mL of Depo-Medrol 40 mg, equaling Depo-Medrol 80 mg total   Follow-up: Return in about 4 weeks (around 03/22/2019).  To reassess and further treat the patient's shoulder and finger.  Meds ordered this encounter  Medications  . predniSONE (DELTASONE) 20 MG tablet    Sig: 2 tabs po daily for 5 days, then 1 tab po daily for 5 days    Dispense:  15 tablet    Refill:  0  . methylPREDNISolone acetate (DEPO-MEDROL) injection 80 mg   No orders of the defined types were placed in this encounter.   Signed,  Maud Deed. Ladarrell Cornwall, MD   Outpatient Encounter Medications as of 02/22/2019  Medication Sig  . albuterol (PROVENTIL HFA;VENTOLIN HFA) 108 (90 Base) MCG/ACT inhaler Inhale 2 puffs into the lungs every 6 (six) hours as needed for wheezing or shortness of breath.  . ALPRAZolam (XANAX) 0.25 MG tablet Take 1 tablet (0.25 mg total) by mouth daily as needed for anxiety or sleep.  . budesonide-formoterol (SYMBICORT) 80-4.5 MCG/ACT inhaler Inhale 2 puffs into the lungs 2 (two) times daily.  . meloxicam (MOBIC) 7.5 MG tablet Take 1 tablet (7.5 mg total) by mouth daily as needed for pain.  . montelukast (SINGULAIR) 10 MG tablet TAKE 1 TABLET(10 MG) BY MOUTH AT BEDTIME  . [EXPIRED] Na Sulfate-K Sulfate-Mg Sulf 17.5-3.13-1.6 GM/177ML SOLN Take 1 kit by mouth once for 1 dose.  . sertraline (ZOLOFT) 50 MG tablet Take 1 tablet (50 mg total) by mouth at bedtime.  .  triamterene-hydrochlorothiazide (DYAZIDE) 37.5-25 MG capsule TAKE 1 CAPSULE BY MOUTH EVERY DAY  . predniSONE (DELTASONE) 20 MG tablet 2 tabs po daily for 5 days, then 1 tab po daily for 5 days  . [EXPIRED] methylPREDNISolone acetate (DEPO-MEDROL) injection 80 mg    No facility-administered encounter medications on file as of 02/22/2019.

## 2019-02-22 NOTE — Telephone Encounter (Signed)
Gastroenterology Pre-Procedure Review  Request Date: 03/11/19 Requesting Physician: Dr. Vicente Males  PATIENT REVIEW QUESTIONS: The patient responded to the following health history questions as indicated:    1. Are you having any GI issues? no 2. Do you have a personal history of Polyps? no 3. Do you have a family history of Colon Cancer or Polyps? no 4. Diabetes Mellitus? no 5. Joint replacements in the past 12 months?no 6. Major health problems in the past 3 months?no 7. Any artificial heart valves, MVP, or defibrillator?no    MEDICATIONS & ALLERGIES:    Patient reports the following regarding taking any anticoagulation/antiplatelet therapy:   Plavix, Coumadin, Eliquis, Xarelto, Lovenox, Pradaxa, Brilinta, or Effient? no Aspirin? no  Patient confirms/reports the following medications:  Current Outpatient Medications  Medication Sig Dispense Refill  . albuterol (PROVENTIL HFA;VENTOLIN HFA) 108 (90 Base) MCG/ACT inhaler Inhale 2 puffs into the lungs every 6 (six) hours as needed for wheezing or shortness of breath. 1 Inhaler 1  . ALPRAZolam (XANAX) 0.25 MG tablet Take 1 tablet (0.25 mg total) by mouth daily as needed for anxiety or sleep. 30 tablet 1  . budesonide-formoterol (SYMBICORT) 80-4.5 MCG/ACT inhaler Inhale 2 puffs into the lungs 2 (two) times daily. 1 Inhaler 2  . meloxicam (MOBIC) 7.5 MG tablet Take 1 tablet (7.5 mg total) by mouth daily as needed for pain. 90 tablet 0  . montelukast (SINGULAIR) 10 MG tablet TAKE 1 TABLET(10 MG) BY MOUTH AT BEDTIME 30 tablet 3  . Na Sulfate-K Sulfate-Mg Sulf 17.5-3.13-1.6 GM/177ML SOLN Take 1 kit by mouth once for 1 dose. 354 mL 0  . sertraline (ZOLOFT) 50 MG tablet Take 1 tablet (50 mg total) by mouth at bedtime. 90 tablet 3  . triamterene-hydrochlorothiazide (DYAZIDE) 37.5-25 MG capsule TAKE 1 CAPSULE BY MOUTH EVERY DAY 90 capsule 1   No current facility-administered medications for this visit.     Patient confirms/reports the following  allergies:  Allergies  Allergen Reactions  . Codeine Itching    No orders of the defined types were placed in this encounter.   AUTHORIZATION INFORMATION Primary Insurance: 1D#: Group #:  Secondary Insurance: 1D#: Group #:  SCHEDULE INFORMATION: Date: 03/11/19 Time: Location:[facility]

## 2019-03-03 ENCOUNTER — Other Ambulatory Visit: Payer: Managed Care, Other (non HMO)

## 2019-03-03 NOTE — Addendum Note (Signed)
Addended by: Leeanne Rio on: 03/03/2019 04:05 PM   Modules accepted: Orders

## 2019-03-04 ENCOUNTER — Ambulatory Visit: Payer: Managed Care, Other (non HMO) | Attending: Family

## 2019-03-08 ENCOUNTER — Other Ambulatory Visit: Payer: Self-pay

## 2019-03-08 ENCOUNTER — Other Ambulatory Visit: Admission: RE | Admit: 2019-03-08 | Payer: Managed Care, Other (non HMO) | Source: Ambulatory Visit

## 2019-03-08 DIAGNOSIS — M79601 Pain in right arm: Secondary | ICD-10-CM

## 2019-03-08 MED ORDER — MELOXICAM 7.5 MG PO TABS: 7.5000 mg | ORAL_TABLET | Freq: Every day | ORAL | 0 refills | Status: DC | PRN

## 2019-03-11 ENCOUNTER — Encounter: Admission: RE | Payer: Self-pay | Source: Home / Self Care

## 2019-03-11 ENCOUNTER — Ambulatory Visit
Admission: RE | Admit: 2019-03-11 | Payer: Managed Care, Other (non HMO) | Source: Home / Self Care | Admitting: Gastroenterology

## 2019-03-11 SURGERY — COLONOSCOPY WITH PROPOFOL
Anesthesia: General

## 2019-03-19 ENCOUNTER — Other Ambulatory Visit: Payer: Self-pay | Admitting: Lab

## 2019-03-19 DIAGNOSIS — J45909 Unspecified asthma, uncomplicated: Secondary | ICD-10-CM

## 2019-03-19 DIAGNOSIS — J309 Allergic rhinitis, unspecified: Secondary | ICD-10-CM

## 2019-03-19 MED ORDER — MONTELUKAST SODIUM 10 MG PO TABS: 10.0000 mg | ORAL_TABLET | ORAL | 3 refills | Status: DC

## 2019-03-25 ENCOUNTER — Encounter: Payer: Self-pay | Admitting: Family

## 2019-04-20 ENCOUNTER — Encounter: Payer: Self-pay | Admitting: Family

## 2019-05-11 ENCOUNTER — Encounter: Payer: Self-pay | Admitting: Family

## 2019-05-12 ENCOUNTER — Telehealth: Payer: Self-pay

## 2019-05-12 NOTE — Telephone Encounter (Signed)
I called patient after reading correspondence from [employer]. I was able to schedule her for next available virtual 11/23 @ 9:30 with Dr. Derrel Nip.

## 2019-05-17 ENCOUNTER — Encounter: Payer: Self-pay | Admitting: Internal Medicine

## 2019-05-17 ENCOUNTER — Ambulatory Visit (INDEPENDENT_AMBULATORY_CARE_PROVIDER_SITE_OTHER): Payer: Managed Care, Other (non HMO) | Admitting: Internal Medicine

## 2019-05-17 ENCOUNTER — Other Ambulatory Visit: Payer: Self-pay

## 2019-05-17 VITALS — Ht 66.0 in | Wt 175.0 lb

## 2019-05-17 DIAGNOSIS — N6310 Unspecified lump in the right breast, unspecified quadrant: Secondary | ICD-10-CM | POA: Diagnosis not present

## 2019-05-17 NOTE — Progress Notes (Signed)
Pt stated that the right breast tenderness and nipple discharge has gotten better.

## 2019-05-17 NOTE — Assessment & Plan Note (Addendum)
No history consistent with  Mastitis;  Lack of  Exam,  limited by virtual office visit. Diagnostic breast mammogram and ultrasound ordered.  Last mammogram 2018

## 2019-05-17 NOTE — Progress Notes (Signed)
Virtual Visit via [email or web address]  This visit type was conducted due to national recommendations for restrictions regarding the COVID-19 pandemic (e.g. social distancing).  This format is felt to be most appropriate for this patient at this time.  All issues noted in this document were discussed and addressed.  No physical exam was performed (except for noted visual exam findings with Video Visits).   I connected with@ on 05/17/19 at  9:30 AM EST by a video enabled telemedicine application  and verified that I am speaking with the correct person using two identifiers. Location patient: home Location provider: work or home office Persons participating in the virtual visit: patient, provider  I discussed the limitations, risks, security and privacy concerns of performing an evaluation and management service by telephone and the availability of in person appointments. I also discussed with the patient that there may be a patient responsible charge related to this service. The patient expressed understanding and agreed to proceed.  Reason for visit: right  breast lump/tenderness HPI:   53 yr old perimenopausal female with no history of breast biopsy presents with 2 week history of breast tenderness.  Denies warmth or redness , no nipple discharge.  No history of trauma.  No recent head/nck infection.  Tenderness is improving,  But she does feel a pea sized lump at the 7:00 position in the right breast <1 cm from the nipple.  Last mammogram was  Sept 2018   ROS: See pertinent positives and negatives per HPI.  Past Medical History:  Diagnosis Date  . Asthma    childhood  . Hypertension   . UTI (lower urinary tract infection)     Past Surgical History:  Procedure Laterality Date  . ABDOMINAL HYSTERECTOMY     partial, Dr. Vernie Ammons  . VAGINAL DELIVERY     2    Family History  Problem Relation Age of Onset  . Hypertension Mother   . Cancer Maternal Grandfather        bone  . Asthma Maternal  Grandmother   . Brain cancer Maternal Grandmother   . Asthma Son     SOCIAL HX:  reports that she has been smoking cigarettes. She started smoking about 3 years ago. She has a 10.00 pack-year smoking history. She has never used smokeless tobacco. She reports current alcohol use. She reports that she does not use drugs.   Current Outpatient Medications:  .  albuterol (PROVENTIL HFA;VENTOLIN HFA) 108 (90 Base) MCG/ACT inhaler, Inhale 2 puffs into the lungs every 6 (six) hours as needed for wheezing or shortness of breath., Disp: 1 Inhaler, Rfl: 1 .  ALPRAZolam (XANAX) 0.25 MG tablet, Take 1 tablet (0.25 mg total) by mouth daily as needed for anxiety or sleep., Disp: 30 tablet, Rfl: 1 .  budesonide-formoterol (SYMBICORT) 80-4.5 MCG/ACT inhaler, Inhale 2 puffs into the lungs 2 (two) times daily., Disp: 1 Inhaler, Rfl: 2 .  meloxicam (MOBIC) 7.5 MG tablet, Take 1 tablet (7.5 mg total) by mouth daily as needed for pain., Disp: 90 tablet, Rfl: 0 .  montelukast (SINGULAIR) 10 MG tablet, Take 1 tablet (10 mg total) by mouth 1 day or 1 dose for 1 dose., Disp: 90 tablet, Rfl: 3 .  triamterene-hydrochlorothiazide (DYAZIDE) 37.5-25 MG capsule, TAKE 1 CAPSULE BY MOUTH EVERY DAY, Disp: 90 capsule, Rfl: 1 .  sertraline (ZOLOFT) 50 MG tablet, Take 1 tablet (50 mg total) by mouth at bedtime. (Patient not taking: Reported on 05/17/2019), Disp: 90 tablet, Rfl: 3  EXAM:  VITALS  per patient if applicable:  GENERAL: alert, oriented, appears well and in no acute distress  HEENT: atraumatic, conjunttiva clear, no obvious abnormalities on inspection of external nose and ears  NECK: normal movements of the head and neck  LUNGS: on inspection no signs of respiratory distress, breathing rate appears normal, no obvious gross SOB, gasping or wheezing  CV: no obvious cyanosis  MS: moves all visible extremities without noticeable abnormality  PSYCH/NEURO: pleasant and cooperative, no obvious depression or anxiety,  speech and thought processing grossly intact  ASSESSMENT AND PLAN:  Discussed the following assessment and plan:  Breast mass, right - Plan: MM Digital Diagnostic Bilat, US BREAST COMPLETE UNI RIGHT INC AXILLA, CANCELED: MM Digital Diagnostic Bilat, CANCELED: US BREAST COMPLETE UNI RIGHT INC AXILLA  Breast mass, right No history consistent with  Mastitis;  Lack of  Exam,  limited by virtual office visit. Diagnostic breast mammogram and ultrasound ordered.  Last mammogram 2018     I discussed the assessment and treatment plan with the patient. The patient was provided an opportunity to ask questions and all were answered. The patient agreed with the plan and demonstrated an understanding of the instructions.   The patient was advised to call back or seek an in-person evaluation if the symptoms worsen or if the condition fails to improve as anticipated.    I provided  25 minutes of non-face-to-face time during this encounter reviewing patient's current problems and post surgeries.  Providing counseling on the above mentioned problems , and coordination  of care .  Sherlene Shams, MD

## 2019-05-18 ENCOUNTER — Telehealth: Payer: Self-pay

## 2019-05-18 DIAGNOSIS — N6310 Unspecified lump in the right breast, unspecified quadrant: Secondary | ICD-10-CM

## 2019-05-18 NOTE — Telephone Encounter (Signed)
Orders have been corrected.  

## 2019-05-25 ENCOUNTER — Ambulatory Visit
Admission: RE | Admit: 2019-05-25 | Discharge: 2019-05-25 | Disposition: A | Discharge status: Home / Self Care | Payer: Managed Care, Other (non HMO) | Source: Ambulatory Visit | Attending: Internal Medicine | Admitting: Internal Medicine

## 2019-05-25 DIAGNOSIS — N6310 Unspecified lump in the right breast, unspecified quadrant: Secondary | ICD-10-CM

## 2019-06-04 ENCOUNTER — Other Ambulatory Visit: Payer: Self-pay

## 2019-06-04 DIAGNOSIS — M79601 Pain in right arm: Secondary | ICD-10-CM

## 2019-06-04 DIAGNOSIS — I10 Essential (primary) hypertension: Secondary | ICD-10-CM

## 2019-06-04 MED ORDER — TRIAMTERENE-HCTZ 37.5-25 MG PO CAPS: ORAL_CAPSULE | ORAL | 1 refills | Status: DC

## 2019-06-04 MED ORDER — MELOXICAM 7.5 MG PO TABS: 7.5000 mg | ORAL_TABLET | Freq: Every day | ORAL | 0 refills | Status: DC | PRN

## 2019-06-07 ENCOUNTER — Ambulatory Visit: Payer: Managed Care, Other (non HMO) | Admitting: Family

## 2019-09-20 ENCOUNTER — Ambulatory Visit: Payer: Managed Care, Other (non HMO) | Attending: Internal Medicine

## 2019-09-20 ENCOUNTER — Other Ambulatory Visit: Payer: Self-pay

## 2019-09-20 DIAGNOSIS — Z23 Encounter for immunization: Secondary | ICD-10-CM

## 2019-09-20 NOTE — Progress Notes (Signed)
   Covid-19 Vaccination Clinic  Name:  Jaclyn Lin    MRN: [id number] DOB: 1965-10-27  09/20/2019  [name] was observed post Covid-19 immunization for 15 minutes without incident. She was provided with Vaccine Information Sheet and instruction to access the V-Safe system.   [name] was instructed to call 911 with any severe reactions post vaccine: Marland Kitchen Difficulty breathing  . Swelling of face and throat  . A fast heartbeat  . A bad rash all over body  . Dizziness and weakness   Immunizations Administered    Name Date Dose VIS Date Route   Pfizer COVID-19 Vaccine 09/20/2019 11:20 AM 0.3 mL 06/04/2019 Intramuscular   Manufacturer: [employer], [employer]   Lot: [id number]   NDC: [address]-3

## 2019-10-06 ENCOUNTER — Other Ambulatory Visit: Payer: Self-pay | Admitting: Family

## 2019-10-06 DIAGNOSIS — M79601 Pain in right arm: Secondary | ICD-10-CM

## 2019-10-06 DIAGNOSIS — I10 Essential (primary) hypertension: Secondary | ICD-10-CM

## 2019-10-06 DIAGNOSIS — F419 Anxiety disorder, unspecified: Secondary | ICD-10-CM

## 2019-10-13 ENCOUNTER — Ambulatory Visit: Payer: Managed Care, Other (non HMO) | Attending: Internal Medicine

## 2019-10-13 DIAGNOSIS — Z23 Encounter for immunization: Secondary | ICD-10-CM

## 2019-10-13 NOTE — Progress Notes (Signed)
   Covid-19 Vaccination Clinic  Name:  Jaclyn Lin    MRN: [id number] DOB: 12/02/1965  10/13/2019  Jaclyn Lin was observed post Covid-19 immunization for 15 minutes without incident. She was provided with Vaccine Information Sheet and instruction to access the V-Safe system.   Jaclyn Lin was instructed to call 911 with any severe reactions post vaccine: Marland Kitchen Difficulty breathing  . Swelling of face and throat  . A fast heartbeat  . A bad rash all over body  . Dizziness and weakness   Immunizations Administered    Name Date Dose VIS Date Route   Pfizer COVID-19 Vaccine 10/13/2019  3:07 PM 0.3 mL 08/18/2018 Intramuscular   Manufacturer: [employer], [employer]   Lot: [id number]   NDC: [address]-4

## 2019-11-11 ENCOUNTER — Encounter: Payer: Self-pay | Admitting: Emergency Medicine

## 2019-11-11 ENCOUNTER — Other Ambulatory Visit: Payer: Self-pay

## 2019-11-11 ENCOUNTER — Ambulatory Visit
Admission: EM | Admit: 2019-11-11 | Discharge: 2019-11-11 | Disposition: A | Discharge status: Home / Self Care | Payer: Managed Care, Other (non HMO) | Attending: Family Medicine | Admitting: Family Medicine

## 2019-11-11 ENCOUNTER — Ambulatory Visit (INDEPENDENT_AMBULATORY_CARE_PROVIDER_SITE_OTHER): Payer: Managed Care, Other (non HMO)

## 2019-11-11 DIAGNOSIS — S62626A Displaced fracture of medial phalanx of right little finger, initial encounter for closed fracture: Secondary | ICD-10-CM

## 2019-11-11 DIAGNOSIS — M79601 Pain in right arm: Secondary | ICD-10-CM

## 2019-11-11 MED ORDER — TRAMADOL HCL 50 MG PO TABS: 50.0000 mg | ORAL_TABLET | Freq: Three times a day (TID) | ORAL | 0 refills | Status: DC | PRN

## 2019-11-11 NOTE — ED Provider Notes (Signed)
[facility]    CSN: [id number] Arrival date & time: 11/11/19  1630  History   Chief Complaint Chief Complaint  Patient presents with  . Fall  . Hand Pain    right pinky   HPI  54 year old female presents with the above complaints.  Patient states that she was chasing one of her grandchildren around and suffered a fall.  She believes that she fell on an outstretched right hand.  She states that her fall was last Friday.  She injured her right hand and most notably the right fifth digit.  She has been icing and using ibuprofen without resolution.  She has also buddy taped her finger and used a brace.  She is concerned that she has a fracture.  Rates her pain a 7/10 in severity.  No other associated symptoms.  No other complaints.  Past Medical History:  Diagnosis Date  . Asthma    childhood  . Hypertension   . UTI (lower urinary tract infection)     Patient Active Problem List   Diagnosis Date Noted  . Breast mass, right 05/17/2019  . Suprapubic pressure 02/18/2019  . Left knee pain 02/17/2019  . Right arm pain 09/07/2018  . Screening for breast cancer 09/07/2018  . Shortness of breath 03/09/2018  . Uncomplicated asthma 01/23/2018  . Soft tissue mass 01/23/2018  . Gastroesophageal reflux disease 02/10/2017  . BPPV (benign paroxysmal positional vertigo) 09/11/2015  . Chronic fatigue 01/27/2015  . Routine general medical examination at a health care facility 07/21/2014  . Prediabetes 12/17/2013  . Anxiety 04/29/2012  . Hypertension 05/24/2011    Past Surgical History:  Procedure Laterality Date  . ABDOMINAL HYSTERECTOMY     partial, Dr. Haskel Khan  . VAGINAL DELIVERY     2    OB History   No obstetric history on file.      Home Medications    Prior to Admission medications   Medication Sig Start Date End Date Taking? Authorizing Provider  albuterol (PROVENTIL HFA;VENTOLIN HFA) 108 (90 Base) MCG/ACT inhaler Inhale 2 puffs into the lungs every 6  (six) hours as needed for wheezing or shortness of breath. 08/20/18  Yes Guse, Janna Arch, FNP  ALPRAZolam Prudy Feeler) 0.25 MG tablet Take 1 tablet (0.25 mg total) by mouth daily as needed for anxiety or sleep. 02/17/19  Yes Allegra Grana, FNP  budesonide-formoterol (SYMBICORT) 80-4.5 MCG/ACT inhaler Inhale 2 puffs into the lungs 2 (two) times daily. 08/20/18  Yes Guse, Janna Arch, FNP  meloxicam (MOBIC) 7.5 MG tablet TAKE 1 TABLET(7.5 MG) BY MOUTH DAILY AS NEEDED FOR PAIN 10/06/19  Yes Arnett, Lyn Records, FNP  montelukast (SINGULAIR) 10 MG tablet Take 1 tablet (10 mg total) by mouth 1 day or 1 dose for 1 dose. 03/19/19 11/11/19 Yes Guse, Janna Arch, FNP  traMADol (ULTRAM) 50 MG tablet Take 1 tablet (50 mg total) by mouth every 8 (eight) hours as needed. 11/11/19   Tommie Sams, DO  triamterene-hydrochlorothiazide (DYAZIDE) 37.5-25 MG capsule TAKE 1 CAPSULE BY MOUTH EVERY DAY 10/06/19   Allegra Grana, FNP  sertraline (ZOLOFT) 50 MG tablet TAKE 1 TABLET(50 MG) BY MOUTH AT BEDTIME 10/06/19 11/11/19  Allegra Grana, FNP    Family History Family History  Problem Relation Age of Onset  . Hypertension Mother   . Cancer Maternal Grandfather        bone  . Asthma Maternal Grandmother   . Brain cancer Maternal Grandmother   . Asthma Son  Social History Social History   Tobacco Use  . Smoking status: Current Every Day Smoker    Packs/day: 0.50    Years: 20.00    Pack years: 10.00    Types: Cigarettes    Start date: 02/11/2016  . Smokeless tobacco: Never Used  . Tobacco comment: quit in Jan 2019 but has resumed   Substance Use Topics  . Alcohol use: Yes    Comment: Beer on weekends; 6 pack  . Drug use: No     Allergies   Codeine   Review of Systems Review of Systems  Constitutional: Negative.   Musculoskeletal:       Right hand injury.   Physical Exam Triage Vital Signs ED Triage Vitals  Enc Vitals Group     BP 11/11/19 1641 136/89     Pulse Rate 11/11/19 1641 72     Resp  11/11/19 1641 18     Temp 11/11/19 1641 98.5 F (36.9 C)     Temp Source 11/11/19 1641 Oral     SpO2 11/11/19 1641 97 %     Weight 11/11/19 1640 175 lb 0.7 oz (79.4 kg)     Height 11/11/19 1640 5\' 6"  (1.676 m)     Head Circumference --      Peak Flow --      Pain Score 11/11/19 1640 7     Pain Loc --      Pain Edu? --      Excl. in GC? --    Updated Vital Signs BP 136/89 (BP Location: Left Arm)   Pulse 72   Temp 98.5 F (36.9 C) (Oral)   Resp 18   Ht 5\' 6"  (1.676 m)   Wt 79.4 kg   SpO2 97%   BMI 28.25 kg/m   Visual Acuity Right Eye Distance:   Left Eye Distance:   Bilateral Distance:    Right Eye Near:   Left Eye Near:    Bilateral Near:     Physical Exam Vitals and nursing note reviewed.  Constitutional:      General: She is not in acute distress.    Appearance: Normal appearance. She is not ill-appearing.  HENT:     Head: Normocephalic and atraumatic.  Eyes:     General:        Right eye: No discharge.        Left eye: No discharge.     Conjunctiva/sclera: Conjunctivae normal.  Pulmonary:     Effort: Pulmonary effort is normal. No respiratory distress.  Musculoskeletal:     Comments: Right hand -visible swelling of the right fifth digit noted.  Patient has diffuse tenderness over the right fifth digit as well as the lateral hand.  Patient also has tenderness to palpation over the distal radius.  Neurological:     Mental Status: She is alert.  Psychiatric:        Mood and Affect: Mood normal.        Behavior: Behavior normal.    UC Treatments / Results  Labs (all labs ordered are listed, but only abnormal results are displayed) Labs Reviewed - No data to display  EKG   Radiology DG Wrist Complete Right  Result Date: 11/11/2019 CLINICAL DATA:  Right hand and wrist pain after fall 1 week ago. Swelling. EXAM: RIGHT WRIST - COMPLETE 3+ VIEW COMPARISON:  None. FINDINGS: There is no evidence of fracture or dislocation. Normal alignment and joint spaces.  Few degenerative carpal bone cysts. Slight spurring of the  radial styloid. Soft tissues are unremarkable. IMPRESSION: No fracture or subluxation of the right wrist. Electronically Signed   By: Keith Rake M.D.   On: 11/11/2019 17:11   DG Hand Complete Right  Result Date: 11/11/2019 CLINICAL DATA:  Post fall with right hand and wrist pain and swelling. Fall 1 week ago. Patient reports pinky finger pain. EXAM: RIGHT HAND - COMPLETE 3+ VIEW COMPARISON:  None. FINDINGS: Volar plate fracture of the fifth digit middle phalanx, likely subacute as margins are starting to appears sclerotic. 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 No additional fracture of the hand. Alignment and joint spaces are maintained. Mild soft tissue edema of the fifth digit. IMPRESSION: 1. Volar plate fracture of the fifth digit middle phalanx, likely subacute. 2. No additional fracture of the hand. Electronically Signed   By: Keith Rake M.D.   On: 11/11/2019 17:10    Procedures Procedures (including critical care time)  Medications Ordered in UC Medications - No data to display  Initial Impression / Assessment and Plan / UC Course  I have reviewed the triage vital signs and the nursing notes.  Pertinent labs & imaging results that were available during my care of the patient were reviewed by me and considered in my medical decision making (see chart for details).    54 year old female presents with a finger fracture.  X-rays obtained today and independently reviewed by me.  Interpretation: No acute findings of the wrist.  Hand x-ray revealed fracture of the proximal portion/volar plate of the fifth middle phalanx.  Patient was placed in a splint.  Tramadol as needed for pain.  Advise follow-up with orthopedics.  Supportive care.  Final Clinical Impressions(s) / UC Diagnoses   Final diagnoses:  Displaced fracture of middle phalanx of right little finger, initial encounter for closed fracture       Discharge Instructions     Wear splint.   Pain medication as needed.  Please call [facility] [phone]) OR [facility] [phone]) for an appt.  Take care  Dr. Lacinda Axon     ED Prescriptions    Medication Sig Dispense Auth. Provider   traMADol (ULTRAM) 50 MG tablet Take 1 tablet (50 mg total) by mouth every 8 (eight) hours as needed. 15 tablet Thersa Salt G, DO     I have reviewed the PDMP during this encounter.   Coral Spikes, [place] 11/11/19 1725

## 2019-11-11 NOTE — Discharge Instructions (Signed)
Wear splint.   Pain medication as needed.  Please call [facility] Orthoped[facility] [phone]) OR [facility] [phone]) for an appt.  Take care  Dr. Adriana Simas

## 2019-11-11 NOTE — ED Triage Notes (Signed)
Pt c/o right pinky finger pain. She state she fell about a week ago. She has swelling and mild bruising. She had her fingers buddy taped and has a brace.

## 2019-12-28 ENCOUNTER — Ambulatory Visit: Payer: Managed Care, Other (non HMO) | Attending: Student | Admitting: Occupational Therapy

## 2020-01-04 ENCOUNTER — Other Ambulatory Visit: Payer: Self-pay | Admitting: Family

## 2020-01-04 DIAGNOSIS — M79601 Pain in right arm: Secondary | ICD-10-CM

## 2020-02-11 ENCOUNTER — Ambulatory Visit (INDEPENDENT_AMBULATORY_CARE_PROVIDER_SITE_OTHER): Payer: Managed Care, Other (non HMO) | Admitting: Family

## 2020-02-11 ENCOUNTER — Encounter: Payer: Self-pay | Admitting: Family

## 2020-02-11 ENCOUNTER — Other Ambulatory Visit: Payer: Self-pay

## 2020-02-11 VITALS — BP 132/88 | HR 73 | Temp 98.0°F | Ht 65.98 in | Wt 180.6 lb

## 2020-02-11 DIAGNOSIS — M2550 Pain in unspecified joint: Secondary | ICD-10-CM | POA: Diagnosis not present

## 2020-02-11 DIAGNOSIS — Z23 Encounter for immunization: Secondary | ICD-10-CM | POA: Diagnosis not present

## 2020-02-11 DIAGNOSIS — Z0000 Encounter for general adult medical examination without abnormal findings: Secondary | ICD-10-CM

## 2020-02-11 DIAGNOSIS — M7989 Other specified soft tissue disorders: Secondary | ICD-10-CM | POA: Diagnosis not present

## 2020-02-11 DIAGNOSIS — F419 Anxiety disorder, unspecified: Secondary | ICD-10-CM | POA: Diagnosis not present

## 2020-02-11 DIAGNOSIS — I10 Essential (primary) hypertension: Secondary | ICD-10-CM

## 2020-02-11 DIAGNOSIS — Z0001 Encounter for general adult medical examination with abnormal findings: Secondary | ICD-10-CM | POA: Diagnosis not present

## 2020-02-11 DIAGNOSIS — J45909 Unspecified asthma, uncomplicated: Secondary | ICD-10-CM

## 2020-02-11 MED ORDER — SERTRALINE HCL 50 MG PO TABS: 50.0000 mg | ORAL_TABLET | Freq: Every day | ORAL | 3 refills | Status: DC

## 2020-02-11 MED ORDER — DICLOFENAC SODIUM 1 % EX GEL: 4.0000 g | Freq: Four times a day (QID) | CUTANEOUS | 1 refills | Status: DC

## 2020-02-11 NOTE — Assessment & Plan Note (Signed)
Uncontrolled. Advised to restart zoloft. May increase to 100mg  at follow up.

## 2020-02-11 NOTE — Progress Notes (Signed)
Subjective:    Patient ID: Jaclyn Lin, female    DOB: [birth year], 54 y.o.   MRN: [id number]  CC: Jaclyn Lin is a 54 y.o. female who presents today for physical exam.    HPI: Complains of left ventral foot mass, tender. It has been present for nearly a year. She thinks grown in size. Painful to walk on . She now noticed a smaller one right ventral foot.    She also complains of pain in bilateral 'knuckles' when wakes up in the morning.  No swelling, redness, increased warmth, numbness.   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   No h/o gout. No gi bleed. Has not been taking mobic.   No family h/o rheumatoid arthritis. Takes aleve without relief.   GAD- trouble sleeping and worries about her son. Stopped zoloft. Hasnt used xanax all year.  No si/hi.  Asthma- no sob, cough, wheezing. Taking singulair daily without need for either albuterol, symbicort   HTN- compliant with dyazide   Colorectal Cancer Screening: due Breast Cancer Screening: UTD; due 05/2020 Cervical Cancer Screening: due; patient would like to be screened for STDs. No concerns.  pap performed 2016 Dr Dan Humphreys. Reviewed note and no mention of no cervix.  H/o hysterectomy  Lung Cancer Screening: meets criteria to start 06/24/2020 at 54 yrs old       Tetanus - utd        Pneumococcal - Candidate for.  Hepatitis C screening - Candidate for, consents  Labs: Screening labs today. Exercise: Gets regular exercise.  Alcohol use: beer on weekends Smoking/tobacco use: smoker.   HISTORY:  Past Medical History:  Diagnosis Date  . Asthma    childhood  . Hypertension   . UTI (lower urinary tract infection)     Past Surgical History:  Procedure Laterality Date  . ABDOMINAL HYSTERECTOMY     partial, Dr. 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 02/11/20 No cervix on exam today. Will repeat pap next year as prior PCP.   Marland Kitchen VAGINAL DELIVERY     2   Family  History  Problem Relation Age of Onset  . Hypertension Mother   . Cancer Maternal Grandfather        bone  . Asthma Maternal Grandmother   . Brain cancer Maternal Grandmother   . Asthma Son       ALLERGIES: Codeine  Current Outpatient Medications on File Prior to Visit  Medication Sig Dispense Refill  . albuterol (PROVENTIL HFA;VENTOLIN HFA) 108 (90 Base) MCG/ACT inhaler Inhale 2 puffs into the lungs every 6 (six) hours as needed for wheezing or shortness of breath. 1 Inhaler 1  . ALPRAZolam (XANAX) 0.25 MG tablet Take 1 tablet (0.25 mg total) by mouth daily as needed for anxiety or sleep. 30 tablet 1  . benzonatate (TESSALON) 200 MG capsule SMARTSIG:1 Capsule(s) By Mouth 2-3 Times Daily PRN    . budesonide-formoterol (SYMBICORT) 80-4.5 MCG/ACT inhaler Inhale 2 puffs into the lungs 2 (two) times daily. 1 Inhaler 2  . meloxicam (MOBIC) 7.5 MG tablet TAKE 1 TABLET(7.5 MG) BY MOUTH DAILY AS NEEDED FOR PAIN 90 tablet 0  . triamterene-hydrochlorothiazide (DYAZIDE) 37.5-25 MG capsule TAKE 1 CAPSULE BY MOUTH EVERY DAY 90 capsule 1  . montelukast (SINGULAIR) 10 MG tablet Take 1 tablet (10 mg total) by mouth 1 day or 1 dose for 1 dose. 90 tablet 3   No current facility-administered medications on file prior to visit.  Social History   Tobacco Use  . Smoking status: Current Every Day Smoker    Packs/day: 0.50    Years: 20.00    Pack years: 10.00    Types: Cigarettes    Start date: 02/11/2016  . Smokeless tobacco: Never Used  . Tobacco comment: smoking every couple of days  Vaping Use  . Vaping Use: Never used  Substance Use Topics  . Alcohol use: Yes    Comment: Beer on weekends; 6 pack  . Drug use: No    Review of Systems  Constitutional: Negative for chills, fever and unexpected weight change.  HENT: Negative for congestion.   Respiratory: Negative for cough and shortness of breath.   Cardiovascular: Negative for chest pain, palpitations and leg swelling.  Gastrointestinal:  Negative for nausea and vomiting.  Musculoskeletal: Positive for arthralgias. Negative for myalgias.  Skin: Negative for rash.  Neurological: Negative for headaches.  Hematological: Negative for adenopathy.  Psychiatric/Behavioral: Positive for sleep disturbance. Negative for confusion and suicidal ideas. The patient is nervous/anxious.       Objective:    BP 132/88 (BP Location: Left Arm, Patient Position: Sitting)   Pulse 73   Temp 98 F (36.7 C)   Ht 5' 5.98" (1.676 m)   Wt 180 lb 9.6 oz (81.9 kg)   SpO2 99%   BMI 29.16 kg/m   BP Readings from Last 3 Encounters:  02/11/20 132/88  11/11/19 136/89  02/22/19 122/76   Wt Readings from Last 3 Encounters:  02/11/20 180 lb 9.6 oz (81.9 kg)  11/11/19 175 lb 0.7 oz (79.4 kg)  05/17/19 175 lb (79.4 kg)    Physical Exam Vitals reviewed.  Constitutional:      Appearance: She is well-developed.  Eyes:     Conjunctiva/sclera: Conjunctivae normal.  Neck:     Thyroid: No thyroid mass or thyromegaly.  Cardiovascular:     Rate and Rhythm: Normal rate and regular rhythm.     Pulses: Normal pulses.     Heart sounds: Normal heart sounds.  Pulmonary:     Effort: Pulmonary effort is normal.     Breath sounds: Normal breath sounds. No wheezing, rhonchi or rales.  Chest:     Breasts: Breasts are symmetrical.        Right: No inverted nipple, mass, nipple discharge, skin change or tenderness.        Left: No inverted nipple, mass, nipple discharge, skin change or tenderness.  Genitourinary:    Cervix: No discharge.     Adnexa:        Right: No mass, tenderness or fullness.         Left: No mass, tenderness or fullness.       Comments: No cervix appreciated on exam today. Pap collected of vaginal wall. No Unable to appreciate ovaries. Musculoskeletal:     Right hand: Normal. No swelling, deformity or bony tenderness. Normal strength. Normal sensation.     Left hand: Normal. No swelling, deformity or bony tenderness. Normal strength.  Normal sensation.       Feet:  Feet:     Comments: Tender , palpable masses as marked on diagram. Non fluctuant. No erythema.  Lymphadenopathy:     Head:     Right side of head: No submental, submandibular, tonsillar, preauricular, posterior auricular or occipital adenopathy.     Left side of head: No submental, submandibular, tonsillar, preauricular, posterior auricular or occipital adenopathy.     Cervical:     Right cervical: No superficial,  deep or posterior cervical adenopathy.    Left cervical: No superficial, deep or posterior cervical adenopathy.     Upper Body:     Right upper body: No pectoral adenopathy.     Left upper body: No pectoral adenopathy.  Skin:    General: Skin is warm and dry.  Neurological:     Mental Status: She is alert.  Psychiatric:        Speech: Speech normal.        Behavior: Behavior normal.        Thought Content: Thought content normal.        Assessment & Plan:   Problem List Items Addressed This Visit      Cardiovascular and Mediastinum   Hypertension    Slightly elevated today. Will follow and make changes to antihypertensive regimen if persistent.         Respiratory   Uncomplicated asthma    Stable, well controlled. She will continue singulair with rare use of inhalers.         Other   Anxiety    Uncontrolled. Advised to restart zoloft. May increase to 100mg  at follow up.      Relevant Medications   sertraline (ZOLOFT) 50 MG tablet   Arthralgia    Benign exam. Pending lab evaluation. Advised to start mobic prn and voltaren gel. Close follow up      Relevant Medications   diclofenac Sodium (VOLTAREN) 1 % GEL   Other Relevant Orders   ANA   C-reactive protein   CYCLIC CITRUL PEPTIDE ANTIBODY, IGG/IGA   Rheumatoid factor   Sedimentation rate   Uric acid   Routine general medical examination at a health care facility - Primary    CBE exam performed. mammogram utd. No cervix on exam and after reviewing chart in detail,  I returned to room and explained that I would like to repeat pelvic exam in 6 months to ensure no cervix identified prior to discontinuing pap smear. Patient agreed with all.       Relevant Orders   Cytology - PAP   Pneumococcal polysaccharide vaccine 23-valent greater than or equal to 2yo subcutaneous/IM (Completed)   Pap LB (liquid-based)   ANA   CBC with Differential/Platelet   Comprehensive metabolic panel   C-reactive protein   CYCLIC CITRUL PEPTIDE ANTIBODY, IGG/IGA   Hemoglobin A1c   Hepatitis C antibody   Lipid panel   Rheumatoid factor   TSH   VITAMIN D 25 Hydroxy (Vit-D Deficiency, Fractures)   Soft tissue mass    Presentation c/w lipoma but I have never seen one dorsal aspect of foot. Referral to podiatry for further evaluation, treatment ( ? Excision).      Relevant Orders   Ambulatory referral to Podiatry       I have discontinued Kathrin Pennerngela G. Tufano's traMADol, predniSONE, and ibuprofen. I am also having her start on diclofenac Sodium and sertraline. Additionally, I am having her maintain her albuterol, budesonide-formoterol, ALPRAZolam, montelukast, triamterene-hydrochlorothiazide, meloxicam, and benzonatate.   Meds ordered this encounter  Medications  . diclofenac Sodium (VOLTAREN) 1 % GEL    Sig: Apply 4 g topically 4 (four) times daily.    Dispense:  50 g    Refill:  1    Order Specific Question:   Supervising Provider    Answer:   Darrick HuntsmanULLO, TERESA L [[id number]]  . sertraline (ZOLOFT) 50 MG tablet    Sig: Take 1 tablet (50 mg total) by mouth at bedtime.  Dispense:  90 tablet    Refill:  3    Order Specific Question:   Supervising Provider    Answer:   Sherlene Shams [[id number]]    Return precautions given.   Risks, benefits, and alternatives of the medications and treatment plan prescribed today were discussed, and patient expressed understanding.   Education regarding symptom management and diagnosis given to patient on AVS.   Continue to follow with  Jaclyn Grana, FNP for routine health maintenance.   Jaclyn Lin and I agreed with plan.   Rennie Plowman, FNP

## 2020-02-11 NOTE — Assessment & Plan Note (Signed)
CBE exam performed. mammogram utd. No cervix on exam and after reviewing chart in detail, I returned to room and explained that I would like to repeat pelvic exam in 6 months to ensure no cervix identified prior to discontinuing pap smear. Patient agreed with all.

## 2020-02-11 NOTE — Assessment & Plan Note (Addendum)
Slightly elevated today. Will follow and make changes to antihypertensive regimen if persistent.

## 2020-02-11 NOTE — Assessment & Plan Note (Signed)
Presentation c/w lipoma but I have never seen one dorsal aspect of foot. Referral to podiatry for further evaluation, treatment ( ? Excision).

## 2020-02-11 NOTE — Assessment & Plan Note (Signed)
Stable, well controlled. She will continue singulair with rare use of inhalers.

## 2020-02-11 NOTE — Patient Instructions (Addendum)
As discussed, we will repeat pap smear in 6 months as I did not see cervix today. Prior doctor notes indicated that you have a cervix.   We will repeat to absolutely sure.   Restart zoloft  Restart mobic Take with food.   Referral to podiatry  Let us know if you dont hear back within a week in regards to an appointment being scheduled.   Generalized Anxiety Disorder, Adult Generalized anxiety disorder (GAD) is a mental health disorder. People with this condition constantly worry about everyday events. Unlike normal anxiety, worry related to GAD is not triggered by a specific event. These worries also do not fade or get better with time. GAD interferes with life functions, including relationships, work, and school. GAD can vary from mild to severe. People with severe GAD can have intense waves of anxiety with physical symptoms (panic attacks). What are the causes? The exact cause of GAD is not known. What increases the risk? This condition is more likely to develop in:  Women.  People who have a family history of anxiety disorders.  People who are very shy.  People who experience very stressful life events, such as the death of a loved one.  People who have a very stressful family environment. What are the signs or symptoms? People with GAD often worry excessively about many things in their lives, such as their health and family. They may also be overly concerned about:  Doing well at work.  Being on time.  Natural disasters.  Friendships. Physical symptoms of GAD include:  Fatigue.  Muscle tension or having muscle twitches.  Trembling or feeling shaky.  Being easily startled.  Feeling like your heart is pounding or racing.  Feeling out of breath or like you cannot take a deep breath.  Having trouble falling asleep or staying asleep.  Sweating.  Nausea, diarrhea, or irritable bowel syndrome (IBS).  Headaches.  Trouble concentrating or remembering  facts.  Restlessness.  Irritability. How is this diagnosed? Your health care provider can diagnose GAD based on your symptoms and medical history. You will also have a physical exam. The health care provider will ask specific questions about your symptoms, including how severe they are, when they started, and if they come and go. Your health care provider may ask you about your use of alcohol or drugs, including prescription medicines. Your health care provider may refer you to a mental health specialist for further evaluation. Your health care provider will do a thorough examination and may perform additional tests to rule out other possible causes of your symptoms. To be diagnosed with GAD, a person must have anxiety that:  Is out of his or her control.  Affects several different aspects of his or her life, such as work and relationships.  Causes distress that makes him or her unable to take part in normal activities.  Includes at least three physical symptoms of GAD, such as restlessness, fatigue, trouble concentrating, irritability, muscle tension, or sleep problems. Before your health care provider can confirm a diagnosis of GAD, these symptoms must be present more days than they are not, and they must last for six months or longer. How is this treated? The following therapies are usually used to treat GAD:  Medicine. Antidepressant medicine is usually prescribed for long-term daily control. Antianxiety medicines may be added in severe cases, especially when panic attacks occur.  Talk therapy (psychotherapy). Certain types of talk therapy can be helpful in treating GAD by providing support, education, and  guidance. Options include: ? Cognitive behavioral therapy (CBT). People learn coping skills and techniques to ease their anxiety. They learn to identify unrealistic or negative thoughts and behaviors and to replace them with positive ones. ? Acceptance and commitment therapy (ACT).  This treatment teaches people how to be mindful as a way to cope with unwanted thoughts and feelings. ? Biofeedback. This process trains you to manage your body's response (physiological response) through breathing techniques and relaxation methods. You will work with a therapist while machines are used to monitor your physical symptoms.  Stress management techniques. These include yoga, meditation, and exercise. A mental health specialist can help determine which treatment is best for you. Some people see improvement with one type of therapy. However, other people require a combination of therapies. Follow these instructions at home:  Take over-the-counter and prescription medicines only as told by your health care provider.  Try to maintain a normal routine.  Try to anticipate stressful situations and allow extra time to manage them.  Practice any stress management or self-calming techniques as taught by your health care provider.  Do not punish yourself for setbacks or for not making progress.  Try to recognize your accomplishments, even if they are small.  Keep all follow-up visits as told by your health care provider. This is important. Contact a health care provider if:  Your symptoms do not get better.  Your symptoms get worse.  You have signs of depression, such as: ? A persistently sad, cranky, or irritable mood. ? Loss of enjoyment in activities that used to bring you joy. ? Change in weight or eating. ? Changes in sleeping habits. ? Avoiding friends or family members. ? Loss of energy for normal tasks. ? Feelings of guilt or worthlessness. Get help right away if:  You have serious thoughts about hurting yourself or others. If you ever feel like you may hurt yourself or others, or have thoughts about taking your own life, get help right away. You can go to your nearest emergency department or call:  Your local emergency services (911 in the [place]  A suicide crisis  helpline, such as the National Suicide Prevention Lifeline at (380)558-4448. This is open 24 hours a day. Summary  Generalized anxiety disorder (GAD) is a mental health disorder that involves worry that is not triggered by a specific event.  People with GAD often worry excessively about many things in their lives, such as their health and family.  GAD may cause physical symptoms such as restlessness, trouble concentrating, sleep problems, frequent sweating, nausea, diarrhea, headaches, and trembling or muscle twitching.  A mental health specialist can help determine which treatment is best for you. Some people see improvement with one type of therapy. However, other people require a combination of therapies. This information is not intended to replace advice given to you by your health care provider. Make sure you discuss any questions you have with your health care provider. Document Revised: 05/23/2017 Document Reviewed: 04/30/2016 Elsevier Patient Education  2020 [employer].   Health Maintenance, Female Adopting a healthy lifestyle and getting preventive care are important in promoting health and wellness. Ask your health care provider about:  The right schedule for you to have regular tests and exams.  Things you can do on your own to prevent diseases and keep yourself healthy. What should I know about diet, weight, and exercise? Eat a healthy diet   Eat a diet that includes plenty of vegetables, fruits, low-fat dairy products, and lean  protein.  Do not eat a lot of foods that are high in solid fats, added sugars, or sodium. Maintain a healthy weight Body mass index (BMI) is used to identify weight problems. It estimates body fat based on height and weight. Your health care provider can help determine your BMI and help you achieve or maintain a healthy weight. Get regular exercise Get regular exercise. This is one of the most important things you can do for your health. Most  adults should:  Exercise for at least 150 minutes each week. The exercise should increase your heart rate and make you sweat (moderate-intensity exercise).  Do strengthening exercises at least twice a week. This is in addition to the moderate-intensity exercise.  Spend less time sitting. Even light physical activity can be beneficial. Watch cholesterol and blood lipids Have your blood tested for lipids and cholesterol at 54 years of age, then have this test every 5 years. Have your cholesterol levels checked more often if:  Your lipid or cholesterol levels are high.  You are older than 54 years of age.  You are at high risk for heart disease. What should I know about cancer screening? Depending on your health history and family history, you may need to have cancer screening at various ages. This may include screening for:  Breast cancer.  Cervical cancer.  Colorectal cancer.  Skin cancer.  Lung cancer. What should I know about heart disease, diabetes, and high blood pressure? Blood pressure and heart disease  High blood pressure causes heart disease and increases the risk of stroke. This is more likely to develop in people who have high blood pressure readings, are of African descent, or are overweight.  Have your blood pressure checked: ? Every 3-5 years if you are 93-70 years of age. ? Every year if you are 26 years old or older. Diabetes Have regular diabetes screenings. This checks your fasting blood sugar level. Have the screening done:  Once every three years after age 26 if you are at a normal weight and have a low risk for diabetes.  More often and at a younger age if you are overweight or have a high risk for diabetes. What should I know about preventing infection? Hepatitis B If you have a higher risk for hepatitis B, you should be screened for this virus. Talk with your health care provider to find out if you are at risk for hepatitis B infection. Hepatitis  C Testing is recommended for:  Everyone born from [birth year] through 1965.  Anyone with known risk factors for hepatitis C. Sexually transmitted infections (STIs)  Get screened for STIs, including gonorrhea and chlamydia, if: ? You are sexually active and are younger than 54 years of age. ? You are older than 54 years of age and your health care provider tells you that you are at risk for this type of infection. ? Your sexual activity has changed since you were last screened, and you are at increased risk for chlamydia or gonorrhea. Ask your health care provider if you are at risk.  Ask your health care provider about whether you are at high risk for HIV. Your health care provider may recommend a prescription medicine to help prevent HIV infection. If you choose to take medicine to prevent HIV, you should first get tested for HIV. You should then be tested every 3 months for as long as you are taking the medicine. Pregnancy  If you are about to stop having your period (premenopausal) and  you may become pregnant, seek counseling before you get pregnant.  Take 400 to 800 micrograms (mcg) of folic acid every day if you become pregnant.  Ask for birth control (contraception) if you want to prevent pregnancy. Osteoporosis and menopause Osteoporosis is a disease in which the bones lose minerals and strength with aging. This can result in bone fractures. If you are 22 years old or older, or if you are at risk for osteoporosis and fractures, ask your health care provider if you should:  Be screened for bone loss.  Take a calcium or vitamin D supplement to lower your risk of fractures.  Be given hormone replacement therapy (HRT) to treat symptoms of menopause. Follow these instructions at home: Lifestyle  Do not use any products that contain nicotine or tobacco, such as cigarettes, e-cigarettes, and chewing tobacco. If you need help quitting, ask your health care provider.  Do not use street  drugs.  Do not share needles.  Ask your health care provider for help if you need support or information about quitting drugs. Alcohol use  Do not drink alcohol if: ? Your health care provider tells you not to drink. ? You are pregnant, may be pregnant, or are planning to become pregnant.  If you drink alcohol: ? Limit how much you use to 0-1 drink a day. ? Limit intake if you are breastfeeding.  Be aware of how much alcohol is in your drink. In the [place] one drink equals one 12 oz bottle of beer (355 mL), one 5 oz glass of wine (148 mL), or one 1 oz glass of hard liquor (44 mL). General instructions  Schedule regular health, dental, and eye exams.  Stay current with your vaccines.  Tell your health care provider if: ? You often feel depressed. ? You have ever been abused or do not feel safe at home. Summary  Adopting a healthy lifestyle and getting preventive care are important in promoting health and wellness.  Follow your health care provider's instructions about healthy diet, exercising, and getting tested or screened for diseases.  Follow your health care provider's instructions on monitoring your cholesterol and blood pressure. This information is not intended to replace advice given to you by your health care provider. Make sure you discuss any questions you have with your health care provider. Document Revised: 06/03/2018 Document Reviewed: 06/03/2018 Elsevier Patient Education  2020 [employer]

## 2020-02-11 NOTE — Assessment & Plan Note (Signed)
Benign exam. Pending lab evaluation. Advised to start mobic prn and voltaren gel. Close follow up

## 2020-02-14 LAB — CBC WITH DIFFERENTIAL/PLATELET
Basophils Absolute: 0.1 10*3/uL (ref 0.0–0.2)
Basos: 1 %
EOS (ABSOLUTE): 0.1 10*3/uL (ref 0.0–0.4)
Eos: 2 %
Hematocrit: 39.1 % (ref 34.0–46.6)
Hemoglobin: 12.9 g/dL (ref 11.1–15.9)
Immature Grans (Abs): 0 10*3/uL (ref 0.0–0.1)
Immature Granulocytes: 0 %
Lymphocytes Absolute: 2.4 10*3/uL (ref 0.7–3.1)
Lymphs: 36 %
MCH: 27.4 pg (ref 26.6–33.0)
MCHC: 33 g/dL (ref 31.5–35.7)
MCV: 83 fL (ref 79–97)
Monocytes Absolute: 0.5 10*3/uL (ref 0.1–0.9)
Monocytes: 8 %
Neutrophils Absolute: 3.6 10*3/uL (ref 1.4–7.0)
Neutrophils: 53 %
Platelets: 282 10*3/uL (ref 150–450)
RBC: 4.71 x10E6/uL (ref 3.77–5.28)
RDW: 13.1 % (ref 11.7–15.4)
WBC: 6.6 10*3/uL (ref 3.4–10.8)

## 2020-02-14 LAB — COMPREHENSIVE METABOLIC PANEL
ALT: 14 IU/L (ref 0–32)
AST: 19 IU/L (ref 0–40)
Albumin/Globulin Ratio: 1.7 (ref 1.2–2.2)
Albumin: 4.5 g/dL (ref 3.8–4.9)
Alkaline Phosphatase: 65 IU/L (ref 48–121)
BUN/Creatinine Ratio: 16 (ref 9–23)
BUN: 12 mg/dL (ref 6–24)
Bilirubin Total: <0.2 mg/dL (ref 0.0–1.2)
CO2: 25 mmol/L (ref 20–29)
Calcium: 9.9 mg/dL (ref 8.7–10.2)
Chloride: 100 mmol/L (ref 96–106)
Creatinine, Ser: 0.76 mg/dL (ref 0.57–1.00)
GFR calc Af Amer: 103 mL/min/{1.73_m2} (ref >59)
GFR calc non Af Amer: 89 mL/min/{1.73_m2} (ref >59)
Globulin, Total: 2.7 g/dL (ref 1.5–4.5)
Glucose: 108 mg/dL — ABNORMAL HIGH (ref 65–99)
Potassium: 3.4 mmol/L — ABNORMAL LOW (ref 3.5–5.2)
Sodium: 140 mmol/L (ref 134–144)
Total Protein: 7.2 g/dL (ref 6.0–8.5)

## 2020-02-14 LAB — C-REACTIVE PROTEIN: CRP: 7 mg/L (ref 0–10)

## 2020-02-14 LAB — HEMOGLOBIN A1C
Est. average glucose Bld gHb Est-mCnc: 126 mg/dL
Hgb A1c MFr Bld: 6 % — ABNORMAL HIGH (ref 4.8–5.6)

## 2020-02-14 LAB — URIC ACID: Uric Acid: 5.1 mg/dL (ref 3.0–7.2)

## 2020-02-14 LAB — LIPID PANEL
Chol/HDL Ratio: 4.3 ratio (ref 0.0–4.4)
Cholesterol, Total: 201 mg/dL — ABNORMAL HIGH (ref 100–199)
HDL: 47 mg/dL (ref >39)
LDL Chol Calc (NIH): 135 mg/dL — ABNORMAL HIGH (ref 0–99)
Triglycerides: 107 mg/dL (ref 0–149)
VLDL Cholesterol Cal: 19 mg/dL (ref 5–40)

## 2020-02-14 LAB — HEPATITIS C ANTIBODY: Hep C Virus Ab: <0.1 s/co ratio (ref 0.0–0.9)

## 2020-02-14 LAB — TSH: TSH: 0.976 u[IU]/mL (ref 0.450–4.500)

## 2020-02-14 LAB — ANA: Anti Nuclear Antibody (ANA): NEGATIVE

## 2020-02-14 LAB — SEDIMENTATION RATE: Sed Rate: 29 mm/hr (ref 0–40)

## 2020-02-14 LAB — CYCLIC CITRUL PEPTIDE ANTIBODY, IGG/IGA: Cyclic Citrullin Peptide Ab: 9 units (ref 0–19)

## 2020-02-14 LAB — RHEUMATOID FACTOR: Rheumatoid fact SerPl-aCnc: <10 IU/mL (ref 0.0–13.9)

## 2020-02-14 LAB — VITAMIN D 25 HYDROXY (VIT D DEFICIENCY, FRACTURES): Vit D, 25-Hydroxy: 38 ng/mL (ref 30.0–100.0)

## 2020-02-15 ENCOUNTER — Telehealth: Payer: Self-pay | Admitting: Family

## 2020-02-15 ENCOUNTER — Telehealth: Payer: Self-pay

## 2020-02-15 LAB — PAP LB (LIQUID-BASED)

## 2020-02-15 NOTE — Telephone Encounter (Signed)
When ordering through [facility], you have to include in the comment to include HPV with pap

## 2020-02-15 NOTE — Telephone Encounter (Signed)
LMTCB for lab results & to schedule 6 week CMP.

## 2020-02-15 NOTE — Telephone Encounter (Signed)
Called [facility] and had the HPV added. They are unable to perform the other test which are Chlamydia, Gonorrhea, and Trichomonas.

## 2020-02-15 NOTE — Telephone Encounter (Signed)
Would you mind calling [facility] as to why HPV was NOT included on pap?   I always include HPV

## 2020-02-16 ENCOUNTER — Telehealth: Payer: Self-pay

## 2020-02-16 NOTE — Telephone Encounter (Signed)
I called patient to let her know that paperwork was ready. I need to know if she wants me to fax or does she want to pick up. She did not sign & signature is required.

## 2020-02-17 ENCOUNTER — Other Ambulatory Visit: Payer: Self-pay

## 2020-02-17 ENCOUNTER — Encounter: Payer: Self-pay | Admitting: Family

## 2020-02-17 LAB — HPV APTIMA: HPV Aptima: NEGATIVE

## 2020-02-17 LAB — SPECIMEN STATUS REPORT

## 2020-02-17 MED ORDER — ROSUVASTATIN CALCIUM 10 MG PO TABS: 10.0000 mg | ORAL_TABLET | Freq: Every day | ORAL | 3 refills | Status: DC

## 2020-03-03 ENCOUNTER — Ambulatory Visit: Payer: Managed Care, Other (non HMO) | Admitting: Podiatry

## 2020-03-10 ENCOUNTER — Other Ambulatory Visit: Payer: Self-pay | Admitting: Podiatry

## 2020-03-10 ENCOUNTER — Encounter: Payer: Self-pay | Admitting: Podiatry

## 2020-03-10 ENCOUNTER — Other Ambulatory Visit: Payer: Self-pay

## 2020-03-10 ENCOUNTER — Ambulatory Visit (INDEPENDENT_AMBULATORY_CARE_PROVIDER_SITE_OTHER): Payer: Managed Care, Other (non HMO)

## 2020-03-10 ENCOUNTER — Ambulatory Visit (INDEPENDENT_AMBULATORY_CARE_PROVIDER_SITE_OTHER): Payer: Managed Care, Other (non HMO) | Admitting: Podiatry

## 2020-03-10 DIAGNOSIS — M7989 Other specified soft tissue disorders: Secondary | ICD-10-CM

## 2020-03-10 DIAGNOSIS — M722 Plantar fascial fibromatosis: Secondary | ICD-10-CM

## 2020-03-10 NOTE — Patient Instructions (Addendum)
Pre-Operative Instructions  Congratulations, you have decided to take an important step towards improving your quality of life.  You can be assured that the doctors and staff at [facility] will be with you every step of the way.  Here are some important things you should know:  1. Plan to be at the surgery center/hospital at least 1 (one) hour prior to your scheduled time, unless otherwise directed by the surgical center/hospital staff.  You must have a responsible adult accompany you, remain during the surgery and drive you home.  Make sure you have directions to the surgical center/hospital to ensure you arrive on time. 2. If you are having surgery at [facility] or [facility], you will need a copy of your medical history and physical form from your family physician within one month prior to the date of surgery. We will give you a form for your primary physician to complete.  3. We make every effort to accommodate the date you request for surgery.  However, there are times where surgery dates or times have to be moved.  We will contact you as soon as possible if a change in schedule is required.   4. No aspirin/ibuprofen for one week before surgery.  If you are on aspirin, any non-steroidal anti-inflammatory medications (Mobic, Aleve, Ibuprofen) should not be taken seven (7) days prior to your surgery.  You make take Tylenol for pain prior to surgery.  5. Medications - If you are taking daily heart and blood pressure medications, seizure, reflux, allergy, asthma, anxiety, pain or diabetes medications, make sure you notify the surgery center/hospital before the day of surgery so they can tell you which medications you should take or avoid the day of surgery. 6. No food or drink after midnight the night before surgery unless directed otherwise by surgical center/hospital staff. 7. No alcoholic beverages 24-hours prior to surgery.  No smoking 24-hours prior or 24-hours after  surgery. 8. Wear loose pants or shorts. They should be loose enough to fit over bandages, boots, and casts. 9. Don't wear slip-on shoes. Sneakers are preferred. 10. Bring your boot with you to the surgery center/hospital.  Also bring crutches or a walker if your physician has prescribed it for you.  If you do not have this equipment, it will be provided for you after surgery. 11. If you have not been contacted by the surgery center/hospital by the day before your surgery, call to confirm the date and time of your surgery. 12. Leave-time from work may vary depending on the type of surgery you have.  Appropriate arrangements should be made prior to surgery with your employer. 13. Prescriptions will be provided immediately following surgery by your doctor.  Fill these as soon as possible after surgery and take the medication as directed. Pain medications will not be refilled on weekends and must be approved by the doctor. 14. Remove nail polish on the operative foot and avoid getting pedicures prior to surgery. 15. Wash the night before surgery.  The night before surgery wash the foot and leg well with water and the antibacterial soap provided. Be sure to pay special attention to beneath the toenails and in between the toes.  Wash for at least three (3) minutes. Rinse thoroughly with water and dry well with a towel.  Perform this wash unless told not to do so by your physician.  Enclosed: 1 Ice pack (please put in freezer the night before surgery)   1 Hibiclens skin cleaner     Pre-op instructions  If you have any questions regarding the instructions, please do not hesitate to call our office.  [place]: [address], [place], [place] [address] -- [phone]  [place]: [address] [place], [place] [address] -- [phone]  [place]: [address], [place],  [address] -- [phone]   Website: [email or web address] 

## 2020-03-16 NOTE — Telephone Encounter (Signed)
LM for patient to call back so that I may triage headache.

## 2020-03-19 NOTE — Progress Notes (Signed)
   HPI: 54 y.o. female presenting today as a new patient for evaluation of soft tissue masses that are developed to the plantar arches of the bilateral feet.  Patient states that the left is greater than the right.  It is now starting to bother her with shoes.  She has noticed over the past year or so the lesion has gotten bigger with size.  She currently has not done anything for treatment other than shoe modification with minimal relief.  She presents for further treatment and evaluation  Past Medical History:  Diagnosis Date  . Asthma    childhood  . Hypertension   . UTI (lower urinary tract infection)      Physical Exam: General: The patient is alert and oriented x3 in no acute distress.  Dermatology: Skin is warm, dry and supple bilateral lower extremities. Negative for open lesions or macerations.  Vascular: Palpable pedal pulses bilaterally. No edema or erythema noted. Capillary refill within normal limits.  Neurological: Epicritic and protective threshold grossly intact bilaterally.   Musculoskeletal Exam: Range of motion within normal limits to all pedal and ankle joints bilateral. Muscle strength 5/5 in all groups bilateral.  Palpable nodules noted along the plantar fascia and the arches of the bilateral feet left greater than the right findings consistent with plantar fibroma.  Lesions appear to be adhered to the plantar fascial ligament  Assessment: 1.  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   Plan of Care:  1. Patient evaluated.   2. Today we discussed the conservative versus surgical management of the presenting pathology. The patient opts for surgical management. All possible complications and details of the procedure were explained. All patient questions were answered. No guarantees were expressed or implied. 3. Authorization for surgery was initiated today. Surgery will consist of excision of plantar fibroma left foot. 4.  Return to clinic 1 week  postop  *has worked at [employer] for 37 years    Felecia Shelling, DPM [facility]  Dr. Felecia Shelling, DPM    [address] [place], [place] [address]                Office [phone]  Fax [phone]

## 2020-03-27 ENCOUNTER — Other Ambulatory Visit: Payer: Self-pay

## 2020-03-28 ENCOUNTER — Telehealth: Payer: Self-pay

## 2020-03-28 NOTE — Telephone Encounter (Signed)
DOS 04/20/2020  PLANTAR FIBROMA LT - 28062  [employer] EFFECTIVE DATE - 06/24/2017  PLAN DEDUCTIBLE - $3000.00 W/ $1757.62 MET OUT OF POCKET - $7350.00 W/ $2202.54 MET COPAY $0.00 COINSURANCE - 70% AFTER DEDUCTIBLE  PER AUTOMATED SYSTEM NO PRECERT REQUIRED FOR CPT 28062. CONF # [id number]

## 2020-03-29 ENCOUNTER — Encounter: Payer: Self-pay | Admitting: Family

## 2020-03-29 ENCOUNTER — Ambulatory Visit (INDEPENDENT_AMBULATORY_CARE_PROVIDER_SITE_OTHER): Payer: Managed Care, Other (non HMO) | Admitting: Family

## 2020-03-29 ENCOUNTER — Other Ambulatory Visit: Payer: Self-pay

## 2020-03-29 DIAGNOSIS — Z90710 Acquired absence of both cervix and uterus: Secondary | ICD-10-CM | POA: Diagnosis not present

## 2020-03-29 DIAGNOSIS — E785 Hyperlipidemia, unspecified: Secondary | ICD-10-CM | POA: Diagnosis not present

## 2020-03-29 NOTE — Progress Notes (Signed)
Subjective:    Patient ID: Jaclyn Lin, female    DOB: [birth year], 54 y.o.   MRN: [id number]  CC: Jaclyn Lin is a 54 y.o. female who presents today for follow up.   HPI: Here today for for repeat pap smear as 2 months ago no cervix seen and headache follow up. No new concerns. Feels well today.   Patient had thought she had cervix after hysterectomy due to fibroids. No vaginal bleeding , dyspareunia, abdominal distention, pelvic pain.   Headache has resolved. Report occasional 'mild' frontal HA , however that was a couple of weeks ago. No recent HA.  Has stopped crestor. Had been taking crestor qam.   following with podiatry for plantar fibroma; planning  Upcoming surgery.  HISTORY:  Past Medical History:  Diagnosis Date  . Asthma    childhood  . Hypertension   . UTI (lower urinary tract infection)    Past Surgical History:  Procedure Laterality Date  . ABDOMINAL HYSTERECTOMY     partial, Dr. 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 02/11/20 No cervix on exam today. Will repeat pap next year as prior PCP.   Marland Kitchen VAGINAL DELIVERY     2   Family History  Problem Relation Age of Onset  . Hypertension Mother   . Cancer Maternal Grandfather        bone  . Asthma Maternal Grandmother   . Brain cancer Maternal Grandmother   . Asthma Son     Allergies: Codeine Current Outpatient Medications on File Prior to Visit  Medication Sig Dispense Refill  . albuterol (PROVENTIL HFA;VENTOLIN HFA) 108 (90 Base) MCG/ACT inhaler Inhale 2 puffs into the lungs every 6 (six) hours as needed for wheezing or shortness of breath. 1 Inhaler 1  . ALPRAZolam (XANAX) 0.25 MG tablet Take 1 tablet (0.25 mg total) by mouth daily as needed for anxiety or sleep. 30 tablet 1  . budesonide-formoterol (SYMBICORT) 80-4.5 MCG/ACT inhaler Inhale 2 puffs into the lungs 2 (two) times daily. 1 Inhaler 2  . diclofenac Sodium (VOLTAREN) 1 % GEL Apply 4 g topically 4 (four) times daily. 50 g 1    . meloxicam (MOBIC) 7.5 MG tablet TAKE 1 TABLET(7.5 MG) BY MOUTH DAILY AS NEEDED FOR PAIN 90 tablet 0  . rosuvastatin (CRESTOR) 10 MG tablet Take 1 tablet (10 mg total) by mouth daily. 90 tablet 3  . triamterene-hydrochlorothiazide (DYAZIDE) 37.5-25 MG capsule TAKE 1 CAPSULE BY MOUTH EVERY DAY 90 capsule 1  . montelukast (SINGULAIR) 10 MG tablet Take 1 tablet (10 mg total) by mouth 1 day or 1 dose for 1 dose. 90 tablet 3   No current facility-administered medications on file prior to visit.    Social History   Tobacco Use  . Smoking status: Current Every Day Smoker    Packs/day: 0.50    Years: 20.00    Pack years: 10.00    Types: Cigarettes    Start date: 02/11/2016  . Smokeless tobacco: Never Used  . Tobacco comment: smoking every couple of days  Vaping Use  . Vaping Use: Never used  Substance Use Topics  . Alcohol use: Yes    Comment: Beer on weekends; 6 pack  . Drug use: No    Review of Systems  Constitutional: Negative for chills and fever.  Respiratory: Negative for cough.   Cardiovascular: Negative for chest pain and palpitations.  Gastrointestinal: Negative for nausea and vomiting.      Objective:    BP 112/72  Pulse 87   Temp 98.1 F (36.7 C)   Ht 5' 5.98" (1.676 m)   Wt 180 lb 3.2 oz (81.7 kg)   SpO2 98%   BMI 29.10 kg/m  BP Readings from Last 3 Encounters:  03/29/20 112/72  02/11/20 132/88  11/11/19 136/89   Wt Readings from Last 3 Encounters:  03/29/20 180 lb 3.2 oz (81.7 kg)  02/11/20 180 lb 9.6 oz (81.9 kg)  11/11/19 175 lb 0.7 oz (79.4 kg)    Physical Exam Vitals reviewed.  Constitutional:      Appearance: She is well-developed.  Eyes:     Conjunctiva/sclera: Conjunctivae normal.  Cardiovascular:     Rate and Rhythm: Normal rate and regular rhythm.     Pulses: Normal pulses.     Heart sounds: Normal heart sounds.  Pulmonary:     Effort: Pulmonary effort is normal.     Breath sounds: Normal breath sounds. No wheezing, rhonchi or  rales.  Genitourinary:    Labia:        Right: No rash or tenderness.        Left: No rash or tenderness.      Vagina: No tenderness or bleeding.     Comments: No cervical os.  Skin:    General: Skin is warm and dry.  Neurological:     Mental Status: She is alert.  Psychiatric:        Speech: Speech normal.        Behavior: Behavior normal.        Thought Content: Thought content normal.        Assessment & Plan:   Problem List Items Addressed This Visit      Other   History of hysterectomy    After second exam, I feel reassured thus reassured patient that no cervix seen on exam today. Will send pap off of vaginal cells however she doesn't require further screening for cervical cancer. Patient was appreciatative of thoroughness and all questions answered.       Relevant Orders   IGP, Aptima HPV   HLD (hyperlipidemia)    HA resolved. Advised to restart crestor as not sure if coincidence or adverse effect of medication. Continue crestor 10mg  and cmp in 88m weeks.      Relevant Orders   Comprehensive metabolic panel       I am having 11m. Demchak maintain her albuterol, budesonide-formoterol, ALPRAZolam, montelukast, triamterene-hydrochlorothiazide, meloxicam, diclofenac Sodium, and rosuvastatin.   No orders of the defined types were placed in this encounter.   Return precautions given.   Risks, benefits, and alternatives of the medications and treatment plan prescribed today were discussed, and patient expressed understanding.   Education regarding symptom management and diagnosis given to patient on AVS.  Continue to follow with Kathrin Penner, FNP for routine health maintenance.   Allegra Grana and I agreed with plan.   Jaclyn Rainier, FNP

## 2020-03-29 NOTE — Patient Instructions (Addendum)
Lets restart the crestor , and take at night. Let me know if headache recurs.   Let me know how you are doing , and please follow up after your surgery.  Stay safe!

## 2020-03-29 NOTE — Assessment & Plan Note (Signed)
After second exam, I feel reassured thus reassured patient that no cervix seen on exam today. Will send pap off of vaginal cells however she doesn't require further screening for cervical cancer. Patient was appreciatative of thoroughness and all questions answered.

## 2020-03-29 NOTE — Assessment & Plan Note (Signed)
HA resolved. Advised to restart crestor as not sure if coincidence or adverse effect of medication. Continue crestor 10mg  and cmp in 48m weeks.

## 2020-04-03 ENCOUNTER — Telehealth: Payer: Self-pay | Admitting: *Deleted

## 2020-04-03 ENCOUNTER — Other Ambulatory Visit: Payer: Self-pay | Admitting: Family

## 2020-04-03 DIAGNOSIS — M79601 Pain in right arm: Secondary | ICD-10-CM

## 2020-04-03 DIAGNOSIS — I10 Essential (primary) hypertension: Secondary | ICD-10-CM

## 2020-04-03 LAB — IGP, APTIMA HPV: HPV Aptima: NEGATIVE

## 2020-04-03 NOTE — Telephone Encounter (Signed)
"  I'm calling to see if my FMLA people had gotten in touch with you guys and sent you the papers over.  You can leave me a message."

## 2020-04-10 ENCOUNTER — Other Ambulatory Visit: Payer: Self-pay

## 2020-04-10 DIAGNOSIS — J45909 Unspecified asthma, uncomplicated: Secondary | ICD-10-CM

## 2020-04-10 DIAGNOSIS — J309 Allergic rhinitis, unspecified: Secondary | ICD-10-CM

## 2020-04-10 MED ORDER — MONTELUKAST SODIUM 10 MG PO TABS: 10.0000 mg | ORAL_TABLET | ORAL | 3 refills | Status: DC

## 2020-04-13 NOTE — Telephone Encounter (Signed)
Jaclyn Lin completed her FMLA paperwork.

## 2020-04-17 ENCOUNTER — Encounter: Payer: Self-pay | Admitting: Family

## 2020-04-20 ENCOUNTER — Other Ambulatory Visit: Payer: Self-pay | Admitting: Podiatry

## 2020-04-20 DIAGNOSIS — M722 Plantar fascial fibromatosis: Secondary | ICD-10-CM | POA: Diagnosis not present

## 2020-04-20 MED ORDER — OXYCODONE-ACETAMINOPHEN 5-325 MG PO TABS: 1.0000 | ORAL_TABLET | ORAL | 0 refills | Status: DC | PRN

## 2020-04-20 MED ORDER — MELOXICAM 15 MG PO TABS: 15.0000 mg | ORAL_TABLET | Freq: Every day | ORAL | 1 refills | Status: DC

## 2020-04-20 NOTE — Progress Notes (Signed)
PRN postop 

## 2020-04-25 ENCOUNTER — Encounter: Payer: Self-pay | Admitting: Podiatry

## 2020-04-28 ENCOUNTER — Encounter: Payer: Self-pay | Admitting: Podiatry

## 2020-04-28 ENCOUNTER — Other Ambulatory Visit: Payer: Self-pay

## 2020-04-28 ENCOUNTER — Ambulatory Visit (INDEPENDENT_AMBULATORY_CARE_PROVIDER_SITE_OTHER): Payer: Managed Care, Other (non HMO) | Admitting: Podiatry

## 2020-04-28 VITALS — BP 126/79 | HR 71 | Temp 97.7°F

## 2020-04-28 DIAGNOSIS — M79676 Pain in unspecified toe(s): Secondary | ICD-10-CM

## 2020-04-28 DIAGNOSIS — M722 Plantar fascial fibromatosis: Secondary | ICD-10-CM

## 2020-04-28 DIAGNOSIS — Z9889 Other specified postprocedural states: Secondary | ICD-10-CM

## 2020-04-28 NOTE — Progress Notes (Addendum)
   Subjective:  Patient presents today status post excision of plantar fibroma left foot.. DOS: 04/20/2020.  Patient states that she is doing well.  She does have some pain and tenderness to the plantar aspect of the foot.  She has mostly been applying pressure on her heel when walking in the surgical shoe.  No new complaints at this time.  Past Medical History:  Diagnosis Date  . Asthma    childhood  . Hypertension   . UTI (lower urinary tract infection)       Objective/Physical Exam Neurovascular status intact.  Skin incisions appear to be well coapted with sutures intact. No sign of infectious process noted. No dehiscence. No active bleeding noted. Moderate edema noted to the surgical extremity.  There is some localized swelling around the incision site potentially concerning for an underlying hematoma.  This does not appear to be large enough to drain and it should resorb with time.  Assessment: 1. s/p excision plantar fibroma left. DOS: 04/20/2020   Plan of Care:  1. Patient was evaluated 2.  Dressings changed today.  Keep clean dry and intact x1 week 3.  Continue weightbearing in the postsurgical shoe 4.  Return to clinic in 1 week  *Has worked at [employer] for 37 years  Felecia Shelling, DPM [facility]  Dr. Felecia Shelling, DPM    [address] [place], [place] [address]                Office [phone]  Fax [phone]

## 2020-05-05 ENCOUNTER — Ambulatory Visit (INDEPENDENT_AMBULATORY_CARE_PROVIDER_SITE_OTHER): Payer: Managed Care, Other (non HMO) | Admitting: Podiatry

## 2020-05-05 ENCOUNTER — Encounter: Payer: Self-pay | Admitting: Podiatry

## 2020-05-05 ENCOUNTER — Other Ambulatory Visit: Payer: Self-pay

## 2020-05-05 DIAGNOSIS — M722 Plantar fascial fibromatosis: Secondary | ICD-10-CM

## 2020-05-05 DIAGNOSIS — Z9889 Other specified postprocedural states: Secondary | ICD-10-CM

## 2020-05-05 NOTE — Progress Notes (Signed)
   Subjective:  Patient presents today status post excision of plantar fibroma left foot.. DOS: 04/20/2020.  Patient states that she is doing well.  Patient states that she is much better since last week.  She continues to improve daily.  No new complaints at this time  Past Medical History:  Diagnosis Date  . Asthma    childhood  . Hypertension   . UTI (lower urinary tract infection)       Objective/Physical Exam Neurovascular status intact.  Skin incisions appear to be well coapted with sutures intact. No sign of infectious process noted. No dehiscence. No active bleeding noted.  The edema around the incision site concerning for an underlying hematoma appears to be improved today.  It is dry and stable and should resorb over time.  Minimally tender to palpation  Assessment: 1. s/p excision plantar fibroma left. DOS: 04/20/2020   Plan of Care:  1. Patient was evaluated 2.  Recommend daily massage to the foot and along the fibroma lesion to break up any scar tissue adhesion 3.  Return to clinic in 1 week for suture removal  *Has worked at [employer] for 37 years  Felecia Shelling, DPM [facility]  Dr. Felecia Shelling, DPM    [address] [place], [place] [address]                Office [phone]  Fax [phone]

## 2020-05-10 ENCOUNTER — Other Ambulatory Visit: Payer: Managed Care, Other (non HMO)

## 2020-05-12 ENCOUNTER — Other Ambulatory Visit: Payer: Self-pay

## 2020-05-12 ENCOUNTER — Encounter: Payer: Self-pay | Admitting: Podiatry

## 2020-05-12 ENCOUNTER — Ambulatory Visit (INDEPENDENT_AMBULATORY_CARE_PROVIDER_SITE_OTHER): Payer: Managed Care, Other (non HMO) | Admitting: Podiatry

## 2020-05-12 DIAGNOSIS — M722 Plantar fascial fibromatosis: Secondary | ICD-10-CM

## 2020-05-12 DIAGNOSIS — Z9889 Other specified postprocedural states: Secondary | ICD-10-CM

## 2020-05-12 MED ORDER — GENTAMICIN SULFATE 0.1 % EX CREA: 1.0000 "application " | TOPICAL_CREAM | Freq: Two times a day (BID) | CUTANEOUS | 1 refills | Status: DC

## 2020-05-12 NOTE — Progress Notes (Signed)
   Subjective:  Patient presents today status post excision of plantar fibroma left foot.. DOS: 04/20/2020.  Patient states that she is doing well.  Patient continues to see improvement.  Today she is still wearing the postsurgical shoe.  Past Medical History:  Diagnosis Date  . Asthma    childhood  . Hypertension   . UTI (lower urinary tract infection)       Objective/Physical Exam Neurovascular status intact.  Skin incisions appear to be well coapted with sutures intact. No sign of infectious process noted. No dehiscence. No active bleeding noted.  The edema around the incision site concerning for an underlying hematoma appears to be improved today.  It is dry and stable and should resorb over time.  Minimally tender to palpation  Assessment: 1. s/p excision plantar fibroma left. DOS: 04/20/2020   Plan of Care:  1. Patient was evaluated 2.  Sutures removed today 3.  Prescription for gentamicin cream massage and apply daily to the surgical area 4.  Return to clinic in 3-4 weeks  *Has worked at [employer] for 37 years.  Expected return to work at the beginning of the new year  Felecia Shelling, DPM [facility]  Dr. Felecia Shelling, DPM    [address] [place], [place] [address]                Office [phone]  Fax [phone]

## 2020-05-23 ENCOUNTER — Encounter: Payer: Managed Care, Other (non HMO) | Admitting: Podiatry

## 2020-05-24 ENCOUNTER — Encounter: Payer: Self-pay | Admitting: *Deleted

## 2020-06-20 ENCOUNTER — Encounter: Payer: Managed Care, Other (non HMO) | Admitting: Podiatry

## 2020-06-27 ENCOUNTER — Ambulatory Visit (INDEPENDENT_AMBULATORY_CARE_PROVIDER_SITE_OTHER): Payer: Managed Care, Other (non HMO) | Admitting: Podiatry

## 2020-06-27 ENCOUNTER — Other Ambulatory Visit: Payer: Self-pay

## 2020-06-27 DIAGNOSIS — Z9889 Other specified postprocedural states: Secondary | ICD-10-CM

## 2020-06-27 DIAGNOSIS — M722 Plantar fascial fibromatosis: Secondary | ICD-10-CM

## 2020-07-02 ENCOUNTER — Other Ambulatory Visit: Payer: Self-pay | Admitting: Family

## 2020-07-02 DIAGNOSIS — M79601 Pain in right arm: Secondary | ICD-10-CM

## 2020-07-11 ENCOUNTER — Encounter: Payer: Self-pay | Admitting: Family

## 2020-07-12 NOTE — Telephone Encounter (Signed)
Left message to call back and try to offer an appointment to be seen.

## 2020-07-12 NOTE — Telephone Encounter (Signed)
Patient returned office phone call. Per note patient was asked to set up a vitual. Patient said she was feeling a little better today and will call back next week if she needs to be seen.

## 2020-07-19 NOTE — Progress Notes (Signed)
   Subjective:  Patient presents today status post excision of plantar fibroma left foot.. DOS: 04/20/2020.  Patient states that she is doing well.  She has been walking in regular tennis shoes with minimal pain.  No new complaints at this time  Past Medical History:  Diagnosis Date  . Asthma    childhood  . Hypertension   . UTI (lower urinary tract infection)       Objective/Physical Exam Neurovascular status intact.  Skin incisions appear to be well coapted and healed. No sign of infectious process noted. No dehiscence. No active bleeding noted.  Negative for any significant edema around the surgical site.  No evidence of underlying hematoma.  Appears to be resolved  Assessment: 1. s/p excision plantar fibroma left. DOS: 04/20/2020   Plan of Care:  1. Patient was evaluated 2.  Patient may now resume full activity no restrictions 3.  Recommend good supportive shoes 4.  Return to clinic as needed  *Has worked at [employer] for 37 years.  Expected return to work at the beginning of the new year  Felecia Shelling, DPM [facility]  Dr. Felecia Shelling, DPM    [address] [place], [place] [address]                Office [phone]  Fax [phone]

## 2020-08-11 ENCOUNTER — Telehealth (INDEPENDENT_AMBULATORY_CARE_PROVIDER_SITE_OTHER): Payer: Managed Care, Other (non HMO) | Admitting: Family

## 2020-08-11 ENCOUNTER — Encounter: Payer: Self-pay | Admitting: Family

## 2020-08-11 VITALS — Ht 65.98 in | Wt 178.0 lb

## 2020-08-11 DIAGNOSIS — J40 Bronchitis, not specified as acute or chronic: Secondary | ICD-10-CM

## 2020-08-11 DIAGNOSIS — J45909 Unspecified asthma, uncomplicated: Secondary | ICD-10-CM | POA: Diagnosis not present

## 2020-08-11 MED ORDER — BENZONATATE 100 MG PO CAPS: 100.0000 mg | ORAL_CAPSULE | Freq: Three times a day (TID) | ORAL | 1 refills | Status: DC | PRN

## 2020-08-11 MED ORDER — AMOXICILLIN-POT CLAVULANATE 875-125 MG PO TABS: 1.0000 | ORAL_TABLET | Freq: Two times a day (BID) | ORAL | 0 refills | Status: AC

## 2020-08-11 MED ORDER — BUDESONIDE-FORMOTEROL FUMARATE 80-4.5 MCG/ACT IN AERO: 2.0000 | INHALATION_SPRAY | Freq: Two times a day (BID) | RESPIRATORY_TRACT | 2 refills | Status: DC

## 2020-08-11 NOTE — Progress Notes (Signed)
Virtual Visit via Video Note  I connected with@  on 08/11/20 at 12:00 PM EST by a video enabled telemedicine application and verified that I am speaking with the correct person using two identifiers.  Location patient: home Location provider:work  Persons participating in the virtual visit: patient, provider  I discussed the limitations of evaluation and management by telemedicine and the availability of in person appointments. The patient expressed understanding and agreed to proceed.   HPI: Acute visit Productive cough x 3 weeks, unchanged.  Coughing more at night. No wheezing, sob, fever, cp, sore throat.  Endorses ear pain. Has tried mucinex, tylenol without relief. hasnt tried albuterol.  Home covid test negative at one week after onset of symptoms.  covid vaccinated, no booster.  No recent antibiotics.   H/o asthma Smoker Ran out of symbicort. Compliant with singulair.    ROS: See pertinent positives and negatives per HPI.    EXAM:  VITALS per patient if applicable: Ht 5' 5.98" (1.676 m)   Wt 178 lb (80.7 kg)   BMI 28.74 kg/m  BP Readings from Last 3 Encounters:  04/28/20 126/79  03/29/20 112/72  02/11/20 132/88   Wt Readings from Last 3 Encounters:  08/11/20 178 lb (80.7 kg)  03/29/20 180 lb 3.2 oz (81.7 kg)  02/11/20 180 lb 9.6 oz (81.9 kg)    GENERAL: alert, oriented, appears well and in no acute distress  HEENT: atraumatic, conjunttiva clear, no obvious abnormalities on inspection of external nose and ears  NECK: normal movements of the head and neck  LUNGS: on inspection no signs of respiratory distress, breathing rate appears normal, no obvious gross SOB, gasping or wheezing  CV: no obvious cyanosis  MS: moves all visible extremities without noticeable abnormality  PSYCH/NEURO: pleasant and cooperative, no obvious depression or anxiety, speech and thought processing grossly intact  ASSESSMENT AND PLAN:  Discussed the following assessment and  plan:  Problem List Items Addressed This Visit      Respiratory   Bronchitis    Afebrile. Patient well appearing and no acute respiratory distress. Differential asthma or copd exacerbation versus URI.  Advised home covid test one more time due to duration of symptoms. Start augmentin for suspected secondary bacterial infection. Resume symbicort. Patient declines CXR and would like to see how she feels on antibiotic. Advised probiotics as well.       Uncomplicated asthma - Primary   Relevant Medications   benzonatate (TESSALON) 100 MG capsule   amoxicillin-clavulanate (AUGMENTIN) 875-125 MG tablet   budesonide-formoterol (SYMBICORT) 80-4.5 MCG/ACT inhaler      -we discussed possible serious and likely etiologies, options for evaluation and workup, limitations of telemedicine visit vs in person visit, treatment, treatment risks and precautions. Pt prefers to treat via telemedicine empirically rather then risking or undertaking an in person visit at this moment.  .   I discussed the assessment and treatment plan with the patient. The patient was provided an opportunity to ask questions and all were answered. The patient agreed with the plan and demonstrated an understanding of the instructions.   The patient was advised to call back or seek an in-person evaluation if the symptoms worsen or if the condition fails to improve as anticipated.   Rennie Plowman, FNP

## 2020-08-11 NOTE — Assessment & Plan Note (Addendum)
Afebrile. Patient well appearing and no acute respiratory distress. Differential asthma or copd exacerbation versus URI.  Advised home covid test one more time due to duration of symptoms. Start augmentin for suspected secondary bacterial infection. Resume symbicort. Patient declines CXR and would like to see how she feels on antibiotic. Advised probiotics as well.

## 2020-11-03 ENCOUNTER — Other Ambulatory Visit: Payer: Self-pay | Admitting: Family

## 2020-11-03 DIAGNOSIS — I10 Essential (primary) hypertension: Secondary | ICD-10-CM

## 2020-11-28 ENCOUNTER — Telehealth: Payer: Self-pay

## 2020-11-28 NOTE — Telephone Encounter (Signed)
I spoke with patient due to her scheduling an appointment for 6/20. She stated in notes that she was having leg swelling & tingling in her hands. I called to triage since she did not need to wait until the 20th to be seen. She stated that she can feel a heaviness in her legs bilaterally. She said that her mother first noticed that her legs looked swollen. No pain, heat or redness. She said that one leg was no bigger than the other. She takes Dyazide regularly as well. No other symptoms in regards to swelling. I asked about the tingling in hands. She said that this is positional & happens at night a lot when sleeping. She stated that it feels like her hands are asleep. Since symptoms have been going on for over a few weeks I scheduled patient tomorrow with Dr. French Ana at 2:30p. Patient overall feels well just not sure why the leg swelling & tingling in hands.

## 2020-11-30 ENCOUNTER — Ambulatory Visit (INDEPENDENT_AMBULATORY_CARE_PROVIDER_SITE_OTHER): Payer: Managed Care, Other (non HMO) | Admitting: Internal Medicine

## 2020-11-30 ENCOUNTER — Other Ambulatory Visit: Payer: Self-pay

## 2020-11-30 ENCOUNTER — Encounter: Payer: Self-pay | Admitting: Internal Medicine

## 2020-11-30 VITALS — BP 120/80 | HR 79 | Temp 98.5°F | Ht 65.0 in | Wt 180.0 lb

## 2020-11-30 DIAGNOSIS — E785 Hyperlipidemia, unspecified: Secondary | ICD-10-CM | POA: Diagnosis not present

## 2020-11-30 DIAGNOSIS — M791 Myalgia, unspecified site: Secondary | ICD-10-CM

## 2020-11-30 DIAGNOSIS — R200 Anesthesia of skin: Secondary | ICD-10-CM | POA: Diagnosis not present

## 2020-11-30 DIAGNOSIS — R29898 Other symptoms and signs involving the musculoskeletal system: Secondary | ICD-10-CM

## 2020-11-30 DIAGNOSIS — Z20822 Contact with and (suspected) exposure to covid-19: Secondary | ICD-10-CM

## 2020-11-30 DIAGNOSIS — R202 Paresthesia of skin: Secondary | ICD-10-CM

## 2020-11-30 DIAGNOSIS — E538 Deficiency of other specified B group vitamins: Secondary | ICD-10-CM

## 2020-11-30 DIAGNOSIS — E559 Vitamin D deficiency, unspecified: Secondary | ICD-10-CM

## 2020-11-30 DIAGNOSIS — R5383 Other fatigue: Secondary | ICD-10-CM

## 2020-11-30 DIAGNOSIS — I10 Essential (primary) hypertension: Secondary | ICD-10-CM

## 2020-11-30 DIAGNOSIS — Z1231 Encounter for screening mammogram for malignant neoplasm of breast: Secondary | ICD-10-CM

## 2020-11-30 DIAGNOSIS — Z1211 Encounter for screening for malignant neoplasm of colon: Secondary | ICD-10-CM

## 2020-11-30 DIAGNOSIS — E876 Hypokalemia: Secondary | ICD-10-CM

## 2020-11-30 DIAGNOSIS — R7303 Prediabetes: Secondary | ICD-10-CM

## 2020-11-30 NOTE — Progress Notes (Signed)
Chief Complaint  Patient presents with   Acute Visit    Heavy legs/ tingling in both hands   F/u  Numbness in b/l hands and heaviness in b/l legs x 3 months arms and legs feel asleep and heavy   Form for [facility] filled out today  Will need fasting labs with [facility] working x 38 years and inform free if goes to [facility] facility    Review of Systems  Constitutional:  Negative for weight loss.  HENT:  Negative for hearing loss.   Eyes:  Negative for blurred vision.  Respiratory:  Negative for shortness of breath.   Cardiovascular:  Negative for chest pain.  Musculoskeletal:  Negative for back pain.  Skin:  Negative for rash.  Neurological:  Positive for tingling, sensory change and weakness.  Psychiatric/Behavioral:  Negative for depression.   Past Medical History:  Diagnosis Date   Asthma    childhood   Hypertension    UTI (lower urinary tract infection)    Past Surgical History:  Procedure Laterality Date   ABDOMINAL HYSTERECTOMY     partial, Dr. Vernie Ammons; has ovaries; done due to fibriods.03/29/20 No cervix on exam today.     VAGINAL DELIVERY     2   Family History  Problem Relation Age of Onset   Hypertension Mother    Cancer Maternal Grandfather        bone   Asthma Maternal Grandmother    Brain cancer Maternal Grandmother    Asthma Son    Social History   Socioeconomic History   Marital status: Divorced    Spouse name: Not on file   Number of children: Not on file   Years of education: Not on file   Highest education level: Not on file  Occupational History   Not on file  Tobacco Use   Smoking status: Every Day    Packs/day: 0.50    Years: 20.00    Pack years: 10.00    Types: Cigarettes    Start date: 02/11/2016   Smokeless tobacco: Never   Tobacco comments:    smoking every couple of days  Vaping Use   Vaping Use: Never used  Substance and Sexual Activity   Alcohol use: Yes    Comment: Beer on weekends; 6 pack   Drug use: No   Sexual activity:  Yes    Birth control/protection: Condom  Other Topics Concern   Not on file  Social History Narrative   Works at [employer] and [employer].   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 Had two sons however lost one son due to gun.       Fun-enjoys dancing and singing      Diet- regular   Exercise-not right now         Social Determinants of Health   Financial Resource Strain: Not on file  Food Insecurity: Not on file  Transportation Needs: Not on file  Physical Activity: Not on file  Stress: Not on file  Social Connections: Not on file  Intimate Partner Violence: Not on file   Current Meds  Medication Sig   albuterol (PROVENTIL HFA;VENTOLIN HFA) 108 (90 Base) MCG/ACT inhaler Inhale 2 puffs into the lungs every 6 (six) hours as needed for wheezing or shortness of breath.   ALPRAZolam (XANAX) 0.25 MG tablet Take 1  tablet (0.25 mg total) by mouth daily as needed for anxiety or sleep.   budesonide-formoterol (SYMBICORT) 80-4.5 MCG/ACT inhaler Inhale 2 puffs into the lungs 2 (two) times daily.   FLOWFLEX COVID-19 AG HOME TEST KIT TEST AS DIRECTED TODAY   triamterene-hydrochlorothiazide (DYAZIDE) 37.5-25 MG capsule TAKE 1 CAPSULE BY MOUTH EVERY DAY   Allergies  Allergen Reactions   Codeine Itching   No results found for this or any previous visit (from the past 2160 hour(s)). Objective  Body mass index is 29.95 kg/m. Wt Readings from Last 3 Encounters:  11/30/20 180 lb (81.6 kg)  08/11/20 178 lb (80.7 kg)  03/29/20 180 lb 3.2 oz (81.7 kg)   Temp Readings from Last 3 Encounters:  11/30/20 98.5 F (36.9 C) (Oral)  04/28/20 97.7 F (36.5 C)  03/29/20 98.1 F (36.7 C)   BP Readings from Last 3 Encounters:  11/30/20 120/80  04/28/20 126/79  03/29/20 112/72   Pulse Readings from Last 3 Encounters:  11/30/20 79  04/28/20 71  03/29/20 87     Physical Exam Vitals and nursing note reviewed.  Constitutional:      Appearance: Normal appearance. She is well-developed and well-groomed. She is obese.  HENT:     Head: Normocephalic and atraumatic.  Cardiovascular:     Rate and Rhythm: Normal rate and regular rhythm.     Heart sounds: Normal heart sounds. No murmur heard. Pulmonary:     Effort: Pulmonary effort is normal.     Breath sounds: Normal breath sounds.  Abdominal:     General: Abdomen is flat. Bowel sounds are normal.  Skin:    General: Skin is warm and dry.  Neurological:     General: No focal deficit present.     Mental Status: She is alert and oriented to person, place, and time. Mental status is at baseline.     Gait: Gait normal.  Psychiatric:        Mood and Affect: Mood normal.        Behavior: Behavior normal. Behavior is cooperative.        Thought Content: Thought content normal.        Judgment: Judgment normal.    Assessment  Plan  Numbness and tingling in both hands - Plan: CK (Creatine Kinase), Urinalysis, Routine w reflex microscopic, TSH, DG Lumbar Spine Complete, Ambulatory referral to Neurology emg/ncs  Myalgia - Plan: CK (Creatine Kinase), Ambulatory referral to Neurology Weakness of both lower extremities - Plan: Urinalysis, Routine w reflex microscopic, DG Lumbar Spine Complete, Ambulatory referral to Neurology  Hyperlipidemia, unspecified hyperlipidemia type -  Not taking crestor 10 mg qhs   Hypertension, unspecified type - Plan: CBC with Differential/Platelet, Comprehensive metabolic panel Controlled dyazide 37.5-25 mg qd  Prediabetes - Plan: Hemoglobin A1c,  Vitamin D deficiency - Plan: VITAMIN D 25 Hydroxy (Vit-D Deficiency, Fractures),   Exposure to COVID-19 virus - Plan: SARS-CoV-2 Semi-Quantitative Total Antibody, Spike,   Fatigue, unspecified type Fasting labs [facility]  Hypokalemia - Plan: Magnesium, CANCELED: Magnesium  B12 deficiency - Plan: Vitamin B12, CANCELED:  Vitamin B12  HM Screening mammogram for breast cancer - Plan: MM 3D SCREEN BREAST BILATERAL  Encounter for screening colonoscopy - Plan: Ambulatory referral to Gastroenterology  [facility]   Provider: Dr. Olivia Mackie McLean-Scocuzza-Internal Medicine

## 2020-11-30 NOTE — Patient Instructions (Signed)
Please check covid 19 pfizer shot rec booster 3rd and 4th

## 2020-12-01 NOTE — Telephone Encounter (Signed)
Not sure why you can't see? She did see Dr. French Ana yesterday & I saw patient in office as well.

## 2020-12-01 NOTE — Telephone Encounter (Signed)
Call pt  I dont see that she was seen by dr Shirlee Latch for leg swelling? Is she doing okay? If acute or worsening symptoms, please advise urgent care today

## 2020-12-04 ENCOUNTER — Other Ambulatory Visit: Payer: Self-pay | Admitting: Internal Medicine

## 2020-12-05 LAB — URINALYSIS, ROUTINE W REFLEX MICROSCOPIC
Bilirubin, UA: NEGATIVE
Glucose, UA: NEGATIVE
Ketones, UA: NEGATIVE
Leukocytes,UA: NEGATIVE
Nitrite, UA: NEGATIVE
Specific Gravity, UA: >=1.03 — AB (ref 1.005–1.030)
Urobilinogen, Ur: 0.2 mg/dL (ref 0.2–1.0)
pH, UA: 5.5 (ref 5.0–7.5)

## 2020-12-05 LAB — CBC WITH DIFFERENTIAL/PLATELET
Basophils Absolute: 0.1 10*3/uL (ref 0.0–0.2)
Basos: 1 %
EOS (ABSOLUTE): 0.2 10*3/uL (ref 0.0–0.4)
Eos: 4 %
Hematocrit: 39.6 % (ref 34.0–46.6)
Hemoglobin: 12.7 g/dL (ref 11.1–15.9)
Immature Grans (Abs): 0 10*3/uL (ref 0.0–0.1)
Immature Granulocytes: 0 %
Lymphocytes Absolute: 2.5 10*3/uL (ref 0.7–3.1)
Lymphs: 46 %
MCH: 26.8 pg (ref 26.6–33.0)
MCHC: 32.1 g/dL (ref 31.5–35.7)
MCV: 84 fL (ref 79–97)
Monocytes Absolute: 0.6 10*3/uL (ref 0.1–0.9)
Monocytes: 12 %
Neutrophils Absolute: 2 10*3/uL (ref 1.4–7.0)
Neutrophils: 37 %
Platelets: 260 10*3/uL (ref 150–450)
RBC: 4.74 x10E6/uL (ref 3.77–5.28)
RDW: 13.8 % (ref 11.7–15.4)
WBC: 5.5 10*3/uL (ref 3.4–10.8)

## 2020-12-05 LAB — COMPREHENSIVE METABOLIC PANEL
ALT: 17 IU/L (ref 0–32)
AST: 19 IU/L (ref 0–40)
Albumin/Globulin Ratio: 1.5 (ref 1.2–2.2)
Albumin: 4.3 g/dL (ref 3.8–4.9)
Alkaline Phosphatase: 68 IU/L (ref 44–121)
BUN/Creatinine Ratio: 20 (ref 9–23)
BUN: 18 mg/dL (ref 6–24)
Bilirubin Total: 0.3 mg/dL (ref 0.0–1.2)
CO2: 25 mmol/L (ref 20–29)
Calcium: 10 mg/dL (ref 8.7–10.2)
Chloride: 102 mmol/L (ref 96–106)
Creatinine, Ser: 0.9 mg/dL (ref 0.57–1.00)
Globulin, Total: 2.9 g/dL (ref 1.5–4.5)
Glucose: 119 mg/dL — ABNORMAL HIGH (ref 65–99)
Potassium: 4.1 mmol/L (ref 3.5–5.2)
Sodium: 141 mmol/L (ref 134–144)
Total Protein: 7.2 g/dL (ref 6.0–8.5)
eGFR: 75 mL/min/{1.73_m2} (ref >59)

## 2020-12-05 LAB — MICROSCOPIC EXAMINATION: Casts: NONE SEEN /lpf

## 2020-12-05 LAB — SPECIMEN STATUS REPORT

## 2020-12-05 LAB — CK: Total CK: 145 U/L (ref 32–182)

## 2020-12-06 LAB — SPECIMEN STATUS REPORT

## 2020-12-06 LAB — MAGNESIUM: Magnesium: 2.1 mg/dL (ref 1.6–2.3)

## 2020-12-06 LAB — LIPID PANEL
Chol/HDL Ratio: 4 ratio (ref 0.0–4.4)
Cholesterol, Total: 212 mg/dL — ABNORMAL HIGH (ref 100–199)
HDL: 53 mg/dL (ref >39)
LDL Chol Calc (NIH): 138 mg/dL — ABNORMAL HIGH (ref 0–99)
Triglycerides: 117 mg/dL (ref 0–149)
VLDL Cholesterol Cal: 21 mg/dL (ref 5–40)

## 2020-12-06 LAB — SARS-COV-2 SEMI-QUANTITATIVE TOTAL ANTIBODY, SPIKE: SARS-CoV-2 Spike Ab Interp: POSITIVE

## 2020-12-06 LAB — TSH: TSH: 2.64 u[IU]/mL (ref 0.450–4.500)

## 2020-12-06 LAB — HEMOGLOBIN A1C
Est. average glucose Bld gHb Est-mCnc: 128 mg/dL
Hgb A1c MFr Bld: 6.1 % — ABNORMAL HIGH (ref 4.8–5.6)

## 2020-12-06 LAB — VITAMIN B12: Vitamin B-12: 558 pg/mL (ref 232–1245)

## 2020-12-06 LAB — VITAMIN D 25 HYDROXY (VIT D DEFICIENCY, FRACTURES): Vit D, 25-Hydroxy: 40.6 ng/mL (ref 30.0–100.0)

## 2020-12-06 LAB — SARS-COV-2 SPIKE AB DILUTION: SARS-CoV-2 Spike Ab Dilution: 16552 U/mL (ref <0.8)

## 2020-12-08 ENCOUNTER — Telehealth: Payer: Self-pay | Admitting: Internal Medicine

## 2020-12-08 NOTE — Telephone Encounter (Signed)
Left message to return call.  Completed physical form placed up front for pick up

## 2020-12-11 ENCOUNTER — Other Ambulatory Visit: Payer: Self-pay

## 2020-12-11 ENCOUNTER — Encounter: Payer: Self-pay | Admitting: Family

## 2020-12-11 ENCOUNTER — Ambulatory Visit (INDEPENDENT_AMBULATORY_CARE_PROVIDER_SITE_OTHER): Payer: Managed Care, Other (non HMO)

## 2020-12-11 ENCOUNTER — Ambulatory Visit (INDEPENDENT_AMBULATORY_CARE_PROVIDER_SITE_OTHER): Payer: Managed Care, Other (non HMO) | Admitting: Family

## 2020-12-11 VITALS — BP 122/80 | HR 72 | Ht 65.5 in | Wt 180.8 lb

## 2020-12-11 DIAGNOSIS — F419 Anxiety disorder, unspecified: Secondary | ICD-10-CM | POA: Diagnosis not present

## 2020-12-11 DIAGNOSIS — I10 Essential (primary) hypertension: Secondary | ICD-10-CM

## 2020-12-11 DIAGNOSIS — R1030 Lower abdominal pain, unspecified: Secondary | ICD-10-CM | POA: Diagnosis not present

## 2020-12-11 DIAGNOSIS — Z0000 Encounter for general adult medical examination without abnormal findings: Secondary | ICD-10-CM | POA: Diagnosis not present

## 2020-12-11 DIAGNOSIS — E785 Hyperlipidemia, unspecified: Secondary | ICD-10-CM | POA: Diagnosis not present

## 2020-12-11 MED ORDER — ROSUVASTATIN CALCIUM 10 MG PO TABS: ORAL_TABLET | ORAL | 3 refills | Status: DC

## 2020-12-11 MED ORDER — FLUCONAZOLE 150 MG PO TABS: 150.0000 mg | ORAL_TABLET | Freq: Once | ORAL | 1 refills | Status: AC

## 2020-12-11 MED ORDER — MELOXICAM 7.5 MG PO TABS: 7.5000 mg | ORAL_TABLET | Freq: Every day | ORAL | 1 refills | Status: DC | PRN

## 2020-12-11 NOTE — Assessment & Plan Note (Addendum)
Uncontrolled. Restart crestor once per week and gradually move to daily while monitoring for HA.

## 2020-12-11 NOTE — Assessment & Plan Note (Addendum)
Uncontrolled. Not sleeping well. Restart zoloft. Close follow up.

## 2020-12-11 NOTE — Progress Notes (Signed)
Subjective:    Patient ID: Jaclyn Lin, female    DOB: May 17, 1966, 55 y.o.   MRN: [id number]  CC: Jaclyn Lin is a 55 y.o. female who presents today for physical exam and follow up.    HPI: Complains of leg heaviness in bilateral legs, 2-3 months, unchanged.  She doesn't notice symptom during the day. 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 No low back pain, discoloration of legs, pain in legs with walking, saddle anesthesia, changes in bowel habits, abdominal bloating.  No falls.   Endorses ache in bilateral groin for 2-3 months as well.   No leg swelling, leg pain, or leg numbness. Takes tylenol PM with slight relief.   No h/o GIB.   She discussed symptom with colleague 3 weeks ago whom referred her to neurology, ordered DG lumbar  Numbness and tingling in both hands has improved. She feels it in hands only and only in the morning. No discoloration of skin, no joint swelling.    She also complains of thick vaginal discharge and concern for yeast infection. No dysuria, flank pain or fever  HLD- she is not taking crestor 64m as wasn't sure if caused HA, since resolved.   HTN- Compliant with Dyazide 37.5- 227m No cp.   She is not on the zoloft. She is not sleeping well due to anxiety. Endorses fatigue.  She is not using xanax 0.25102mno si/hi       Colorectal Cancer Screening: due Breast Cancer Screening: Mammogram due Cervical Cancer Screening: UTD, 8 months ago of vagina. She doesn't have cervix.    Lung Cancer Screening: qualifies         Tetanus - utd         Labs: Screening labs today. Exercise: Gets regular exercise.   Alcohol use:  Drinking 6 pack beer on weekend Smoking/tobacco use: smoker, 2- 3 cigarettes per day   HISTORY:  Past Medical History:  Diagnosis Date   Asthma    childhood   Hypertension    UTI (lower urinary tract infection)     Past Surgical History:   Procedure Laterality Date   ABDOMINAL HYSTERECTOMY     partial, Dr. 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     VAGINAL DELIVERY     2   Family History  Problem Relation Age of Onset   Hypertension Mother    Cancer Maternal Grandfather        bone   Asthma Maternal Grandmother    Brain cancer Maternal Grandmother    Asthma Son       ALLERGIES: Codeine  Current Outpatient Medications on File Prior to Visit  Medication Sig Dispense Refill   albuterol (PROVENTIL HFA;VENTOLIN HFA) 108 (90 Base) MCG/ACT inhaler Inhale 2 puffs into the lungs every 6 (six) hours as needed for wheezing or shortness of breath. 1 Inhaler 1   ALPRAZolam (XANAX) 0.25 MG tablet Take 1 tablet (0.25 mg total) by mouth daily as needed for anxiety or sleep. 30 tablet 1   budesonide-formoterol (SYMBICORT) 80-4.5 MCG/ACT inhaler Inhale 2 puffs into the lungs 2 (two) times daily. 1 each 2   diclofenac Sodium (VOLTAREN) 1 % GEL Apply 4 g topically 4 (four) times daily. 50 g 1   gentamicin cream (GARAMYCIN) 0.1 % Apply 1 application topically 2 (two) times daily. 30 g 1   triamterene-hydrochlorothiazide (DYAZIDE) 37.5-25 MG capsule TAKE 1  CAPSULE BY MOUTH EVERY DAY 90 capsule 1   benzonatate (TESSALON) 100 MG capsule Take 1 capsule (100 mg total) by mouth 3 (three) times daily as needed for cough. (Patient not taking: Reported on 12/11/2020) 20 capsule 1   FLOWFLEX COVID-19 AG HOME TEST KIT TEST AS DIRECTED TODAY (Patient not taking: Reported on 12/11/2020)     montelukast (SINGULAIR) 10 MG tablet Take 1 tablet (10 mg total) by mouth 1 day or 1 dose for 1 dose. 90 tablet 3   oxyCODONE-acetaminophen (PERCOCET) 5-325 MG tablet Take 1 tablet by mouth every 4 (four) hours as needed for severe pain. (Patient not taking: Reported on 12/11/2020) 30 tablet 0   sertraline (ZOLOFT) 50 MG tablet Take 1 tablet by mouth daily. (Patient not taking: Reported on 12/11/2020)     No current  facility-administered medications on file prior to visit.    Social History   Tobacco Use   Smoking status: Every Day    Packs/day: 0.50    Years: 20.00    Pack years: 10.00    Types: Cigarettes    Start date: 02/11/2016   Smokeless tobacco: Never   Tobacco comments:    smoking every couple of days  Vaping Use   Vaping Use: Never used  Substance Use Topics   Alcohol use: Yes    Comment: Beer on weekends; 6 pack   Drug use: No    Review of Systems  Constitutional:  Positive for fatigue. Negative for chills, fever and unexpected weight change.  HENT:  Negative for congestion.   Respiratory:  Negative for cough.   Cardiovascular:  Negative for chest pain, palpitations and leg swelling.  Gastrointestinal:  Negative for nausea and vomiting.  Genitourinary:  Positive for vaginal discharge. Negative for dysuria.  Musculoskeletal:  Negative for arthralgias, back pain, joint swelling and myalgias.  Skin:  Negative for rash.  Neurological:  Negative for headaches.  Hematological:  Negative for adenopathy.  Psychiatric/Behavioral:  Positive for sleep disturbance. Negative for confusion and suicidal ideas. The patient is nervous/anxious.      Objective:    BP 122/80 (BP Location: Left Arm, Patient Position: Sitting, Cuff Size: Large)   Pulse 72   Ht 5' 5.5" (1.664 m)   Wt 180 lb 12.8 oz (82 kg)   SpO2 99%   BMI 29.63 kg/m   BP Readings from Last 3 Encounters:  12/11/20 122/80  11/30/20 120/80  04/28/20 126/79   Wt Readings from Last 3 Encounters:  12/11/20 180 lb 12.8 oz (82 kg)  11/30/20 180 lb (81.6 kg)  08/11/20 178 lb (80.7 kg)    Physical Exam Vitals reviewed.  Constitutional:      Appearance: Normal appearance. She is well-developed.  Eyes:     Conjunctiva/sclera: Conjunctivae normal.  Neck:     Thyroid: No thyroid mass or thyromegaly.  Cardiovascular:     Rate and Rhythm: Normal rate and regular rhythm.     Pulses: Normal pulses.     Heart sounds: Normal  heart sounds.  Pulmonary:     Effort: Pulmonary effort is normal.     Breath sounds: Normal breath sounds. No wheezing, rhonchi or rales.  Chest:  Breasts:    Breasts are symmetrical.     Right: No inverted nipple, mass, nipple discharge, skin change or tenderness.     Left: No inverted nipple, mass, nipple discharge, skin change or tenderness.  Abdominal:     General: Bowel sounds are normal. There is no distension.  Palpations: Abdomen is soft. Abdomen is not rigid. There is no fluid wave or mass.     Tenderness: There is no abdominal tenderness. There is no right CVA tenderness, left CVA tenderness, guarding or rebound.  Musculoskeletal:     Comments: Negative single leg raise bilaterally Bilateral hips No limp or waddling gait. Full ROM with flexion and hip rotation in flexion.    No pain of lateral hip with  (flexion-abduction-external rotation) test.  No pain with deep palpation of greater trochanter.     Lymphadenopathy:     Head:     Right side of head: No submental, submandibular, tonsillar, preauricular, posterior auricular or occipital adenopathy.     Left side of head: No submental, submandibular, tonsillar, preauricular, posterior auricular or occipital adenopathy.     Cervical: No cervical adenopathy.     Right cervical: No superficial, deep or posterior cervical adenopathy.    Left cervical: No superficial, deep or posterior cervical adenopathy.  Skin:    General: Skin is warm and dry.  Neurological:     Mental Status: She is alert.  Psychiatric:        Speech: Speech normal.        Behavior: Behavior normal.        Thought Content: Thought content normal.       Assessment & Plan:   Problem List Items Addressed This Visit       Cardiovascular and Mediastinum   Hypertension    Controlled. Continue dyazide 37.5- 66m         Other   Anxiety    Uncontrolled. Not sleeping well. Restart zoloft. Close follow up.        Groin pain    Symptom most  consistent with hip or lumbar etiology. Autoimmune labs normal 8 months ago.  Pending XR.  Pending urine analysis, urine culture in setting of hematuria. Discussed referral to PT. Also consider GI, vascular work up if xray unrevealing. Await referral to neurology as well.         Relevant Medications   meloxicam (MOBIC) 7.5 MG tablet   fluconazole (DIFLUCAN) 150 MG tablet   Other Relevant Orders   DG HIPS BILAT WITH PELVIS 3-4 VIEWS   Urinalysis, Routine w reflex microscopic   Urine Culture   HLD (hyperlipidemia)    Uncontrolled. Restart crestor once per week and gradually move to daily while monitoring for HA.        Routine general medical examination at a health care facility - Primary    CBE performed. Deferred pelvic exam as patient doesn't have cervix. Ordered CT lung cancer screen. Mammogram scheduled.        Relevant Orders   CT CHEST LUNG CANCER SCREENING LOW DOSE WO CONTRAST     I have discontinued ADebara Pickett Lisowski's meloxicam. I am also having her start on meloxicam and fluconazole. Additionally, I am having her maintain her albuterol, ALPRAZolam, diclofenac Sodium, montelukast, oxyCODONE-acetaminophen, gentamicin cream, benzonatate, budesonide-formoterol, triamterene-hydrochlorothiazide, Flowflex COVID-19 Ag Home Test, and sertraline.   Meds ordered this encounter  Medications   meloxicam (MOBIC) 7.5 MG tablet    Sig: Take 1 tablet (7.5 mg total) by mouth daily as needed for pain.    Dispense:  30 tablet    Refill:  1    Order Specific Question:   Supervising Provider    Answer:   TDeborra MedinaL [[id number]]   fluconazole (DIFLUCAN) 150 MG tablet    Sig: Take 1 tablet (150 mg total) by  mouth once for 1 dose. Take one tablet PO once. If sxs persist, may take one tablet PO 3 days later.    Dispense:  2 tablet    Refill:  1    Order Specific Question:   Supervising Provider    Answer:   Crecencio Mc [[id number]]    Return precautions given.   Risks, benefits, and  alternatives of the medications and treatment plan prescribed today were discussed, and patient expressed understanding.   Education regarding symptom management and diagnosis given to patient on AVS.   Continue to follow with Burnard Hawthorne, FNP for routine health maintenance.   Jaclyn Lin and I agreed with plan.   Mable Paris, FNP

## 2020-12-11 NOTE — Assessment & Plan Note (Signed)
CBE performed. Deferred pelvic exam as patient doesn't have cervix. Ordered CT lung cancer screen. Mammogram scheduled.

## 2020-12-11 NOTE — Assessment & Plan Note (Addendum)
Symptom most consistent with hip or lumbar etiology. Autoimmune labs normal 8 months ago.  Pending XR.  Pending urine analysis, urine culture in setting of hematuria. Discussed referral to PT. Also consider GI, vascular work up if xray unrevealing. Await referral to neurology as well.

## 2020-12-11 NOTE — Patient Instructions (Signed)
Restart crestor, zoloft May use mobic as needed Referral for CT lung cancer screen Mammogram scheduled   Nice to see you!

## 2020-12-11 NOTE — Assessment & Plan Note (Addendum)
Controlled. Continue dyazide 37.5- 25mg 

## 2020-12-12 ENCOUNTER — Telehealth: Payer: Self-pay | Admitting: Family

## 2020-12-12 LAB — URINE CULTURE
MICRO NUMBER:: 12026964
Result:: NO GROWTH
SPECIMEN QUALITY:: ADEQUATE

## 2020-12-12 NOTE — Telephone Encounter (Signed)
Lft vm for pt to call ofc to follow up on referral to neurology. thanks

## 2020-12-18 ENCOUNTER — Other Ambulatory Visit: Payer: Self-pay

## 2020-12-18 ENCOUNTER — Telehealth: Payer: Self-pay

## 2020-12-18 DIAGNOSIS — R1030 Lower abdominal pain, unspecified: Secondary | ICD-10-CM

## 2020-12-18 NOTE — Telephone Encounter (Signed)
LMTCB for xray results.  °

## 2020-12-20 ENCOUNTER — Telehealth: Payer: Self-pay | Admitting: *Deleted

## 2020-12-20 ENCOUNTER — Encounter: Payer: Self-pay | Admitting: *Deleted

## 2020-12-20 DIAGNOSIS — F17200 Nicotine dependence, unspecified, uncomplicated: Secondary | ICD-10-CM

## 2020-12-20 DIAGNOSIS — Z87891 Personal history of nicotine dependence: Secondary | ICD-10-CM

## 2020-12-20 DIAGNOSIS — Z122 Encounter for screening for malignant neoplasm of respiratory organs: Secondary | ICD-10-CM

## 2020-12-20 NOTE — Telephone Encounter (Signed)
Received referral for initial lung cancer screening scan. Contacted patient and obtained smoking history,(current smoker, 29 pack yrs ) as well as answering questions related to screening process. Patient denies signs of lung cancer such as weight loss or hemoptysis. Patient denies comorbidity that would prevent curative treatment if lung cancer were found. Patient is scheduled for shared decision making visit and CT scan on 01/16/21@2 :00pm.

## 2020-12-29 ENCOUNTER — Other Ambulatory Visit (INDEPENDENT_AMBULATORY_CARE_PROVIDER_SITE_OTHER): Payer: Managed Care, Other (non HMO)

## 2020-12-29 ENCOUNTER — Other Ambulatory Visit: Payer: Self-pay

## 2020-12-29 DIAGNOSIS — R1030 Lower abdominal pain, unspecified: Secondary | ICD-10-CM | POA: Diagnosis not present

## 2020-12-29 NOTE — Addendum Note (Signed)
Addended by: Dulcy Sida S on: 12/29/2020 02:22 PM   Modules accepted: Orders  

## 2020-12-30 LAB — URINALYSIS, ROUTINE W REFLEX MICROSCOPIC
Bacteria, UA: NONE SEEN /HPF
Bilirubin Urine: NEGATIVE
Glucose, UA: NEGATIVE
Hyaline Cast: NONE SEEN /LPF
Ketones, ur: NEGATIVE
Leukocytes,Ua: NEGATIVE
Nitrite: NEGATIVE
Protein, ur: NEGATIVE
Specific Gravity, Urine: 1.016 (ref 1.001–1.035)
Squamous Epithelial / HPF: NONE SEEN /HPF (ref <=5)
WBC, UA: NONE SEEN /HPF (ref 0–5)
pH: 5.5 (ref 5.0–8.0)

## 2020-12-30 LAB — MICROSCOPIC MESSAGE

## 2021-01-01 NOTE — Telephone Encounter (Signed)
Pt scheduled with Dr. Sherryll Burger for 02/20/21.

## 2021-01-01 NOTE — Telephone Encounter (Signed)
-----   Message from Karlyne Greenspan sent at 12/21/2020  2:44 PM EDT ----- I called [facility] today and was advised that pt will be called to sch.  ----- Message ----- From: Allegra Grana, FNP Sent: 12/11/2020   8:46 AM EDT To: Karlyne Greenspan  Status of neurology referral?

## 2021-01-01 NOTE — Telephone Encounter (Signed)
Call pt  Appears [facility]  has reached out to schedule appt for numbness and tingling in legs.  Has she made an appt?

## 2021-01-16 ENCOUNTER — Ambulatory Visit: Admission: RE | Admit: 2021-01-16 | Payer: Managed Care, Other (non HMO) | Source: Ambulatory Visit

## 2021-01-16 ENCOUNTER — Inpatient Hospital Stay: Payer: Managed Care, Other (non HMO) | Attending: Nurse Practitioner | Admitting: Nurse Practitioner

## 2021-02-09 ENCOUNTER — Other Ambulatory Visit: Payer: Self-pay | Admitting: Family

## 2021-02-20 ENCOUNTER — Encounter: Payer: Self-pay | Admitting: Family

## 2021-04-01 ENCOUNTER — Other Ambulatory Visit: Payer: Self-pay | Admitting: Family

## 2021-04-01 DIAGNOSIS — R1030 Lower abdominal pain, unspecified: Secondary | ICD-10-CM

## 2021-04-16 ENCOUNTER — Other Ambulatory Visit: Payer: Self-pay | Admitting: Family

## 2021-04-16 DIAGNOSIS — J309 Allergic rhinitis, unspecified: Secondary | ICD-10-CM

## 2021-04-16 DIAGNOSIS — I10 Essential (primary) hypertension: Secondary | ICD-10-CM

## 2021-04-16 DIAGNOSIS — J45909 Unspecified asthma, uncomplicated: Secondary | ICD-10-CM

## 2021-04-17 ENCOUNTER — Other Ambulatory Visit: Payer: Self-pay | Admitting: Family

## 2021-04-17 DIAGNOSIS — J45909 Unspecified asthma, uncomplicated: Secondary | ICD-10-CM

## 2021-05-14 ENCOUNTER — Other Ambulatory Visit: Payer: Self-pay | Admitting: Podiatry

## 2021-10-06 ENCOUNTER — Other Ambulatory Visit: Payer: Self-pay | Admitting: Family

## 2021-10-06 DIAGNOSIS — I10 Essential (primary) hypertension: Secondary | ICD-10-CM

## 2022-02-01 ENCOUNTER — Ambulatory Visit (INDEPENDENT_AMBULATORY_CARE_PROVIDER_SITE_OTHER): Payer: Managed Care, Other (non HMO)

## 2022-02-01 ENCOUNTER — Other Ambulatory Visit (HOSPITAL_COMMUNITY)
Admission: RE | Admit: 2022-02-01 | Discharge: 2022-02-01 | Disposition: A | Discharge status: Home / Self Care | Payer: Managed Care, Other (non HMO) | Source: Ambulatory Visit | Attending: Family | Admitting: Family

## 2022-02-01 ENCOUNTER — Encounter: Payer: Self-pay | Admitting: Family

## 2022-02-01 ENCOUNTER — Ambulatory Visit (INDEPENDENT_AMBULATORY_CARE_PROVIDER_SITE_OTHER): Payer: Managed Care, Other (non HMO) | Admitting: Family

## 2022-02-01 VITALS — BP 134/82 | HR 83 | Temp 97.8°F | Ht 66.0 in | Wt 187.6 lb

## 2022-02-01 DIAGNOSIS — R102 Pelvic and perineal pain: Secondary | ICD-10-CM

## 2022-02-01 DIAGNOSIS — Z1211 Encounter for screening for malignant neoplasm of colon: Secondary | ICD-10-CM | POA: Diagnosis not present

## 2022-02-01 DIAGNOSIS — Z0000 Encounter for general adult medical examination without abnormal findings: Secondary | ICD-10-CM

## 2022-02-01 LAB — URINALYSIS, ROUTINE W REFLEX MICROSCOPIC
Bilirubin Urine: NEGATIVE
Ketones, ur: NEGATIVE
Leukocytes,Ua: NEGATIVE
Nitrite: NEGATIVE
Specific Gravity, Urine: 1.01 (ref 1.000–1.030)
Total Protein, Urine: NEGATIVE
Urine Glucose: NEGATIVE
Urobilinogen, UA: 0.2 (ref 0.0–1.0)
pH: 7 (ref 5.0–8.0)

## 2022-02-01 LAB — HEMOGLOBIN A1C: Hgb A1c MFr Bld: 6.9 % — ABNORMAL HIGH (ref 4.6–6.5)

## 2022-02-01 LAB — VITAMIN D 25 HYDROXY (VIT D DEFICIENCY, FRACTURES): VITD: 29.04 ng/mL — ABNORMAL LOW (ref 30.00–100.00)

## 2022-02-01 LAB — LIPID PANEL
Cholesterol: 241 mg/dL — ABNORMAL HIGH (ref 0–200)
HDL: 51.7 mg/dL (ref >39.00)
LDL Cholesterol: 161 mg/dL — ABNORMAL HIGH (ref 0–99)
NonHDL: 189.12
Total CHOL/HDL Ratio: 5
Triglycerides: 139 mg/dL (ref 0.0–149.0)
VLDL: 27.8 mg/dL (ref 0.0–40.0)

## 2022-02-01 LAB — COMPREHENSIVE METABOLIC PANEL
ALT: 14 U/L (ref 0–35)
AST: 15 U/L (ref 0–37)
Albumin: 4.6 g/dL (ref 3.5–5.2)
Alkaline Phosphatase: 64 U/L (ref 39–117)
BUN: 10 mg/dL (ref 6–23)
CO2: 28 mEq/L (ref 19–32)
Calcium: 10.1 mg/dL (ref 8.4–10.5)
Chloride: 103 mEq/L (ref 96–112)
Creatinine, Ser: 0.77 mg/dL (ref 0.40–1.20)
GFR: 86.13 mL/min (ref >60.00)
Glucose, Bld: 113 mg/dL — ABNORMAL HIGH (ref 70–99)
Potassium: 3.8 mEq/L (ref 3.5–5.1)
Sodium: 141 mEq/L (ref 135–145)
Total Bilirubin: 0.4 mg/dL (ref 0.2–1.2)
Total Protein: 7.4 g/dL (ref 6.0–8.3)

## 2022-02-01 LAB — CBC WITH DIFFERENTIAL/PLATELET
Basophils Absolute: 0.1 10*3/uL (ref 0.0–0.1)
Basophils Relative: 1.2 % (ref 0.0–3.0)
Eosinophils Absolute: 0.2 10*3/uL (ref 0.0–0.7)
Eosinophils Relative: 2.7 % (ref 0.0–5.0)
HCT: 39.1 % (ref 36.0–46.0)
Hemoglobin: 12.9 g/dL (ref 12.0–15.0)
Lymphocytes Relative: 31 % (ref 12.0–46.0)
Lymphs Abs: 2.2 10*3/uL (ref 0.7–4.0)
MCHC: 33 g/dL (ref 30.0–36.0)
MCV: 81.6 fl (ref 78.0–100.0)
Monocytes Absolute: 0.4 10*3/uL (ref 0.1–1.0)
Monocytes Relative: 5.7 % (ref 3.0–12.0)
Neutro Abs: 4.3 10*3/uL (ref 1.4–7.7)
Neutrophils Relative %: 59.4 % (ref 43.0–77.0)
Platelets: 277 10*3/uL (ref 150.0–400.0)
RBC: 4.8 Mil/uL (ref 3.87–5.11)
RDW: 14.9 % (ref 11.5–15.5)
WBC: 7.2 10*3/uL (ref 4.0–10.5)

## 2022-02-01 LAB — TSH: TSH: 1.19 u[IU]/mL (ref 0.35–5.50)

## 2022-02-01 MED ORDER — ROSUVASTATIN CALCIUM 10 MG PO TABS: ORAL_TABLET | ORAL | 3 refills | Status: DC

## 2022-02-01 MED ORDER — SERTRALINE HCL 50 MG PO TABS: ORAL_TABLET | ORAL | 3 refills | Status: DC

## 2022-02-01 NOTE — Progress Notes (Signed)
Subjective:    Patient ID: Jaclyn Lin, female    DOB: [birth year], 56 y.o.   MRN: [id number]  CC: Jaclyn Lin is a 55 y.o. female who presents today for physical exam.    HPI: She complains of 'ache in uterus' and groin area for one year,  Occurs only at night. Endorses abdominal bloating.  She has gained weight. She has been using cator oil with some relief.  No fever, dysruai, urinary frequency, unusual weight loss, numbness, saddle anesthesia, constipation, changes in bowel habits, vaginal bleeding, leg swelling.   No sexual partners  posterior back pain 'is every now and again'.   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 Vernie Lin. 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 Noted: 03/29/20 No cervix on exam today.   Bone Health screening/DEXA for 65+: No increased fracture risk. Defer screening at this time.  Lung Cancer Screening:meets criteria        Tetanus - UTD          Labs: Screening labs today. Exercise: Gets regular exercise.   Alcohol use:  occassional Smoking/tobacco use: smoker.  Started smoking at 56 yo. Smokes a pack per day.    HISTORY:  Past Medical History:  Diagnosis Date  . Asthma    childhood  . Hypertension   . UTI (lower urinary tract infection)     Past Surgical History:  Procedure Laterality Date  . ABDOMINAL HYSTERECTOMY     partial, Dr. 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    Marland Kitchen VAGINAL DELIVERY     2   Family History  Problem Relation Age of Onset  . Hypertension Mother   . Cancer Maternal Grandfather        bone  . Asthma Maternal Grandmother   . Brain cancer Maternal Grandmother   . Asthma Son       ALLERGIES: Codeine  Current Outpatient Medications on File Prior to Visit  Medication Sig Dispense Refill  . albuterol  (PROVENTIL HFA;VENTOLIN HFA) 108 (90 Base) MCG/ACT inhaler Inhale 2 puffs into the lungs every 6 (six) hours as needed for wheezing or shortness of breath. 1 Inhaler 1  . ALPRAZolam (XANAX) 0.25 MG tablet Take 1 tablet (0.25 mg total) by mouth daily as needed for anxiety or sleep. 30 tablet 1  . budesonide-formoterol (SYMBICORT) 80-4.5 MCG/ACT inhaler Inhale 2 puffs into the lungs 2 (two) times daily. 1 each 2  . FLOWFLEX COVID-19 AG HOME TEST KIT     . montelukast (SINGULAIR) 10 MG tablet TAKE 1 TABLET BY MOUTH EVERY DAY 90 tablet 3  . montelukast (SINGULAIR) 10 MG tablet TAKE 1 TABLET BY MOUTH EVERY DAY 90 tablet 3  . triamterene-hydrochlorothiazide (DYAZIDE) 37.5-25 MG capsule TAKE 1 CAPSULE BY MOUTH EVERY DAY 90 capsule 1  . benzonatate (TESSALON) 100 MG capsule Take 1 capsule (100 mg total) by mouth 3 (three) times daily as needed for cough. (Patient not taking: Reported on 02/01/2022) 20 capsule 1  . diclofenac Sodium (VOLTAREN) 1 % GEL Apply 4 g topically 4 (four) times daily. (Patient not taking: Reported on 02/01/2022) 50 g 1  . gentamicin cream (GARAMYCIN) 0.1 % Apply 1 application topically 2 (two) times daily. (Patient not taking: Reported on 02/01/2022) 30 g 1  . meloxicam (MOBIC) 7.5 MG tablet TAKE 1 TABLET(7.5 MG) BY MOUTH DAILY AS NEEDED FOR PAIN (Patient  not taking: Reported on 02/01/2022) 30 tablet 1  . oxyCODONE-acetaminophen (PERCOCET) 5-325 MG tablet Take 1 tablet by mouth every 4 (four) hours as needed for severe pain. (Patient not taking: Reported on 02/01/2022) 30 tablet 0  . rosuvastatin (CRESTOR) 10 MG tablet Take one tablet by mouth every evening. (Patient not taking: Reported on 02/01/2022) 90 tablet 3  . sertraline (ZOLOFT) 50 MG tablet TAKE 1 TABLET(50 MG) BY MOUTH AT BEDTIME (Patient not taking: Reported on 02/01/2022) 90 tablet 1   No current facility-administered medications on file prior to visit.    Social History   Tobacco Use  . Smoking status: Every Day     Packs/day: 0.75    Years: 39.00    Total pack years: 29.25    Types: Cigarettes    Start date: 02/11/2016  . Smokeless tobacco: Never  . Tobacco comments:    smoking every couple of days  Vaping Use  . Vaping Use: Never used  Substance Use Topics  . Alcohol use: Yes    Comment: Beer on weekends; 6 pack  . Drug use: No    Review of Systems  Constitutional:  Negative for chills, fever and unexpected weight change.  HENT:  Negative for congestion.   Respiratory:  Negative for cough.   Cardiovascular:  Negative for chest pain, palpitations and leg swelling.  Gastrointestinal:  Negative for nausea and vomiting.  Musculoskeletal:  Negative for arthralgias and myalgias.  Skin:  Negative for rash.  Neurological:  Negative for headaches.  Hematological:  Negative for adenopathy.  Psychiatric/Behavioral:  Negative for confusion.       Objective:    BP 134/82 (BP Location: Left Arm, Patient Position: Sitting, Cuff Size: Normal)   Pulse 83   Temp 97.8 F (36.6 C) (Oral)   Ht '5\' 6"'  (1.676 m)   Wt 187 lb 9.6 oz (85.1 kg)   BMI 30.28 kg/m   BP Readings from Last 3 Encounters:  02/01/22 134/82  12/11/20 122/80  11/30/20 120/80   Wt Readings from Last 3 Encounters:  02/01/22 187 lb 9.6 oz (85.1 kg)  12/11/20 180 lb 12.8 oz (82 kg)  11/30/20 180 lb (81.6 kg)    Physical Exam     Assessment & Plan:   Problem List Items Addressed This Visit   None    I am having Jaclyn Lin. Jaclyn Lin maintain her albuterol, ALPRAZolam, diclofenac Sodium, oxyCODONE-acetaminophen, gentamicin cream, benzonatate, budesonide-formoterol, Flowflex COVID-19 Ag Home Test, rosuvastatin, sertraline, meloxicam, montelukast, montelukast, and triamterene-hydrochlorothiazide.   No orders of the defined types were placed in this encounter.   Return precautions given.   Risks, benefits, and alternatives of the medications and treatment plan prescribed today were discussed, and patient expressed  understanding.   Education regarding symptom management and diagnosis given to patient on AVS.   Continue to follow with Jaclyn Hawthorne, FNP for routine health maintenance.   Jaclyn Lin and I agreed with plan.   Jaclyn Paris, FNP

## 2022-02-01 NOTE — Progress Notes (Unsigned)
Not due for pap but patient wants you to check because she has been having some pain in vaginal area

## 2022-02-01 NOTE — Patient Instructions (Signed)
I have placed a referral to gastroenterology for colonoscopy.  I also placed a referral to [facility] pulmonology he can start this lung cancer screening program.  This is an annual program. Let us know if you dont hear back within a week in regards to an appointment being scheduled.    Please download Myfitness Pal App ( free). You may log every thing you eat for even 2-3 days to get a better of idea of true daily calories. To loose weight, you have to use more calories than than consumed and essentially create caloric deficit to loose weight. The goal is 1-2 lbs per week of weight loss.   You review below from [facility].   https://[email or web address].it  Calorie counting made easy  Eat less, exercise more. If only it were that simple! As most dieters know, losing weight can be very challenging. As this report details, a range of influences can affect how people gain and lose weight. But a basic understanding of how to tip your energy balance in favor of weight loss is a good place to start.  Start by determining how many calories you should consume each day. To do so, you need to know how many calories you need to maintain your current weight. Doing this requires a few simple calculations.  First, multiply your current weight by 15 -- that's roughly the number of calories per pound of body weight needed to maintain your current weight if you are moderately active. Moderately active means getting at least 30 minutes of physical activity a day in the form of exercise (walking at a brisk pace, climbing stairs, or active gardening). Let's say you're a woman who is 5 feet, 4 inches tall and weighs 155 pounds, and you need to lose about 15 pounds to put you in a healthy weight range. If you multiply 155 by 15, you will get 2,325, which is the number of calories per day that you need in order to maintain your current weight (weight-maintenance calories). To  lose weight, you will need to get below that total.  For example, to lose 1 to 2 pounds a week -- a rate that experts consider safe -- your food consumption should provide 500 to 1,000 calories less than your total weight-maintenance calories. If you need 2,325 calories a day to maintain your current weight, reduce your daily calories to between 1,325 and 1,825. If you are sedentary, you will also need to build more activity into your day. In order to lose at least a pound a week, try to do at least 30 minutes of physical activity on most days, and reduce your daily calorie intake by at least 500 calories. However, calorie intake should not fall below 1,200 a day in women or 1,500 a day in men, except under the supervision of a health professional. Eating too few calories can endanger your health by depriving you of needed nutrients.  Meeting your calorie target How can you meet your daily calorie target? One approach is to add up the number of calories per serving of all the foods that you eat, and then plan your menus accordingly. You can buy books that list calories per serving for many foods. In addition, the nutrition labels on all packaged foods and beverages provide calories per serving information. Make a point of reading the labels of the foods and drinks you use, noting the number of calories and the serving sizes. Many recipes published in cookbooks, newspapers, and magazines provide similar information.  If you hate counting calories, a different approach is to restrict how much and how often you eat, and to eat meals that are low in calories. Dietary guidelines issued by the American Heart Association stress common sense in choosing your foods rather than focusing strictly on numbers, such as total calories or calories from fat. Whichever method you choose, research shows that a regular eating schedule -- with meals and snacks planned for certain times each day -- makes for the most successful  approach. The same applies after you have lost weight and want to keep it off. Sticking with an eating schedule increases your chance of maintaining your new weight.    This is  Jaclyn Lin  ( an amazing physician in my office!)  example of a  "Low GI"  Diet:  It will allow you to lose 4 to 8  lbs  per month if you follow it carefully.  Your goal with exercise is a minimum of 30 minutes of aerobic exercise 5 days per week (Walking does not count once it becomes easy!)    All of the foods can be found at grocery stores and in bulk at [employer].  The Atkins protein bars and shakes are available in more varieties at Target, WalMart and [employer]'s Foods.     7 AM Breakfast:  Choose from the following:  Low carbohydrate Protein  Shakes (I recommend the  Premier Protein chocolate shakes,  EAS AdvantEdge "Carb Control" shakes  Or the Atkins shakes all are under 3 net carbs)     a scrambled egg/bacon/cheese burrito made with Mission's "carb balance" whole wheat tortilla  (about 10 net carbs )  Medical laboratory scientific officer (basically a quiche without the pastry crust) that is eaten cold and very convenient way to get your eggs.  8 carbs)  If you make your own protein shakes, avoid bananas and pineapple,  And use low carb greek yogurt or original /unsweetened almond or soy milk    Avoid cereal and bananas, oatmeal and cream of wheat and grits. They are loaded with carbohydrates!   10 AM: high protein snack:  Protein bar by Atkins (the snack size, under 200 cal, usually < 6 net carbs).    A stick of cheese:  Around 1 carb,  100 cal     Dannon Light n Fit [place] Yogurt  (80 cal, 8 carbs)  Other so called "protein bars" and Greek yogurts tend to be loaded with carbohydrates.  Remember, in food advertising, the word "energy" is synonymous for " carbohydrate."  Lunch:   A Sandwich using the bread choices listed, Can use any  Eggs,  lunchmeat, grilled meat or canned tuna), avocado, regular  mayo/mustard  and cheese.  A Salad using blue cheese, ranch,  Goddess or vinagrette,  Avoid taco shells, croutons or "confetti" and no "candied nuts" but regular nuts OK.   No pretzels, nabs  or chips.  Pickles and miniature sweet peppers are a good low carb alternative that provide a "crunch"  The bread is the only source of carbohydrate in a sandwich and  can be decreased by trying some of the attached alternatives to traditional loaf bread   Avoid "Low fat dressings, as well as Reyne Dumas and [employer] dressings They are loaded with sugar!   3 PM/ Mid day  Snack:  Consider  1 ounce of  almonds, walnuts, pistachios, pecans, peanuts,  Macadamia nuts or a nut medley.  Avoid "granola and granola bars "  Mixed nuts are ok in moderation as long as there are no raisins,  cranberries or dried fruit.   KIND bars are OK if you get the low glycemic index variety   Try the prosciutto/mozzarella cheese sticks by F[employer]  In deli /backery section   High protein      6 PM  Dinner:     Meat/fowl/fish with a green salad, and either broccoli, cauliflower, green beans, spinach, brussel sprouts or  Lima beans. DO NOT BREAD THE PROTEIN!!      There is a low carb pasta by Dreamfield's that is acceptable and tastes great: only 5 digestible carbs/serving.( All grocery stores but BJs carry it ) Several ready made meals are available low carb:   Try Michel Angelo's chicken piccata or chicken or eggplant parm over low carb pasta.(Lowes and BJs)   Clifton Custard Sanchez's "Carnitas" (pulled pork, no sauce,  0 carbs) or his beef pot roast to make a dinner burrito (at BJ's)  Pesto over low carb pasta (bj's sells a good quality pesto in the center refrigerated section of the deli   Try satueeing  Roosvelt Harps with mushroooms as a good side   [employer] Giant makes a mashed cauliflower that tastes like mashed potatoes  Whole wheat pasta is still full of digestible carbs and  Not as low in glycemic index as Dreamfield's.   Brown  rice is still rice,  So skip the rice and noodles if you eat [place] or [place] (or at least limit to 1/2 cup)  9 PM snack :   Breyer's "low carb" fudgsicle or  ice cream bar (Carb Smart line), or  Weight Watcher's ice cream bar , or another "no sugar added" ice cream;  a serving of fresh berries/cherries with whipped cream   Cheese or DANNON'S LlGHT N FIT GREEK YOGURT  8 ounces of [place] unsweetened almond/cococunut milk    Treat yourself to a parfait made with whipped cream blueberiies, walnuts and vanilla greek yogurt  Avoid bananas, pineapple, grapes  and watermelon on a regular basis because they are high in sugar.  THINK OF THEM AS DESSERT  Remember that snack Substitutions should be less than 10 NET carbs per serving and meals < 20 carbs. Remember to subtract fiber grams to get the "net carbs."

## 2022-02-02 LAB — URINE CULTURE
MICRO NUMBER:: 13767961
Result:: NO GROWTH
SPECIMEN QUALITY:: ADEQUATE

## 2022-02-04 LAB — CYTOLOGY - PAP
Adequacy: ABSENT
Comment: NEGATIVE
Diagnosis: NEGATIVE
High risk HPV: NEGATIVE

## 2022-02-04 NOTE — Assessment & Plan Note (Addendum)
Transvaginal ltrasound and urine studies ordered.Lumbar Xr ordered as well as question if symptom GYN versus MS.

## 2022-02-04 NOTE — Assessment & Plan Note (Signed)
Clinical breast exam performed today.  Pelvic exam performed due to complaints today.  No cervix on exam.  Pap collected the vaginal wall.  Referral to gastroenterology for colonoscopy.  Mammogram has been ordered.  She qualifies for CT lung cancer screening program referral and has been placed as well.

## 2022-02-06 ENCOUNTER — Telehealth: Payer: Managed Care, Other (non HMO) | Admitting: Family

## 2022-02-07 ENCOUNTER — Other Ambulatory Visit: Payer: Self-pay

## 2022-02-07 ENCOUNTER — Encounter: Payer: Self-pay | Admitting: Family

## 2022-02-07 ENCOUNTER — Telehealth: Payer: Self-pay

## 2022-02-07 DIAGNOSIS — Z1211 Encounter for screening for malignant neoplasm of colon: Secondary | ICD-10-CM

## 2022-02-07 MED ORDER — NA SULFATE-K SULFATE-MG SULF 17.5-3.13-1.6 GM/177ML PO SOLN: 354.0000 mL | Freq: Once | ORAL | 0 refills | Status: AC

## 2022-02-07 NOTE — Telephone Encounter (Signed)
Gastroenterology Pre-Procedure Review  Request Date: 03/19/2022 Requesting Physician: Dr. Vicente Males  PATIENT REVIEW QUESTIONS: The patient responded to the following health history questions as indicated:    1. Are you having any GI issues? no 2. Do you have a personal history of Polyps? no 3. Do you have a family history of Colon Cancer or Polyps? no 4. Diabetes Mellitus? no 5. Joint replacements in the past 12 months?no 6. Major health problems in the past 3 months?no 7. Any artificial heart valves, MVP, or defibrillator?no    MEDICATIONS & ALLERGIES:    Patient reports the following regarding taking any anticoagulation/antiplatelet therapy:   Plavix, Coumadin, Eliquis, Xarelto, Lovenox, Pradaxa, Brilinta, or Effient? no Aspirin? 26m   Patient confirms/reports the following medications:  Current Outpatient Medications  Medication Sig Dispense Refill   albuterol (PROVENTIL HFA;VENTOLIN HFA) 108 (90 Base) MCG/ACT inhaler Inhale 2 puffs into the lungs every 6 (six) hours as needed for wheezing or shortness of breath. 1 Inhaler 1   ALPRAZolam (XANAX) 0.25 MG tablet Take 1 tablet (0.25 mg total) by mouth daily as needed for anxiety or sleep. 30 tablet 1   budesonide-formoterol (SYMBICORT) 80-4.5 MCG/ACT inhaler Inhale 2 puffs into the lungs 2 (two) times daily. 1 each 2   FLOWFLEX COVID-19 AG HOME TEST KIT      montelukast (SINGULAIR) 10 MG tablet TAKE 1 TABLET BY MOUTH EVERY DAY 90 tablet 3   rosuvastatin (CRESTOR) 10 MG tablet Take one tablet by mouth every evening. 90 tablet 3   sertraline (ZOLOFT) 50 MG tablet TAKE 1 TABLET(50 MG) BY MOUTH AT BEDTIME 90 tablet 3   triamterene-hydrochlorothiazide (DYAZIDE) 37.5-25 MG capsule TAKE 1 CAPSULE BY MOUTH EVERY DAY 90 capsule 1   No current facility-administered medications for this visit.    Patient confirms/reports the following allergies:  Allergies  Allergen Reactions   Codeine Itching    No orders of the defined types were placed  in this encounter.   AUTHORIZATION INFORMATION Primary Insurance: 1D#: Group #:  Secondary Insurance: 1D#: Group #:  SCHEDULE INFORMATION: Date:  Time: Location:

## 2022-02-08 ENCOUNTER — Telehealth: Payer: Self-pay

## 2022-02-08 ENCOUNTER — Ambulatory Visit
Admission: RE | Admit: 2022-02-08 | Discharge: 2022-02-08 | Disposition: A | Discharge status: Home / Self Care | Payer: Managed Care, Other (non HMO) | Source: Ambulatory Visit | Attending: Family

## 2022-02-08 DIAGNOSIS — R102 Pelvic and perineal pain: Secondary | ICD-10-CM | POA: Diagnosis present

## 2022-02-08 NOTE — Telephone Encounter (Signed)
Patient states she would like to email her wellness form to Jenate Swaziland, CMA.  I gave patient the following email address:  [email or web address]

## 2022-02-08 NOTE — Telephone Encounter (Signed)
Spoke to patient and she stated that she would try and pull it back up and fax it back to me. I provided her with the fax number to office

## 2022-02-20 ENCOUNTER — Ambulatory Visit (INDEPENDENT_AMBULATORY_CARE_PROVIDER_SITE_OTHER): Payer: Managed Care, Other (non HMO) | Admitting: Family

## 2022-02-20 ENCOUNTER — Encounter: Payer: Self-pay | Admitting: Family

## 2022-02-20 DIAGNOSIS — F419 Anxiety disorder, unspecified: Secondary | ICD-10-CM | POA: Diagnosis not present

## 2022-02-20 DIAGNOSIS — E785 Hyperlipidemia, unspecified: Secondary | ICD-10-CM | POA: Diagnosis not present

## 2022-02-20 DIAGNOSIS — R102 Pelvic and perineal pain: Secondary | ICD-10-CM

## 2022-02-20 DIAGNOSIS — E1169 Type 2 diabetes mellitus with other specified complication: Secondary | ICD-10-CM | POA: Diagnosis not present

## 2022-02-20 MED ORDER — ALPRAZOLAM 0.25 MG PO TABS: 0.2500 mg | ORAL_TABLET | Freq: Every day | ORAL | 1 refills | Status: DC | PRN

## 2022-02-20 NOTE — Progress Notes (Signed)
Subjective:    Patient ID: Jaclyn Lin, female    DOB: 20-Apr-1966, 56 y.o.   MRN: [id number]  CC: Jaclyn Lin is a 56 y.o. female who presents today for follow up.   HPI: Feels well today.  No new complaints  Endorses dietary indiscretion with ice cream,candy.   No soda   Uses Xanax 0.25 mg very sparingly.  She request a refill today   New diagnosis of DM  She is compliant with crestor 29m, previously had been taking   Suprapubic pressure , described as ache. Only occurs at night.  No saddle anesthesia, constipation, dysuria, urinary frequency, fever.   During transvaginal ultrasound normal morphology of right ovary.  Few tiny follicles identified in the left ovary.  No free pelvic fluid or adnexal masses. Minimal degenerative disc disease seen on lumbar x-ray   colonoscopy is scheduled HISTORY:  Past Medical History:  Diagnosis Date   Asthma    childhood   Hypertension    UTI (lower urinary tract infection)    Past Surgical History:  Procedure Laterality Date   ABDOMINAL HYSTERECTOMY     partial, Dr. VVernie Ammons has ovaries; done due to fibriods.03/29/20 No cervix on exam today.     VAGINAL DELIVERY     2   Family History  Problem Relation Age of Onset   Hypertension Mother    Cancer Maternal Grandfather        bone   Asthma Maternal Grandmother    Brain cancer Maternal Grandmother    Asthma Son     Allergies: Codeine Current Outpatient Medications on File Prior to Visit  Medication Sig Dispense Refill   albuterol (PROVENTIL HFA;VENTOLIN HFA) 108 (90 Base) MCG/ACT inhaler Inhale 2 puffs into the lungs every 6 (six) hours as needed for wheezing or shortness of breath. 1 Inhaler 1   budesonide-formoterol (SYMBICORT) 80-4.5 MCG/ACT inhaler Inhale 2 puffs into the lungs 2 (two) times daily. 1 each 2   FLOWFLEX COVID-19 AG HOME TEST KIT      montelukast (SINGULAIR) 10 MG tablet TAKE 1 TABLET BY MOUTH EVERY DAY 90 tablet 3   rosuvastatin  (CRESTOR) 10 MG tablet Take one tablet by mouth every evening. 90 tablet 3   sertraline (ZOLOFT) 50 MG tablet TAKE 1 TABLET(50 MG) BY MOUTH AT BEDTIME 90 tablet 3   triamterene-hydrochlorothiazide (DYAZIDE) 37.5-25 MG capsule TAKE 1 CAPSULE BY MOUTH EVERY DAY 90 capsule 1   No current facility-administered medications on file prior to visit.    Social History   Tobacco Use   Smoking status: Every Day    Packs/day: 0.75    Years: 39.00    Total pack years: 29.25    Types: Cigarettes    Start date: 02/11/2016   Smokeless tobacco: Never   Tobacco comments:    smoking every couple of days  Vaping Use   Vaping Use: Never used  Substance Use Topics   Alcohol use: Yes    Comment: Beer on weekends; 6 pack   Drug use: No    Review of Systems  Constitutional:  Negative for chills and fever.  Respiratory:  Negative for cough.   Cardiovascular:  Negative for chest pain and palpitations.  Gastrointestinal:  Positive for abdominal pain (suprapubic). Negative for nausea and vomiting.      Objective:    BP 130/78 (BP Location: Left Arm, Patient Position: Sitting, Cuff Size: Normal)   Pulse 81   Temp 98.4 F (36.9 C) (Oral)   Ht 5'  7" (1.702 m)   Wt 185 lb 6.4 oz (84.1 kg)   SpO2 97%   BMI 29.04 kg/m  BP Readings from Last 3 Encounters:  02/20/22 130/78  02/01/22 134/82  12/11/20 122/80   Wt Readings from Last 3 Encounters:  02/20/22 185 lb 6.4 oz (84.1 kg)  02/01/22 187 lb 9.6 oz (85.1 kg)  12/11/20 180 lb 12.8 oz (82 kg)    Physical Exam Vitals reviewed.  Constitutional:      Appearance: She is well-developed.  Eyes:     Conjunctiva/sclera: Conjunctivae normal.  Cardiovascular:     Rate and Rhythm: Normal rate and regular rhythm.     Pulses: Normal pulses.     Heart sounds: Normal heart sounds.  Pulmonary:     Effort: Pulmonary effort is normal.     Breath sounds: Normal breath sounds. No wheezing, rhonchi or rales.  Skin:    General: Skin is warm and dry.   Neurological:     Mental Status: She is alert.  Psychiatric:        Speech: Speech normal.        Behavior: Behavior normal.        Thought Content: Thought content normal.        Assessment & Plan:   Problem List Items Addressed This Visit       Endocrine   Hyperlipidemia associated with type 2 diabetes mellitus ([place])    Lab Results  Component Value Date   HGBA1C 6.9 (H) 02/01/2022  New diagnosis diabetes.  Long discussion in regards to low glycemic diet.  Information provided on AVS.  Patient qdeclines trial of metformin and would like to work on diet.  Repeat A1c in 3 months time.  Patient is also now compliant with Crestor 10 mg.  We will obtain lipid panel at follow-up as well        Other   Anxiety    Chronic, stable.  Refilled Xanax 0.25 mg daily as needed today.I looked up patient on [place] Controlled Substances Reporting System PMP AWARE and saw no activity that raised concern of inappropriate use.        Relevant Medications   ALPRAZolam (XANAX) 0.25 MG tablet   Suprapubic pressure    Etiology nonspecific at this time.  Urinalysis negative for RBCs, infection.  01/2022 transvaginal ultrasound normal morphology of right ovary.  Few tiny follicles identified in the left ovary.  No free pelvic fluid or adnexal masses. 01/2022 Minimal degenerative disc disease seen on lumbar x-ray  11/2020 : Mild degenerative change of the hips   colonoscopy is scheduled 02/2022.  Advised CT abdomen and pelvis for more thorough investigation.  Patient declines in the absence of change of symptoms, she would like to have colonoscopy done and then readdress.        I am having Jaclyn Lin. Blattner maintain her albuterol, budesonide-formoterol, Flowflex COVID-19 Ag Home Test, montelukast, triamterene-hydrochlorothiazide, sertraline, rosuvastatin, and ALPRAZolam.   Meds ordered this encounter  Medications   ALPRAZolam (XANAX) 0.25 MG tablet    Sig: Take 1 tablet (0.25 mg total) by  mouth daily as needed for anxiety or sleep.    Dispense:  30 tablet    Refill:  1    Order Specific Question:   Supervising Provider    Answer:   Crecencio Mc [[id number]]    Return precautions given.   Risks, benefits, and alternatives of the medications and treatment plan prescribed today were discussed, and patient expressed understanding.  Education regarding symptom management and diagnosis given to patient on AVS.  Continue to follow with Burnard Hawthorne, FNP for routine health maintenance.   Jaclyn Lin and I agreed with plan.   Mable Paris, FNP

## 2022-02-20 NOTE — Assessment & Plan Note (Signed)
Chronic, stable.  Refilled Xanax 0.25 mg daily as needed today.I looked up patient on [place] Controlled Substances Reporting System PMP AWARE and saw no activity that raised concern of inappropriate use.

## 2022-02-20 NOTE — Assessment & Plan Note (Signed)
Etiology nonspecific at this time.  Urinalysis negative for RBCs, infection.  01/2022 transvaginal ultrasound normal morphology of right ovary.  Few tiny follicles identified in the left ovary.  No free pelvic fluid or adnexal masses. 01/2022 Minimal degenerative disc disease seen on lumbar x-ray  11/2020 : Mild degenerative change of the hips   colonoscopy is scheduled 02/2022.  Advised CT abdomen and pelvis for more thorough investigation.  Patient declines in the absence of change of symptoms, she would like to have colonoscopy done and then readdress.

## 2022-02-20 NOTE — Patient Instructions (Addendum)
Call [facility] to schedule CT lung cancer screen  [phone]   Please let me know if abdominal discomfort changes in any way or new symptoms develop.    Diabetes Mellitus and Nutrition, Adult When you have diabetes, or diabetes mellitus, it is very important to have healthy eating habits because your blood sugar (glucose) levels are greatly affected by what you eat and drink. Eating healthy foods in the right amounts, at about the same times every day, can help you: Manage your blood glucose. Lower your risk of heart disease. Improve your blood pressure. Reach or maintain a healthy weight. What can affect my meal plan? Every person with diabetes is different, and each person has different needs for a meal plan. Your health care provider may recommend that you work with a dietitian to make a meal plan that is best for you. Your meal plan may vary depending on factors such as: The calories you need. The medicines you take. Your weight. Your blood glucose, blood pressure, and cholesterol levels. Your activity level. Other health conditions you have, such as heart or kidney disease. How do carbohydrates affect me? Carbohydrates, also called carbs, affect your blood glucose level more than any other type of food. Eating carbs raises the amount of glucose in your blood. It is important to know how many carbs you can safely have in each meal. This is different for every person. Your dietitian can help you calculate how many carbs you should have at each meal and for each snack. How does alcohol affect me? Alcohol can cause a decrease in blood glucose (hypoglycemia), especially if you use insulin or take certain diabetes medicines by mouth. Hypoglycemia can be a life-threatening condition. Symptoms of hypoglycemia, such as sleepiness, dizziness, and confusion, are similar to symptoms of having too much alcohol. Do not drink alcohol if: Your health care provider tells you not to  drink. You are pregnant, may be pregnant, or are planning to become pregnant. If you drink alcohol: Limit how much you have to: 0-1 drink a day for women. 0-2 drinks a day for men. Know how much alcohol is in your drink. In the [place] one drink equals one 12 oz bottle of beer (355 mL), one 5 oz glass of wine (148 mL), or one 1 oz glass of hard liquor (44 mL). Keep yourself hydrated with water, diet soda, or unsweetened iced tea. Keep in mind that regular soda, juice, and other mixers may contain a lot of sugar and must be counted as carbs. What are tips for following this plan?  Reading food labels Start by checking the serving size on the Nutrition Facts label of packaged foods and drinks. The number of calories and the amount of carbs, fats, and other nutrients listed on the label are based on one serving of the item. Many items contain more than one serving per package. Check the total grams (g) of carbs in one serving. Check the number of grams of saturated fats and trans fats in one serving. Choose foods that have a low amount or none of these fats. Check the number of milligrams (mg) of salt (sodium) in one serving. Most people should limit total sodium intake to less than 2,300 mg per day. Always check the nutrition information of foods labeled as "low-fat" or "nonfat." These foods may be higher in added sugar or refined carbs and should be avoided. Talk to your dietitian to identify your daily goals for nutrients listed on the label. Shopping  Avoid buying canned, pre-made, or processed foods. These foods tend to be high in fat, sodium, and added sugar. Shop around the outside edge of the grocery store. This is where you will most often find fresh fruits and vegetables, bulk grains, fresh meats, and fresh dairy products. Cooking Use low-heat cooking methods, such as baking, instead of high-heat cooking methods, such as deep frying. Cook using healthy oils, such as olive, canola, or  sunflower oil. Avoid cooking with butter, cream, or high-fat meats. Meal planning Eat meals and snacks regularly, preferably at the same times every day. Avoid going long periods of time without eating. Eat foods that are high in fiber, such as fresh fruits, vegetables, beans, and whole grains. Eat 4-6 oz (112-168 g) of lean protein each day, such as lean meat, chicken, fish, eggs, or tofu. One ounce (oz) (28 g) of lean protein is equal to: 1 oz (28 g) of meat, chicken, or fish. 1 egg.  cup (62 g) of tofu. Eat some foods each day that contain healthy fats, such as avocado, nuts, seeds, and fish. What foods should I eat? Fruits Berries. Apples. Oranges. Peaches. Apricots. Plums. Grapes. Mangoes. Papayas. Pomegranates. Kiwi. Cherries. Vegetables Leafy greens, including lettuce, spinach, kale, chard, collard greens, mustard greens, and cabbage. Beets. Cauliflower. Broccoli. Carrots. Green beans. Tomatoes. Peppers. Onions. Cucumbers. Brussels sprouts. Grains Whole grains, such as whole-wheat or whole-grain bread, crackers, tortillas, cereal, and pasta. Unsweetened oatmeal. Quinoa. Brown or wild rice. Meats and other proteins Seafood. Poultry without skin. Lean cuts of poultry and beef. Tofu. Nuts. Seeds. Dairy Low-fat or fat-free dairy products such as milk, yogurt, and cheese. The items listed above may not be a complete list of foods and beverages you can eat and drink. Contact a dietitian for more information. What foods should I avoid? Fruits Fruits canned with syrup. Vegetables Canned vegetables. Frozen vegetables with butter or cream sauce. Grains Refined white flour and flour products such as bread, pasta, snack foods, and cereals. Avoid all processed foods. Meats and other proteins Fatty cuts of meat. Poultry with skin. Breaded or fried meats. Processed meat. Avoid saturated fats. Dairy Full-fat yogurt, cheese, or milk. Beverages Sweetened drinks, such as soda or iced tea. The  items listed above may not be a complete list of foods and beverages you should avoid. Contact a dietitian for more information. Questions to ask a health care provider Do I need to meet with a certified diabetes care and education specialist? Do I need to meet with a dietitian? What number can I call if I have questions? When are the best times to check my blood glucose? Where to find more information: American Diabetes Association: diabetes.org Academy of Nutrition and Dietetics: eatright.[employer] of Diabetes and Digestive and Kidney Diseases: [email or web address].si Association of Diabetes Care & Education Specialists: diabeteseducator.org Summary It is important to have healthy eating habits because your blood sugar (glucose) levels are greatly affected by what you eat and drink. It is important to use alcohol carefully. A healthy meal plan will help you manage your blood glucose and lower your risk of heart disease. Your health care provider may recommend that you work with a dietitian to make a meal plan that is best for you. This information is not intended to replace advice given to you by your health care provider. Make sure you discuss any questions you have with your health care provider. Document Revised: 01/12/2020 Document Reviewed: 01/12/2020 Elsevier Patient Education  2023 Elsevier Inc. Diabetes Mellitus and Exercise  Exercising regularly is important for overall health, especially for people who have diabetes mellitus. Exercising is not only about losing weight. It has many other health benefits, such as increasing muscle strength and bone density and reducing body fat and stress. This leads to improved fitness, flexibility, and endurance, all of which result in better overall health. What are the benefits of exercise if I have diabetes? Exercise has many benefits for people with diabetes. They include: Helping to lower and control blood sugar (glucose). Helping the body  to respond better to the hormone insulin by improving insulin sensitivity. Reducing how much insulin the body needs. Lowering the risk for heart disease by: Lowering "bad" cholesterol and triglyceride levels. Increasing "good" cholesterol levels. Lowering blood pressure. Lowering blood glucose levels. What is my activity plan? Your health care provider or certified diabetes educator can help you make a plan for the type and frequency of exercise that works for you. This is called your activity plan. Be sure to: Get at least 150 minutes of medium-intensity or high-intensity exercise each week. Exercises may include brisk walking, biking, or water aerobics. Do stretching and strengthening exercises, such as yoga or weight lifting, at least 2 times a week. Spread out your activity over at least 3 days of the week. Get some form of physical activity each day. Do not go more than 2 days in a row without some kind of physical activity. Avoid being inactive for more than 90 minutes at a time. Take frequent breaks to walk or stretch. Choose exercises or activities that you enjoy. Set realistic goals. Start slowly and gradually increase your exercise intensity over time. How do I manage my diabetes during exercise?  Monitor your blood glucose Check your blood glucose before and after exercising. If your blood glucose is: 240 mg/dL (69.6 mmol/L) or higher before you exercise, check your urine for ketones. These are chemicals created by the liver. If you have ketones in your urine, do not exercise until your blood glucose returns to normal. 100 mg/dL (5.6 mmol/L) or lower, eat a snack containing 15-20 grams of carbohydrate. Check your blood glucose 15 minutes after the snack to make sure that your glucose level is above 100 mg/dL (5.6 mmol/L) before you start your exercise. Know the symptoms of low blood glucose (hypoglycemia) and how to treat it. Your risk for hypoglycemia increases during and after  exercise. Follow these tips and your health care provider's instructions Keep a carbohydrate snack that is fast-acting for use before, during, and after exercise to help prevent or treat hypoglycemia. Avoid injecting insulin into areas of the body that are going to be exercised. For example, avoid injecting insulin into: Your arms, when you are about to play tennis. Your legs, when you are about to go jogging. Keep records of your exercise habits. Doing this can help you and your health care provider adjust your diabetes management plan as needed. Write down: Food that you eat before and after you exercise. Blood glucose levels before and after you exercise. The type and amount of exercise you have done. Work with your health care provider when you start a new exercise or activity. He or she may need to: Make sure that the activity is safe for you. Adjust your insulin, other medicines, and food that you eat. Drink plenty of water while you exercise. This prevents loss of water (dehydration) and problems caused by a lot of heat in the body (heat stroke). Where to find more information American Diabetes  Association: www.diabetes.org Summary Exercising regularly is important for overall health, especially for people who have diabetes mellitus. Exercising has many health benefits. It increases muscle strength and bone density and reduces body fat and stress. It also lowers and controls blood glucose. Your health care provider or certified diabetes educator can help you make an activity plan for the type and frequency of exercise that works for you. Work with your health care provider to make sure any new activity is safe for you. Also work with your health care provider to adjust your insulin, other medicines, and the food you eat. This information is not intended to replace advice given to you by your health care provider. Make sure you discuss any questions you have with your health care  provider. Document Revised: 03/08/2019 Document Reviewed: 03/08/2019 Elsevier Patient Education  2023 Elsevier Inc. Diabetes Mellitus and Nutrition, Adult When you have diabetes, or diabetes mellitus, it is very important to have healthy eating habits because your blood sugar (glucose) levels are greatly affected by what you eat and drink. Eating healthy foods in the right amounts, at about the same times every day, can help you: Manage your blood glucose. Lower your risk of heart disease. Improve your blood pressure. Reach or maintain a healthy weight. What can affect my meal plan? Every person with diabetes is different, and each person has different needs for a meal plan. Your health care provider may recommend that you work with a dietitian to make a meal plan that is best for you. Your meal plan may vary depending on factors such as: The calories you need. The medicines you take. Your weight. Your blood glucose, blood pressure, and cholesterol levels. Your activity level. Other health conditions you have, such as heart or kidney disease. How do carbohydrates affect me? Carbohydrates, also called carbs, affect your blood glucose level more than any other type of food. Eating carbs raises the amount of glucose in your blood. It is important to know how many carbs you can safely have in each meal. This is different for every person. Your dietitian can help you calculate how many carbs you should have at each meal and for each snack. How does alcohol affect me? Alcohol can cause a decrease in blood glucose (hypoglycemia), especially if you use insulin or take certain diabetes medicines by mouth. Hypoglycemia can be a life-threatening condition. Symptoms of hypoglycemia, such as sleepiness, dizziness, and confusion, are similar to symptoms of having too much alcohol. Do not drink alcohol if: Your health care provider tells you not to drink. You are pregnant, may be pregnant, or are planning  to become pregnant. If you drink alcohol: Limit how much you have to: 0-1 drink a day for women. 0-2 drinks a day for men. Know how much alcohol is in your drink. In the [place] one drink equals one 12 oz bottle of beer (355 mL), one 5 oz glass of wine (148 mL), or one 1 oz glass of hard liquor (44 mL). Keep yourself hydrated with water, diet soda, or unsweetened iced tea. Keep in mind that regular soda, juice, and other mixers may contain a lot of sugar and must be counted as carbs. What are tips for following this plan?  Reading food labels Start by checking the serving size on the Nutrition Facts label of packaged foods and drinks. The number of calories and the amount of carbs, fats, and other nutrients listed on the label are based on one serving of the item. Many  items contain more than one serving per package. Check the total grams (g) of carbs in one serving. Check the number of grams of saturated fats and trans fats in one serving. Choose foods that have a low amount or none of these fats. Check the number of milligrams (mg) of salt (sodium) in one serving. Most people should limit total sodium intake to less than 2,300 mg per day. Always check the nutrition information of foods labeled as "low-fat" or "nonfat." These foods may be higher in added sugar or refined carbs and should be avoided. Talk to your dietitian to identify your daily goals for nutrients listed on the label. Shopping Avoid buying canned, pre-made, or processed foods. These foods tend to be high in fat, sodium, and added sugar. Shop around the outside edge of the grocery store. This is where you will most often find fresh fruits and vegetables, bulk grains, fresh meats, and fresh dairy products. Cooking Use low-heat cooking methods, such as baking, instead of high-heat cooking methods, such as deep frying. Cook using healthy oils, such as olive, canola, or sunflower oil. Avoid cooking with butter, cream, or high-fat  meats. Meal planning Eat meals and snacks regularly, preferably at the same times every day. Avoid going long periods of time without eating. Eat foods that are high in fiber, such as fresh fruits, vegetables, beans, and whole grains. Eat 4-6 oz (112-168 g) of lean protein each day, such as lean meat, chicken, fish, eggs, or tofu. One ounce (oz) (28 g) of lean protein is equal to: 1 oz (28 g) of meat, chicken, or fish. 1 egg.  cup (62 g) of tofu. Eat some foods each day that contain healthy fats, such as avocado, nuts, seeds, and fish. What foods should I eat? Fruits Berries. Apples. Oranges. Peaches. Apricots. Plums. Grapes. Mangoes. Papayas. Pomegranates. Kiwi. Cherries. Vegetables Leafy greens, including lettuce, spinach, kale, chard, collard greens, mustard greens, and cabbage. Beets. Cauliflower. Broccoli. Carrots. Green beans. Tomatoes. Peppers. Onions. Cucumbers. Brussels sprouts. Grains Whole grains, such as whole-wheat or whole-grain bread, crackers, tortillas, cereal, and pasta. Unsweetened oatmeal. Quinoa. Brown or wild rice. Meats and other proteins Seafood. Poultry without skin. Lean cuts of poultry and beef. Tofu. Nuts. Seeds. Dairy Low-fat or fat-free dairy products such as milk, yogurt, and cheese. The items listed above may not be a complete list of foods and beverages you can eat and drink. Contact a dietitian for more information. What foods should I avoid? Fruits Fruits canned with syrup. Vegetables Canned vegetables. Frozen vegetables with butter or cream sauce. Grains Refined white flour and flour products such as bread, pasta, snack foods, and cereals. Avoid all processed foods. Meats and other proteins Fatty cuts of meat. Poultry with skin. Breaded or fried meats. Processed meat. Avoid saturated fats. Dairy Full-fat yogurt, cheese, or milk. Beverages Sweetened drinks, such as soda or iced tea. The items listed above may not be a complete list of foods and  beverages you should avoid. Contact a dietitian for more information. Questions to ask a health care provider Do I need to meet with a certified diabetes care and education specialist? Do I need to meet with a dietitian? What number can I call if I have questions? When are the best times to check my blood glucose? Where to find more information: American Diabetes Association: diabetes.org Academy of Nutrition and Dietetics: eatright.[employer] National Institute of Diabetes and Digestive and Kidney Diseases: StageSync.siniddk.nih.gov Association of Diabetes Care & Education Specialists: diabeteseducator.org Summary It is important  to have healthy eating habits because your blood sugar (glucose) levels are greatly affected by what you eat and drink. It is important to use alcohol carefully. A healthy meal plan will help you manage your blood glucose and lower your risk of heart disease. Your health care provider may recommend that you work with a dietitian to make a meal plan that is best for you. This information is not intended to replace advice given to you by your health care provider. Make sure you discuss any questions you have with your health care provider. Document Revised: 01/12/2020 Document Reviewed: 01/12/2020 Elsevier Patient Education  2023 [employer].

## 2022-02-20 NOTE — Assessment & Plan Note (Signed)
Lab Results  Component Value Date   HGBA1C 6.9 (H) 02/01/2022   New diagnosis diabetes.  Long discussion in regards to low glycemic diet.  Information provided on AVS.  Patient qdeclines trial of metformin and would like to work on diet.  Repeat A1c in 3 months time.  Patient is also now compliant with Crestor 10 mg.  We will obtain lipid panel at follow-up as well

## 2022-02-27 ENCOUNTER — Encounter: Payer: Self-pay | Admitting: Family

## 2022-03-01 ENCOUNTER — Other Ambulatory Visit: Payer: Self-pay | Admitting: Family

## 2022-03-01 NOTE — Progress Notes (Signed)
close

## 2022-03-19 ENCOUNTER — Ambulatory Visit
Admission: RE | Admit: 2022-03-19 | Payer: Managed Care, Other (non HMO) | Source: Home / Self Care | Admitting: Gastroenterology

## 2022-03-19 ENCOUNTER — Encounter: Admission: RE | Payer: Self-pay | Source: Home / Self Care

## 2022-03-19 SURGERY — COLONOSCOPY WITH PROPOFOL
Anesthesia: General

## 2022-04-03 ENCOUNTER — Ambulatory Visit (INDEPENDENT_AMBULATORY_CARE_PROVIDER_SITE_OTHER): Payer: Managed Care, Other (non HMO) | Admitting: Family

## 2022-04-03 ENCOUNTER — Encounter: Payer: Self-pay | Admitting: Family

## 2022-04-03 VITALS — BP 130/78 | HR 76 | Temp 98.5°F | Ht 66.0 in | Wt 187.2 lb

## 2022-04-03 DIAGNOSIS — R109 Unspecified abdominal pain: Secondary | ICD-10-CM

## 2022-04-03 DIAGNOSIS — R102 Pelvic and perineal pain: Secondary | ICD-10-CM

## 2022-04-03 NOTE — Progress Notes (Signed)
Subjective:    Patient ID: Jaclyn Lin, female    DOB: 08/05/1965, 56 y.o.   MRN: [id number]  CC: Jaclyn Lin is a 56 y.o. female who presents today for follow up.   HPI: Feels well today.  No new complaints. She is working diligently on eating less carbohydrates and sugar.  She is not taking sugary drinks   She continues to feels lower abdominal pain describing 'at vagina very low' at night.  No numbness, constipation, trouble urinating, bowel continence, saddle anesthesia.  Pap smear is uptodate. She will ibuprofen 44m qhs prn with relief.  She has mobic at home.   Patient missed colonoscopy and plans to reschedule.  HISTORY:  Past Medical History:  Diagnosis Date   Asthma    childhood   Hypertension    UTI (lower urinary tract infection)    Past Surgical History:  Procedure Laterality Date   ABDOMINAL HYSTERECTOMY     partial, Dr. VVernie Ammons has ovaries; done due to fibriods.03/29/20 No cervix on exam today.     VAGINAL DELIVERY     2   Family History  Problem Relation Age of Onset   Hypertension Mother    Cancer Maternal Grandfather        bone   Asthma Maternal Grandmother    Brain cancer Maternal Grandmother    Asthma Son     Allergies: Codeine Current Outpatient Medications on File Prior to Visit  Medication Sig Dispense Refill   albuterol (PROVENTIL HFA;VENTOLIN HFA) 108 (90 Base) MCG/ACT inhaler Inhale 2 puffs into the lungs every 6 (six) hours as needed for wheezing or shortness of breath. 1 Inhaler 1   ALPRAZolam (XANAX) 0.25 MG tablet Take 1 tablet (0.25 mg total) by mouth daily as needed for anxiety or sleep. 30 tablet 1   budesonide-formoterol (SYMBICORT) 80-4.5 MCG/ACT inhaler Inhale 2 puffs into the lungs 2 (two) times daily. 1 each 2   FLOWFLEX COVID-19 AG HOME TEST KIT      montelukast (SINGULAIR) 10 MG tablet TAKE 1 TABLET BY MOUTH EVERY DAY 90 tablet 3   rosuvastatin (CRESTOR) 10 MG tablet Take one tablet by mouth every  evening. 90 tablet 3   sertraline (ZOLOFT) 50 MG tablet TAKE 1 TABLET(50 MG) BY MOUTH AT BEDTIME 90 tablet 3   triamterene-hydrochlorothiazide (DYAZIDE) 37.5-25 MG capsule TAKE 1 CAPSULE BY MOUTH EVERY DAY 90 capsule 1   No current facility-administered medications on file prior to visit.    Social History   Tobacco Use   Smoking status: Every Day    Packs/day: 0.75    Years: 39.00    Total pack years: 29.25    Types: Cigarettes    Start date: 02/11/2016   Smokeless tobacco: Never   Tobacco comments:    smoking every couple of days  Vaping Use   Vaping Use: Never used  Substance Use Topics   Alcohol use: Yes    Comment: Beer on weekends; 6 pack   Drug use: No    Review of Systems  Constitutional:  Negative for chills and fever.  Respiratory:  Negative for cough.   Cardiovascular:  Negative for chest pain and palpitations.  Gastrointestinal:  Positive for abdominal pain. Negative for nausea and vomiting.  Genitourinary:  Positive for pelvic pain. Negative for dysuria.      Objective:    BP 130/78 (BP Location: Left Arm, Patient Position: Sitting, Cuff Size: Normal)   Pulse 76   Temp 98.5 F (36.9 C) (Oral)  Ht _0  (1.676 m)   Wt 187 lb 3.2 oz (84.9 kg)   SpO2 96%   BMI 30.21 kg/m  BP Readings from Last 3 Encounters:  04/03/22 130/78  02/20/22 130/78  02/01/22 134/82   Wt Readings from Last 3 Encounters:  04/03/22 187 lb 3.2 oz (84.9 kg)  02/20/22 185 lb 6.4 oz (84.1 kg)  02/01/22 187 lb 9.6 oz (85.1 kg)    Physical Exam Vitals reviewed.  Constitutional:      Appearance: She is well-developed.  Eyes:     Conjunctiva/sclera: Conjunctivae normal.  Cardiovascular:     Rate and Rhythm: Normal rate and regular rhythm.     Pulses: Normal pulses.     Heart sounds: Normal heart sounds.  Pulmonary:     Effort: Pulmonary effort is normal.     Breath sounds: Normal breath sounds. No wheezing, rhonchi or rales.  Musculoskeletal:     Lumbar back: No swelling,  edema, spasms, tenderness or bony tenderness. Normal range of motion.     Comments: Full range of motion with flexion, tension, lateral side bends. No bony tenderness. No pain, numbness, tingling elicited with single leg raise bilaterally.  Bilateral Hips: No limp or waddling gait. Full ROM with flexion and hip rotation in flexion.    No pain of lateral hip with  (flexion-abduction-external rotation) test.   Slight pain with deep palpation of greater trochanter on right side.     Skin:    General: Skin is warm and dry.  Neurological:     Mental Status: She is alert.     Sensory: No sensory deficit.     Deep Tendon Reflexes:     Reflex Scores:      Patellar reflexes are 2+ on the right side and 2+ on the left side.    Comments: Sensation and strength intact bilateral lower extremities.  Psychiatric:        Speech: Speech normal.        Behavior: Behavior normal.        Thought Content: Thought content normal.        Assessment & Plan:   Problem List Items Addressed This Visit       Other   Suprapubic pressure    Etiology unclear at this time.  On exam today, diffuse lower abdominal pain appreciated.  Advised patient that we should move forward CT abdomen and pelvis.  She will reschedule colonoscopy as well.  Overall benign musculoskeletal exam of low back and hips today; however discussed again that I would consider musculoskeletal etiology based on minimal degenerative disc disease of lumbar spine as well as at the hips.  Consider sports medicine evaluation after CT abdomen and pelvis.  Advised patient to trial meloxicam 7.5 mg which she has at home for several days to see if pain were to improve.      Other Visit Diagnoses     Abdominal pain, unspecified abdominal location    -  Primary   Relevant Orders   CT ABDOMEN PELVIS W CONTRAST        I am having Jaclyn Lin. Enamorado maintain her albuterol, budesonide-formoterol, Flowflex COVID-19 Ag Home Test, montelukast,  triamterene-hydrochlorothiazide, sertraline, rosuvastatin, and ALPRAZolam.   No orders of the defined types were placed in this encounter.   Return precautions given.   Risks, benefits, and alternatives of the medications and treatment plan prescribed today were discussed, and patient expressed understanding.   Education regarding symptom management and diagnosis given to patient  on AVS.  Continue to follow with Burnard Hawthorne, FNP for routine health maintenance.   Jaclyn Lin and I agreed with plan.   Mable Paris, FNP

## 2022-04-03 NOTE — Assessment & Plan Note (Addendum)
Etiology unclear at this time.  On exam today, diffuse lower abdominal pain appreciated.  Advised patient that we should move forward CT abdomen and pelvis.  She will reschedule colonoscopy as well.  Overall benign musculoskeletal exam of low back and hips today; however discussed again that I would consider musculoskeletal etiology based on minimal degenerative disc disease of lumbar spine as well as at the hips.  Consider sports medicine evaluation after CT abdomen and pelvis.  Advised patient to trial meloxicam 7.5 mg which she has at home for several days to see if pain were to improve.

## 2022-04-03 NOTE — Patient Instructions (Addendum)
Call [facility] to schedule colonoscopy  [phone]  I  have ordered CT abdomen.   Let us know if you dont hear back within a week in regards to an appointment being scheduled.

## 2022-04-04 ENCOUNTER — Other Ambulatory Visit: Payer: Self-pay | Admitting: Family

## 2022-04-04 DIAGNOSIS — I10 Essential (primary) hypertension: Secondary | ICD-10-CM

## 2022-04-11 ENCOUNTER — Ambulatory Visit
Admission: RE | Admit: 2022-04-11 | Discharge: 2022-04-11 | Disposition: A | Discharge status: Home / Self Care | Payer: Managed Care, Other (non HMO) | Source: Ambulatory Visit | Attending: Family | Admitting: Family

## 2022-04-11 DIAGNOSIS — R109 Unspecified abdominal pain: Secondary | ICD-10-CM | POA: Diagnosis present

## 2022-04-11 MED ORDER — IOHEXOL 300 MG/ML  SOLN
100.0000 mL | Freq: Once | INTRAMUSCULAR | Status: AC | PRN
  Administered 2022-04-11: 100 mL via INTRAVENOUS

## 2022-04-16 LAB — POCT I-STAT CREATININE: Creatinine, Ser: 0.8 mg/dL (ref 0.44–1.00)

## 2022-04-30 ENCOUNTER — Other Ambulatory Visit: Payer: Self-pay | Admitting: Family

## 2022-04-30 ENCOUNTER — Telehealth: Payer: Self-pay

## 2022-04-30 DIAGNOSIS — I700 Atherosclerosis of aorta: Secondary | ICD-10-CM | POA: Insufficient documentation

## 2022-04-30 DIAGNOSIS — K769 Liver disease, unspecified: Secondary | ICD-10-CM | POA: Insufficient documentation

## 2022-04-30 DIAGNOSIS — R102 Pelvic and perineal pain: Secondary | ICD-10-CM

## 2022-04-30 DIAGNOSIS — R911 Solitary pulmonary nodule: Secondary | ICD-10-CM

## 2022-04-30 NOTE — Telephone Encounter (Signed)
LMTCB to confirm no metal in patient's body. She has reviewed message on mychart.

## 2022-05-10 ENCOUNTER — Ambulatory Visit: Admission: RE | Admit: 2022-05-10 | Payer: Managed Care, Other (non HMO) | Source: Ambulatory Visit

## 2022-05-10 ENCOUNTER — Ambulatory Visit: Payer: Managed Care, Other (non HMO)

## 2022-05-15 ENCOUNTER — Ambulatory Visit: Payer: Managed Care, Other (non HMO) | Admitting: Family

## 2022-05-15 ENCOUNTER — Telehealth: Payer: Self-pay | Admitting: Family

## 2022-05-15 ENCOUNTER — Encounter: Payer: Self-pay | Admitting: Family

## 2022-05-15 VITALS — BP 130/72 | HR 88 | Temp 98.2°F | Wt 183.0 lb

## 2022-05-15 DIAGNOSIS — I700 Atherosclerosis of aorta: Secondary | ICD-10-CM | POA: Diagnosis not present

## 2022-05-15 DIAGNOSIS — R059 Cough, unspecified: Secondary | ICD-10-CM | POA: Diagnosis not present

## 2022-05-15 DIAGNOSIS — E1169 Type 2 diabetes mellitus with other specified complication: Secondary | ICD-10-CM

## 2022-05-15 DIAGNOSIS — U071 COVID-19: Secondary | ICD-10-CM

## 2022-05-15 DIAGNOSIS — K769 Liver disease, unspecified: Secondary | ICD-10-CM

## 2022-05-15 DIAGNOSIS — R911 Solitary pulmonary nodule: Secondary | ICD-10-CM

## 2022-05-15 DIAGNOSIS — E785 Hyperlipidemia, unspecified: Secondary | ICD-10-CM

## 2022-05-15 DIAGNOSIS — R7309 Other abnormal glucose: Secondary | ICD-10-CM

## 2022-05-15 DIAGNOSIS — R102 Pelvic and perineal pain: Secondary | ICD-10-CM

## 2022-05-15 LAB — COMPREHENSIVE METABOLIC PANEL
ALT: 16 U/L (ref 0–35)
AST: 17 U/L (ref 0–37)
Albumin: 4.5 g/dL (ref 3.5–5.2)
Alkaline Phosphatase: 61 U/L (ref 39–117)
BUN: 18 mg/dL (ref 6–23)
CO2: 30 mEq/L (ref 19–32)
Calcium: 9.8 mg/dL (ref 8.4–10.5)
Chloride: 102 mEq/L (ref 96–112)
Creatinine, Ser: 0.65 mg/dL (ref 0.40–1.20)
GFR: 98.11 mL/min (ref >60.00)
Glucose, Bld: 123 mg/dL — ABNORMAL HIGH (ref 70–99)
Potassium: 3.8 mEq/L (ref 3.5–5.1)
Sodium: 140 mEq/L (ref 135–145)
Total Bilirubin: 0.4 mg/dL (ref 0.2–1.2)
Total Protein: 7.3 g/dL (ref 6.0–8.3)

## 2022-05-15 LAB — LIPID PANEL
Cholesterol: 160 mg/dL (ref 0–200)
HDL: 46.1 mg/dL (ref >39.00)
LDL Cholesterol: 92 mg/dL (ref 0–99)
NonHDL: 114.16
Total CHOL/HDL Ratio: 3
Triglycerides: 113 mg/dL (ref 0.0–149.0)
VLDL: 22.6 mg/dL (ref 0.0–40.0)

## 2022-05-15 LAB — POCT GLYCOSYLATED HEMOGLOBIN (HGB A1C): Hemoglobin A1C: 6.2 % — AB (ref 4.0–5.6)

## 2022-05-15 LAB — POC COVID19 BINAXNOW: SARS Coronavirus 2 Ag: NEGATIVE

## 2022-05-15 MED ORDER — GUAIFENESIN-CODEINE 100-10 MG/5ML PO SYRP: 5.0000 mL | ORAL_SOLUTION | Freq: Every evening | ORAL | 0 refills | Status: DC | PRN

## 2022-05-15 NOTE — Progress Notes (Signed)
Subjective:    Patient ID: Jaclyn Lin, female    DOB: October 12, 1965, 56 y.o.   MRN: [id number]  CC: Jaclyn Lin is a 56 y.o. female who presents today for follow up.   HPI: Covid 10 days ago. Symptoms have significantly improved. No fever, sob, wheezing.  She continues to have occasional cough, worse at that time.  Cough is productive with thick white congestion.  Tessalon Perles not helping her.  She prefers a codeine preparation.  She has been taking plain guaifenesin.  Atherosclerosis-compliant with Crestor 10 mg    CT a/p 04/11/22 significant for 12 mm lesion dome of liver.  No splenomegaly.  No lymphadenopathy.  Status post hysterectomy.  No adnexal mass.  Asymmetric sclerosis along the iliac portion right greater than left.  3 mm pulmonary nodule.  Aortic atherosclerosis CT chest and MR abdomen canceled d/t covid infection She has not been scheduled with orthopedics HISTORY:  Past Medical History:  Diagnosis Date   Asthma    childhood   Hypertension    UTI (lower urinary tract infection)    Past Surgical History:  Procedure Laterality Date   ABDOMINAL HYSTERECTOMY     partial, Dr. Vernie Ammons; has ovaries; done due to fibriods.03/29/20 No cervix on exam today.     VAGINAL DELIVERY     2   Family History  Problem Relation Age of Onset   Hypertension Mother    Cancer Maternal Grandfather        bone   Asthma Maternal Grandmother    Brain cancer Maternal Grandmother    Asthma Son     Allergies: Codeine Current Outpatient Medications on File Prior to Visit  Medication Sig Dispense Refill   albuterol (PROVENTIL HFA;VENTOLIN HFA) 108 (90 Base) MCG/ACT inhaler Inhale 2 puffs into the lungs every 6 (six) hours as needed for wheezing or shortness of breath. 1 Inhaler 1   ALPRAZolam (XANAX) 0.25 MG tablet Take 1 tablet (0.25 mg total) by mouth daily as needed for anxiety or sleep. 30 tablet 1   budesonide-formoterol (SYMBICORT) 80-4.5 MCG/ACT inhaler Inhale  2 puffs into the lungs 2 (two) times daily. 1 each 2   FLOWFLEX COVID-19 AG HOME TEST KIT      montelukast (SINGULAIR) 10 MG tablet TAKE 1 TABLET BY MOUTH EVERY DAY 90 tablet 3   rosuvastatin (CRESTOR) 10 MG tablet Take one tablet by mouth every evening. 90 tablet 3   sertraline (ZOLOFT) 50 MG tablet TAKE 1 TABLET(50 MG) BY MOUTH AT BEDTIME 90 tablet 3   triamterene-hydrochlorothiazide (DYAZIDE) 37.5-25 MG capsule TAKE 1 CAPSULE BY MOUTH EVERY DAY 90 capsule 1   No current facility-administered medications on file prior to visit.    Social History   Tobacco Use   Smoking status: Every Day    Packs/day: 0.75    Years: 39.00    Total pack years: 29.25    Types: Cigarettes    Start date: 02/11/2016   Smokeless tobacco: Never   Tobacco comments:    smoking every couple of days  Vaping Use   Vaping Use: Never used  Substance Use Topics   Alcohol use: Yes    Comment: Beer on weekends; 6 pack   Drug use: No    Review of Systems  Constitutional:  Negative for chills and fever.  HENT:  Positive for congestion.   Respiratory:  Positive for cough. Negative for shortness of breath and wheezing.   Cardiovascular:  Negative for chest pain and palpitations.  Gastrointestinal:  Negative for nausea and vomiting.      Objective:    BP 130/72 (BP Location: Left Arm, Patient Position: Sitting, Cuff Size: Normal)   Pulse 88   Temp 98.2 F (36.8 C) (Oral)   Wt 183 lb (83 kg)   SpO2 97%   BMI 29.54 kg/m  BP Readings from Last 3 Encounters:  05/15/22 130/72  04/03/22 130/78  02/20/22 130/78   Wt Readings from Last 3 Encounters:  05/15/22 183 lb (83 kg)  04/03/22 187 lb 3.2 oz (84.9 kg)  02/20/22 185 lb 6.4 oz (84.1 kg)    Physical Exam Vitals reviewed.  Constitutional:      Appearance: She is well-developed.  HENT:     Head: Normocephalic and atraumatic.     Right Ear: Hearing, tympanic membrane, ear canal and external ear normal. No decreased hearing noted. No drainage,  swelling or tenderness. No middle ear effusion. No foreign body. Tympanic membrane is not erythematous or bulging.     Left Ear: Hearing, tympanic membrane, ear canal and external ear normal. No decreased hearing noted. No drainage, swelling or tenderness.  No middle ear effusion. No foreign body. Tympanic membrane is not erythematous or bulging.     Nose: Nose normal. No rhinorrhea.     Right Sinus: No maxillary sinus tenderness or frontal sinus tenderness.     Left Sinus: No maxillary sinus tenderness or frontal sinus tenderness.     Mouth/Throat:     Pharynx: Uvula midline. No oropharyngeal exudate or posterior oropharyngeal erythema.     Tonsils: No tonsillar abscesses.  Eyes:     Conjunctiva/sclera: Conjunctivae normal.  Cardiovascular:     Rate and Rhythm: Regular rhythm.     Pulses: Normal pulses.     Heart sounds: Normal heart sounds.  Pulmonary:     Effort: Pulmonary effort is normal.     Breath sounds: Normal breath sounds. No wheezing, rhonchi or rales.  Lymphadenopathy:     Head:     Right side of head: No submental, submandibular, tonsillar, preauricular, posterior auricular or occipital adenopathy.     Left side of head: No submental, submandibular, tonsillar, preauricular, posterior auricular or occipital adenopathy.     Cervical: No cervical adenopathy.  Skin:    General: Skin is warm and dry.  Neurological:     Mental Status: She is alert.  Psychiatric:        Speech: Speech normal.        Behavior: Behavior normal.        Thought Content: Thought content normal.        Assessment & Plan:   Problem List Items Addressed This Visit       Cardiovascular and Mediastinum   Aortic atherosclerosis ([place]) - Primary    Discussed atherosclerosis on CT a/p.  Patient's been compliant with Crestor 10 mg.  Consider  more strigent LDL goal less than 70.  Pending lipid panel today The 10-year ASCVD risk score (Jabaree Mercado DK, et al., 2019) is: 29.1%       Relevant Orders    Comprehensive metabolic panel   Lipid panel     Respiratory   Lung nodule    Pending CT chest.  Likely will refer patient to pulmonary nodule clinic once results obtained.  Patient will call to reschedule CT as it was postponed due her infection with COVID        Digestive   Liver lesion    Discussed liver lesion seen on CT abdomen pelvis.  Pending MRI abdomen        Endocrine   Hyperlipidemia associated with type 2 diabetes mellitus ([place])    Lab Results  Component Value Date   HGBA1C 6.2 (A) 05/15/2022  Excellent control and improvement.  We will continue to monitor.  Patient is not on DM medication at this time.         Other   COVID-19    Positive COVID 10 days ago.  Symptoms improving.  Afebrile ,no acute respiratory distress.  Productive cough lingering, worse at bedtime.  Patient prefers codeine-based preparation.  I provided Cheratussin and counseled her on no alcohol use with this medication.  Advised her certainly if the cough congestion were to persist or worsen to let me know.  Advised her to start Mucinex DM. I looked up patient on  Controlled Substances Reporting System PMP AWARE and saw no activity that raised concern of inappropriate use.         Relevant Medications   guaiFENesin-codeine (ROBITUSSIN AC) 100-10 MG/5ML syrup   Suprapubic pressure    Based on CT abdomen and pelvis, question SI joint source of pain.  Asymmetric sclerosis along the iliac portion right greater than left.  Referral to orthopedic for second opinion.  Referral has been placed.      Other Visit Diagnoses     Elevated glucose       Relevant Orders   POCT HgB A1C (Completed)   Cough in adult       Relevant Orders   POC COVID-19 (Completed)        I am having Jaclyn Lin start on guaiFENesin-codeine. I am also having her maintain her albuterol, budesonide-formoterol, Flowflex COVID-19 Ag Home Test, montelukast, sertraline, rosuvastatin, ALPRAZolam, and  triamterene-hydrochlorothiazide.   Meds ordered this encounter  Medications   guaiFENesin-codeine (ROBITUSSIN AC) 100-10 MG/5ML syrup    Sig: Take 5 mLs by mouth at bedtime as needed for cough.    Dispense:  70 mL    Refill:  0    Order Specific Question:   Supervising Provider    Answer:   Crecencio Mc [[id number]]    Return precautions given.   Risks, benefits, and alternatives of the medications and treatment plan prescribed today were discussed, and patient expressed understanding.   Education regarding symptom management and diagnosis given to patient on AVS.  Continue to follow with Burnard Hawthorne, FNP for routine health maintenance.   Jaclyn Lin and I agreed with plan.   Mable Paris, FNP

## 2022-05-15 NOTE — Telephone Encounter (Signed)
What is status of orthopedic referral?

## 2022-05-15 NOTE — Patient Instructions (Addendum)
CD burned from [facility] of Ct abdomen and pelvis 04/11/22  DG hips 12/11/21 DG lumbar 02/01/22- we can burn this CD here in our lab ( ask Toya)   Call [facility] for CD above and to reschedule MRI abdomen and CT Chest.   [facility]- phone number [phone]  Start mucinex DM ( DM is a cough suppressant)   I have given you Cheratussin as well.  Please take cough medication at night only as needed. As we discussed, I do not recommend dosing throughout the day as coughing is a protective mechanism . It also helps to break up thick mucous.  Do not take cough suppressants with alcohol as can lead to trouble breathing. Advise caution if taking cough suppressant and operating machinery ( i.e driving a car) as you may feel very tired.    Let me know how you are doing.

## 2022-05-15 NOTE — Assessment & Plan Note (Signed)
Discussed liver lesion seen on CT abdomen pelvis.  Pending MRI abdomen

## 2022-05-15 NOTE — Assessment & Plan Note (Signed)
Pending CT chest.  Likely will refer patient to pulmonary nodule clinic once results obtained.  Patient will call to reschedule CT as it was postponed due her infection with COVID

## 2022-05-15 NOTE — Assessment & Plan Note (Signed)
Based on CT abdomen and pelvis, question SI joint source of pain.  Asymmetric sclerosis along the iliac portion right greater than left.  Referral to orthopedic for second opinion.  Referral has been placed.

## 2022-05-15 NOTE — Assessment & Plan Note (Signed)
Positive COVID 10 days ago.  Symptoms improving.  Afebrile ,no acute respiratory distress.  Productive cough lingering, worse at bedtime.  Patient prefers codeine-based preparation.  I provided Cheratussin and counseled her on no alcohol use with this medication.  Advised her certainly if the cough congestion were to persist or worsen to let me know.  Advised her to start Mucinex DM. I looked up patient on [place] Controlled Substances Reporting System PMP AWARE and saw no activity that raised concern of inappropriate use.

## 2022-05-15 NOTE — Assessment & Plan Note (Signed)
Discussed atherosclerosis on CT a/p.  Patient's been compliant with Crestor 10 mg.  Consider  more strigent LDL goal less than 70.  Pending lipid panel today The 10-year ASCVD risk score (Jaclyn Lin, et al., 2019) is: 29.1%

## 2022-05-15 NOTE — Telephone Encounter (Signed)
noted 

## 2022-05-15 NOTE — Assessment & Plan Note (Signed)
Lab Results  Component Value Date   HGBA1C 6.2 (A) 05/15/2022   Excellent control and improvement.  We will continue to monitor.  Patient is not on DM medication at this time.

## 2022-05-24 ENCOUNTER — Ambulatory Visit: Payer: Managed Care, Other (non HMO) | Admitting: Family

## 2022-07-03 ENCOUNTER — Other Ambulatory Visit: Payer: Self-pay | Admitting: Family

## 2022-07-03 DIAGNOSIS — J309 Allergic rhinitis, unspecified: Secondary | ICD-10-CM

## 2022-07-03 DIAGNOSIS — J45909 Unspecified asthma, uncomplicated: Secondary | ICD-10-CM

## 2022-10-01 ENCOUNTER — Other Ambulatory Visit: Payer: Self-pay | Admitting: Family

## 2022-10-01 DIAGNOSIS — I10 Essential (primary) hypertension: Secondary | ICD-10-CM

## 2023-01-22 ENCOUNTER — Encounter (INDEPENDENT_AMBULATORY_CARE_PROVIDER_SITE_OTHER): Payer: Self-pay

## 2023-02-07 ENCOUNTER — Ambulatory Visit (INDEPENDENT_AMBULATORY_CARE_PROVIDER_SITE_OTHER): Payer: Managed Care, Other (non HMO) | Admitting: Family

## 2023-02-07 ENCOUNTER — Encounter: Payer: Self-pay | Admitting: Family

## 2023-02-07 ENCOUNTER — Telehealth: Payer: Self-pay | Admitting: Family

## 2023-02-07 VITALS — BP 128/82 | HR 76 | Temp 97.8°F | Ht 66.0 in | Wt 187.4 lb

## 2023-02-07 DIAGNOSIS — Z1231 Encounter for screening mammogram for malignant neoplasm of breast: Secondary | ICD-10-CM

## 2023-02-07 DIAGNOSIS — Z720 Tobacco use: Secondary | ICD-10-CM

## 2023-02-07 DIAGNOSIS — R7303 Prediabetes: Secondary | ICD-10-CM | POA: Diagnosis not present

## 2023-02-07 DIAGNOSIS — J45909 Unspecified asthma, uncomplicated: Secondary | ICD-10-CM

## 2023-02-07 DIAGNOSIS — E1169 Type 2 diabetes mellitus with other specified complication: Secondary | ICD-10-CM

## 2023-02-07 DIAGNOSIS — Z1239 Encounter for other screening for malignant neoplasm of breast: Secondary | ICD-10-CM

## 2023-02-07 DIAGNOSIS — I10 Essential (primary) hypertension: Secondary | ICD-10-CM | POA: Diagnosis not present

## 2023-02-07 DIAGNOSIS — Z1211 Encounter for screening for malignant neoplasm of colon: Secondary | ICD-10-CM

## 2023-02-07 DIAGNOSIS — Z0000 Encounter for general adult medical examination without abnormal findings: Secondary | ICD-10-CM

## 2023-02-07 DIAGNOSIS — E785 Hyperlipidemia, unspecified: Secondary | ICD-10-CM

## 2023-02-07 MED ORDER — BUDESONIDE-FORMOTEROL FUMARATE 80-4.5 MCG/ACT IN AERO: 2.0000 | INHALATION_SPRAY | Freq: Two times a day (BID) | RESPIRATORY_TRACT | 2 refills | Status: DC

## 2023-02-07 MED ORDER — ROSUVASTATIN CALCIUM 10 MG PO TABS: ORAL_TABLET | ORAL | 3 refills | Status: DC

## 2023-02-07 MED ORDER — ALBUTEROL SULFATE HFA 108 (90 BASE) MCG/ACT IN AERS: 2.0000 | INHALATION_SPRAY | Freq: Four times a day (QID) | RESPIRATORY_TRACT | 1 refills | Status: DC | PRN

## 2023-02-07 NOTE — Telephone Encounter (Signed)
Pt didn't have MR abdomen or ct chest as ordered 04/30/2022  Can you schedule?

## 2023-02-07 NOTE — Assessment & Plan Note (Signed)
Encouraged starting a walking program.  Discussed the importance of preventative screenings including mammograms colonoscopy.  Encouraged patient to schedule her mammogram.  Patient prefers to do fecal DNA test through her employer, [employer].  In the absence of change in bowel habits, family history, I advised as certainly reasonable.  Working on obtaining test number for [employer].  Patient politely declines a formal referral to pulmonology for CT lung cancer screening.  However she is overdue for CT chest to follow-up for lung nodule identified on CT abdomen pelvis.

## 2023-02-07 NOTE — Patient Instructions (Signed)
Please consider Ct lung cancer screening program.  This is amazing preventative test that is done ANNUALLY and run through [facility] pulmonology whom orders the test annually.  It is usually covered 100% by insurance.  I always advise patients to call insurance company and advance just to be doubly sure.  Screening test is designed for adults aged 57 to 80 years who have a 20 pack-year smoking history and currently smoke or have quit within the past 15 years.   Please review the below article.   [email or web address]  You may call to make an appointment for Annual Lung cancer screen  With CT Chest:  [phone]. Let me know if any issues in doing so.  Please call  and schedule your 3D mammogram and /or bone density scan as we discussed.   [facility]  ( new location in 2023)  [address], [place], [place] [address]  [place], [place]  [phone]   Health Maintenance for Postmenopausal Women Menopause is a normal process in which your ability to get pregnant comes to an end. This process happens slowly over many months or years, usually between the ages of 30 and 63. Menopause is complete when you have missed your menstrual period for 12 months. It is important to talk with your health care provider about some of the most common conditions that affect women after menopause (postmenopausal women). These include heart disease, cancer, and bone loss (osteoporosis). Adopting a healthy lifestyle and getting preventive care can help to promote your health and wellness. The actions you take can also lower your chances of developing some of these common conditions. What are the signs and symptoms of menopause? During menopause, you may have the following symptoms: Hot flashes. These can be moderate or severe. Night sweats. Decrease in sex drive. Mood swings. Headaches. Tiredness  (fatigue). Irritability. Memory problems. Problems falling asleep or staying asleep. Talk with your health care provider about treatment options for your symptoms. Do I need hormone replacement therapy? Hormone replacement therapy is effective in treating symptoms that are caused by menopause, such as hot flashes and night sweats. Hormone replacement carries certain risks, especially as you become older. If you are thinking about using estrogen or estrogen with progestin, discuss the benefits and risks with your health care provider. How can I reduce my risk for heart disease and stroke? The risk of heart disease, heart attack, and stroke increases as you age. One of the causes may be a change in the body's hormones during menopause. This can affect how your body uses dietary fats, triglycerides, and cholesterol. Heart attack and stroke are medical emergencies. There are many things that you can do to help prevent heart disease and stroke. Watch your blood pressure High blood pressure causes heart disease and increases the risk of stroke. This is more likely to develop in people who have high blood pressure readings or are overweight. Have your blood pressure checked: Every 3-5 years if you are 30-58 years of age. Every year if you are 28 years old or older. Eat a healthy diet  Eat a diet that includes plenty of vegetables, fruits, low-fat dairy products, and lean protein. Do not eat a lot of foods that are high in solid fats, added sugars, or sodium. Get regular exercise Get regular exercise. This is one of the most important things you can do for your health. Most adults should: Try to exercise for at least 150 minutes each week. The exercise should increase your  heart rate and make you sweat (moderate-intensity exercise). Try to do strengthening exercises at least twice each week. Do these in addition to the moderate-intensity exercise. Spend less time sitting. Even light physical activity  can be beneficial. Other tips Work with your health care provider to achieve or maintain a healthy weight. Do not use any products that contain nicotine or tobacco. These products include cigarettes, chewing tobacco, and vaping devices, such as e-cigarettes. If you need help quitting, ask your health care provider. Know your numbers. Ask your health care provider to check your cholesterol and your blood sugar (glucose). Continue to have your blood tested as directed by your health care provider. Do I need screening for cancer? Depending on your health history and family history, you may need to have cancer screenings at different stages of your life. This may include screening for: Breast cancer. Cervical cancer. Lung cancer. Colorectal cancer. What is my risk for osteoporosis? After menopause, you may be at increased risk for osteoporosis. Osteoporosis is a condition in which bone destruction happens more quickly than new bone creation. To help prevent osteoporosis or the bone fractures that can happen because of osteoporosis, you may take the following actions: If you are 25-25 years old, get at least 1,000 mg of calcium and at least 600 international units (IU) of vitamin D per day. If you are older than age 72 but younger than age 3, get at least 1,200 mg of calcium and at least 600 international units (IU) of vitamin D per day. If you are older than age 36, get at least 1,200 mg of calcium and at least 800 international units (IU) of vitamin D per day. Smoking and drinking excessive alcohol increase the risk of osteoporosis. Eat foods that are rich in calcium and vitamin D, and do weight-bearing exercises several times each week as directed by your health care provider. How does menopause affect my mental health? Depression may occur at any age, but it is more common as you become older. Common symptoms of depression include: Feeling depressed. Changes in sleep patterns. Changes in  appetite or eating patterns. Feeling an overall lack of motivation or enjoyment of activities that you previously enjoyed. Frequent crying spells. Talk with your health care provider if you think that you are experiencing any of these symptoms. General instructions See your health care provider for regular wellness exams and vaccines. This may include: Scheduling regular health, dental, and eye exams. Getting and maintaining your vaccines. These include: Influenza vaccine. Get this vaccine each year before the flu season begins. Pneumonia vaccine. Shingles vaccine. Tetanus, diphtheria, and pertussis (Tdap) booster vaccine. Your health care provider may also recommend other immunizations. Tell your health care provider if you have ever been abused or do not feel safe at home. Summary Menopause is a normal process in which your ability to get pregnant comes to an end. This condition causes hot flashes, night sweats, decreased interest in sex, mood swings, headaches, or lack of sleep. Treatment for this condition may include hormone replacement therapy. Take actions to keep yourself healthy, including exercising regularly, eating a healthy diet, watching your weight, and checking your blood pressure and blood sugar levels. Get screened for cancer and depression. Make sure that you are up to date with all your vaccines. This information is not intended to replace advice given to you by your health care provider. Make sure you discuss any questions you have with your health care provider. Document Revised: 10/30/2020 Document Reviewed: 10/30/2020 Elsevier  Patient Education  2024 [employer].

## 2023-02-07 NOTE — Progress Notes (Signed)
Assessment & Plan:  Routine general medical examination at a health care facility Assessment & Plan: Encouraged starting a walking program.  Discussed the importance of preventative screenings including mammograms colonoscopy.  Encouraged patient to schedule her mammogram.  Patient prefers to do fecal DNA test through her employer, [employer].  In the absence of change in bowel habits, family history, I advised as certainly reasonable.  Working on obtaining test number for [facility].  Patient politely declines a formal referral to pulmonology for CT lung cancer screening.  However she is overdue for CT chest to follow-up for lung nodule identified on CT abdomen pelvis.    Orders: -     CBC with Differential/Platelet; Future -     Comprehensive metabolic panel; Future -     Lipid panel; Future -     TSH; Future -     3D Screening Mammogram, Left and Right; Future -     Amb Referral to Colonoscopy -     Hemoglobin A1c; Future -     Microalbumin / creatinine urine ratio; Future -     Rosuvastatin Calcium; Take one tablet by mouth every evening.  Dispense: 90 tablet; Refill: 3  Primary hypertension -     CBC with Differential/Platelet; Future -     Comprehensive metabolic panel; Future  Hyperlipidemia associated with type 2 diabetes mellitus (HCC) -     Lipid panel; Future -     TSH; Future  Screen for colon cancer -     Amb Referral to Colonoscopy  Breast screening -     3D Screening Mammogram, Left and Right; Future  Encounter for screening mammogram for malignant neoplasm of breast  Prediabetes -     Hemoglobin A1c; Future  Tobacco use -     Albuterol Sulfate HFA; Inhale 2 puffs into the lungs every 6 (six) hours as needed for wheezing or shortness of breath.  Dispense: 1 each; Refill: 1  Uncomplicated asthma, unspecified asthma severity, unspecified whether persistent -     Budesonide-Formoterol Fumarate; Inhale 2 puffs into the lungs 2 (two) times daily.  Dispense: 1 each;  Refill: 2     Return precautions given.   Risks, benefits, and alternatives of the medications and treatment plan prescribed today were discussed, and patient expressed understanding.   Education regarding symptom management and diagnosis given to patient on AVS either electronically or printed.  Return in about 2 months (around 04/09/2023) for Fasting labs in 2-3 weeks.  Rennie Plowman, FNP  Subjective:    Patient ID: Jaclyn Lin, female    DOB: 1965/09/06, 57 y.o.   MRN: [id number]  CC: Jaclyn Lin is a 57 y.o. female who presents today for physical exam.    HPI: Feels well today.  No new complaints.    Colorectal Cancer Screening: overdue; she denies constipation, rectal bleeding.  She would prefer DNA stool test.   Breast Cancer Screening: Mammogram overdue Cervical Cancer Screening: partial hysterectomy. Dr. 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  No cervix on exam 02/01/2022.  Pap collected of the vaginal wall   Bone Health screening/DEXA for 65+: No increased fracture risk. Defer screening at this time  Lung Cancer Screening: Previously referred to pulmonology for CT lung cancer screening          Tetanus - UTD         Exercise: No regular exercise.   Alcohol use: Occasional Smoking/tobacco use: smoker.    Health Maintenance  Topic Date Due   Complete foot exam   Never done   Eye exam for diabetics  Never done   Colon Cancer Screening  Never done   Zoster (Shingles) Vaccine (1 of 2) Never done   Yearly kidney health urinalysis for diabetes  01/29/2019   Screening for Lung Cancer  03/10/2019   Mammogram  05/24/2021   COVID-19 Vaccine (3 - 2023-24 season) 02/22/2022   Hemoglobin A1C  11/13/2022   Flu Shot  01/23/2023   Yearly kidney function blood test for diabetes  05/16/2023   DTaP/Tdap/Td vaccine (2 - Td or Tdap) 09/03/2028   Hepatitis C Screening  Completed   HIV Screening  Completed   HPV  Vaccine  Aged Out    ALLERGIES: Codeine  Current Outpatient Medications on File Prior to Visit  Medication Sig Dispense Refill   ALPRAZolam (XANAX) 0.25 MG tablet Take 1 tablet (0.25 mg total) by mouth daily as needed for anxiety or sleep. 30 tablet 1   FLOWFLEX COVID-19 AG HOME TEST KIT      montelukast (SINGULAIR) 10 MG tablet TAKE 1 TABLET BY MOUTH EVERY DAY 90 tablet 3   triamterene-hydrochlorothiazide (DYAZIDE) 37.5-25 MG capsule TAKE 1 CAPSULE BY MOUTH EVERY DAY 90 capsule 1   No current facility-administered medications on file prior to visit.    Review of Systems  Constitutional:  Negative for chills and fever.  Respiratory:  Negative for cough.   Cardiovascular:  Negative for chest pain and palpitations.  Gastrointestinal:  Negative for nausea and vomiting.      Objective:    BP 128/82   Pulse 76   Temp 97.8 F (36.6 C)   Ht 5\' 6"  (1.676 m)   Wt 187 lb 6.4 oz (85 kg)   SpO2 96%   BMI 30.25 kg/m   BP Readings from Last 3 Encounters:  02/07/23 128/82  05/15/22 130/72  04/03/22 130/78   Wt Readings from Last 3 Encounters:  02/07/23 187 lb 6.4 oz (85 kg)  05/15/22 183 lb (83 kg)  04/03/22 187 lb 3.2 oz (84.9 kg)    Physical Exam Vitals reviewed.  Constitutional:      Appearance: Normal appearance. She is well-developed.  Eyes:     Conjunctiva/sclera: Conjunctivae normal.  Neck:     Thyroid: No thyroid mass or thyromegaly.  Cardiovascular:     Rate and Rhythm: Normal rate and regular rhythm.     Pulses: Normal pulses.     Heart sounds: Normal heart sounds.  Pulmonary:     Effort: Pulmonary effort is normal.     Breath sounds: Normal breath sounds. No wheezing, rhonchi or rales.  Chest:  Breasts:    Breasts are symmetrical.     Right: No inverted nipple, mass, nipple discharge, skin change or tenderness.     Left: No inverted nipple, mass, nipple discharge, skin change or tenderness.  Abdominal:     General: Bowel sounds are normal. There is no  distension.     Palpations: Abdomen is soft. Abdomen is not rigid. There is no fluid wave or mass.     Tenderness: There is no abdominal tenderness. There is no guarding or rebound.  Lymphadenopathy:     Head:     Right side of head: No submental, submandibular, tonsillar, preauricular, posterior auricular or occipital adenopathy.     Left side of head: No submental, submandibular, tonsillar, preauricular, posterior auricular or occipital adenopathy.     Cervical: No cervical adenopathy.     Right  cervical: No superficial, deep or posterior cervical adenopathy.    Left cervical: No superficial, deep or posterior cervical adenopathy.  Skin:    General: Skin is warm and dry.  Neurological:     Mental Status: She is alert.  Psychiatric:        Speech: Speech normal.        Behavior: Behavior normal.        Thought Content: Thought content normal.

## 2023-02-17 ENCOUNTER — Ambulatory Visit: Payer: Managed Care, Other (non HMO)

## 2023-02-17 ENCOUNTER — Telehealth: Payer: Self-pay | Admitting: Family

## 2023-02-17 ENCOUNTER — Ambulatory Visit
Admission: RE | Admit: 2023-02-17 | Discharge: 2023-02-17 | Disposition: A | Discharge status: Home / Self Care | Payer: Managed Care, Other (non HMO) | Source: Ambulatory Visit | Attending: Family | Admitting: Family

## 2023-02-17 ENCOUNTER — Other Ambulatory Visit: Payer: Managed Care, Other (non HMO)

## 2023-02-17 ENCOUNTER — Ambulatory Visit
Admission: RE | Admit: 2023-02-17 | Discharge: 2023-02-17 | Disposition: A | Discharge status: Home / Self Care | Payer: Managed Care, Other (non HMO) | Source: Ambulatory Visit | Attending: Family

## 2023-02-17 DIAGNOSIS — R911 Solitary pulmonary nodule: Secondary | ICD-10-CM

## 2023-02-17 DIAGNOSIS — K769 Liver disease, unspecified: Secondary | ICD-10-CM

## 2023-02-17 DIAGNOSIS — Z1211 Encounter for screening for malignant neoplasm of colon: Secondary | ICD-10-CM

## 2023-02-17 MED ORDER — GADOBUTROL 1 MMOL/ML IV SOLN
8.0000 mL | Freq: Once | INTRAVENOUS | Status: AC | PRN
  Administered 2023-02-17: 8 mL via INTRAVENOUS

## 2023-02-17 NOTE — Telephone Encounter (Signed)
I dont see scheduled as of yet, Need to schedule MR abdomen AND ct chest

## 2023-02-17 NOTE — Telephone Encounter (Signed)
LVM to inform pt that Cologuard has been ordered and also she needs to call the office if she does not receive the kit in the next 2 weeks. Please ask her to follow directions and mail kit back to company

## 2023-02-17 NOTE — Telephone Encounter (Signed)
Call patient I was informed from gastroenterology that patient would prefer to have cologuard over colonoscopy.  Cologuard is for patients that are low risk and not having any rectal bleeding, changes in bowel habits  Please order Cologuard   ask her to call the office if she does not receive the kit in the next 2 weeks.  Please ask her to follow directions and mail kit back to company.

## 2023-02-17 NOTE — Telephone Encounter (Signed)
Pt returned [facility] CMA call. Note below was read to her. Pt aware and understood.  Pt also stated that she will like to get a sleep apnea test. Please advice.

## 2023-02-18 NOTE — Telephone Encounter (Signed)
Would you like for pt to schedule an appt to discuss the need for a sleep study?

## 2023-02-21 NOTE — Telephone Encounter (Signed)
Patient states she is experiencing really loud snoring and stops breathing while sleeping, per Mom and sister.

## 2023-02-21 NOTE — Telephone Encounter (Signed)
NOTED

## 2023-02-21 NOTE — Telephone Encounter (Signed)
LVM to call back to office  

## 2023-02-25 ENCOUNTER — Other Ambulatory Visit: Payer: Self-pay | Admitting: Family

## 2023-02-25 DIAGNOSIS — R911 Solitary pulmonary nodule: Secondary | ICD-10-CM

## 2023-02-25 DIAGNOSIS — R5382 Chronic fatigue, unspecified: Secondary | ICD-10-CM

## 2023-02-25 DIAGNOSIS — Z122 Encounter for screening for malignant neoplasm of respiratory organs: Secondary | ICD-10-CM

## 2023-02-25 NOTE — Telephone Encounter (Signed)
Pt has been notified.

## 2023-02-27 ENCOUNTER — Ambulatory Visit
Admission: RE | Admit: 2023-02-27 | Discharge: 2023-02-27 | Disposition: A | Discharge status: Home / Self Care | Payer: Managed Care, Other (non HMO) | Source: Ambulatory Visit | Attending: Family | Admitting: Family

## 2023-02-27 DIAGNOSIS — Z0000 Encounter for general adult medical examination without abnormal findings: Secondary | ICD-10-CM | POA: Insufficient documentation

## 2023-02-27 DIAGNOSIS — Z1231 Encounter for screening mammogram for malignant neoplasm of breast: Secondary | ICD-10-CM | POA: Insufficient documentation

## 2023-02-27 DIAGNOSIS — Z1239 Encounter for other screening for malignant neoplasm of breast: Secondary | ICD-10-CM | POA: Diagnosis present

## 2023-02-28 ENCOUNTER — Other Ambulatory Visit (INDEPENDENT_AMBULATORY_CARE_PROVIDER_SITE_OTHER): Payer: Managed Care, Other (non HMO)

## 2023-02-28 DIAGNOSIS — R7303 Prediabetes: Secondary | ICD-10-CM

## 2023-02-28 DIAGNOSIS — I10 Essential (primary) hypertension: Secondary | ICD-10-CM

## 2023-02-28 DIAGNOSIS — Z0000 Encounter for general adult medical examination without abnormal findings: Secondary | ICD-10-CM

## 2023-02-28 DIAGNOSIS — E1169 Type 2 diabetes mellitus with other specified complication: Secondary | ICD-10-CM

## 2023-02-28 NOTE — Telephone Encounter (Signed)
Has been taken care of.  

## 2023-03-01 LAB — CBC WITH DIFFERENTIAL/PLATELET
Basophils Absolute: 0.1 10*3/uL (ref 0.0–0.2)
Basos: 1 %
EOS (ABSOLUTE): 0.1 10*3/uL (ref 0.0–0.4)
Eos: 2 %
Hematocrit: 41 % (ref 34.0–46.6)
Hemoglobin: 12.8 g/dL (ref 11.1–15.9)
Immature Grans (Abs): 0.1 10*3/uL (ref 0.0–0.1)
Immature Granulocytes: 1 %
Lymphocytes Absolute: 2.7 10*3/uL (ref 0.7–3.1)
Lymphs: 38 %
MCH: 26.8 pg (ref 26.6–33.0)
MCHC: 31.2 g/dL — ABNORMAL LOW (ref 31.5–35.7)
MCV: 86 fL (ref 79–97)
Monocytes Absolute: 0.6 10*3/uL (ref 0.1–0.9)
Monocytes: 8 %
Neutrophils Absolute: 3.6 10*3/uL (ref 1.4–7.0)
Neutrophils: 50 %
Platelets: 264 10*3/uL (ref 150–450)
RBC: 4.78 x10E6/uL (ref 3.77–5.28)
RDW: 13.7 % (ref 11.7–15.4)
WBC: 7.1 10*3/uL (ref 3.4–10.8)

## 2023-03-01 LAB — COMPREHENSIVE METABOLIC PANEL
ALT: 13 IU/L (ref 0–32)
AST: 13 IU/L (ref 0–40)
Albumin: 4.3 g/dL (ref 3.8–4.9)
Alkaline Phosphatase: 72 IU/L (ref 44–121)
BUN/Creatinine Ratio: 26 — ABNORMAL HIGH (ref 9–23)
BUN: 18 mg/dL (ref 6–24)
Bilirubin Total: 0.2 mg/dL (ref 0.0–1.2)
CO2: 24 mmol/L (ref 20–29)
Calcium: 9.7 mg/dL (ref 8.7–10.2)
Chloride: 104 mmol/L (ref 96–106)
Creatinine, Ser: 0.69 mg/dL (ref 0.57–1.00)
Globulin, Total: 2.5 g/dL (ref 1.5–4.5)
Glucose: 110 mg/dL — ABNORMAL HIGH (ref 70–99)
Potassium: 3.7 mmol/L (ref 3.5–5.2)
Sodium: 143 mmol/L (ref 134–144)
Total Protein: 6.8 g/dL (ref 6.0–8.5)
eGFR: 101 mL/min/{1.73_m2} (ref >59)

## 2023-03-01 LAB — HEMOGLOBIN A1C
Est. average glucose Bld gHb Est-mCnc: 143 mg/dL
Hgb A1c MFr Bld: 6.6 % — ABNORMAL HIGH (ref 4.8–5.6)

## 2023-03-01 LAB — LIPID PANEL
Chol/HDL Ratio: 4.6 ratio — ABNORMAL HIGH (ref 0.0–4.4)
Cholesterol, Total: 195 mg/dL (ref 100–199)
HDL: 42 mg/dL (ref >39)
LDL Chol Calc (NIH): 122 mg/dL — ABNORMAL HIGH (ref 0–99)
Triglycerides: 173 mg/dL — ABNORMAL HIGH (ref 0–149)
VLDL Cholesterol Cal: 31 mg/dL (ref 5–40)

## 2023-03-01 LAB — TSH: TSH: 1.28 u[IU]/mL (ref 0.450–4.500)

## 2023-03-02 LAB — MICROALBUMIN / CREATININE URINE RATIO
Creatinine, Urine: 173.8 mg/dL
Microalb/Creat Ratio: 77 mg/g{creat} — ABNORMAL HIGH (ref 0–29)
Microalbumin, Urine: 134 ug/mL

## 2023-03-03 NOTE — Group Note (Deleted)

## 2023-03-06 ENCOUNTER — Encounter: Payer: Self-pay | Admitting: *Deleted

## 2023-03-12 NOTE — Progress Notes (Unsigned)
Assessment & Plan:  There are no diagnoses linked to this encounter.   Return precautions given.   Risks, benefits, and alternatives of the medications and treatment plan prescribed today were discussed, and patient expressed understanding.   Education regarding symptom management and diagnosis given to patient on AVS either electronically or printed.  No follow-ups on file.  Rennie Plowman, FNP  Subjective:    Patient ID: Jaclyn Lin, female    DOB: 1965-09-28, 57 y.o.   MRN: [id number]  CC: Jaclyn Lin is a 57 y.o. female who presents today for follow up.   HPI: Follow-up to discuss labs, hyperlipidemia, diabetes      Allergies: Codeine Current Outpatient Medications on File Prior to Visit  Medication Sig Dispense Refill   albuterol (VENTOLIN HFA) 108 (90 Base) MCG/ACT inhaler Inhale 2 puffs into the lungs every 6 (six) hours as needed for wheezing or shortness of breath. 1 each 1   ALPRAZolam (XANAX) 0.25 MG tablet Take 1 tablet (0.25 mg total) by mouth daily as needed for anxiety or sleep. 30 tablet 1   budesonide-formoterol (SYMBICORT) 80-4.5 MCG/ACT inhaler Inhale 2 puffs into the lungs 2 (two) times daily. 1 each 2   FLOWFLEX COVID-19 AG HOME TEST KIT      montelukast (SINGULAIR) 10 MG tablet TAKE 1 TABLET BY MOUTH EVERY DAY 90 tablet 3   rosuvastatin (CRESTOR) 10 MG tablet Take one tablet by mouth every evening. 90 tablet 3   triamterene-hydrochlorothiazide (DYAZIDE) 37.5-25 MG capsule TAKE 1 CAPSULE BY MOUTH EVERY DAY 90 capsule 1   No current facility-administered medications on file prior to visit.    Review of Systems    Objective:    There were no vitals taken for this visit. BP Readings from Last 3 Encounters:  02/07/23 128/82  05/15/22 130/72  04/03/22 130/78   Wt Readings from Last 3 Encounters:  02/07/23 187 lb 6.4 oz (85 kg)  05/15/22 183 lb (83 kg)  04/03/22 187 lb 3.2 oz (84.9 kg)    Physical Exam

## 2023-03-13 ENCOUNTER — Encounter: Payer: Self-pay | Admitting: Family

## 2023-03-13 ENCOUNTER — Ambulatory Visit: Payer: Managed Care, Other (non HMO) | Admitting: Family

## 2023-03-13 VITALS — BP 125/80 | HR 86 | Temp 98.6°F | Ht 66.0 in | Wt 190.4 lb

## 2023-03-13 DIAGNOSIS — E1169 Type 2 diabetes mellitus with other specified complication: Secondary | ICD-10-CM | POA: Diagnosis not present

## 2023-03-13 DIAGNOSIS — E118 Type 2 diabetes mellitus with unspecified complications: Secondary | ICD-10-CM

## 2023-03-13 DIAGNOSIS — E785 Hyperlipidemia, unspecified: Secondary | ICD-10-CM

## 2023-03-13 DIAGNOSIS — I10 Essential (primary) hypertension: Secondary | ICD-10-CM

## 2023-03-13 DIAGNOSIS — R519 Headache, unspecified: Secondary | ICD-10-CM

## 2023-03-13 MED ORDER — EMPAGLIFLOZIN 10 MG PO TABS: 10.0000 mg | ORAL_TABLET | Freq: Every day | ORAL | 2 refills | Status: DC

## 2023-03-13 NOTE — Patient Instructions (Addendum)
It is imperative that you are seen AT least twice per year for labs and monitoring. Monitor blood pressure at home and me 5-6 reading on separate days. Goal is less than 120/80, based on newest guidelines, however we certainly want to be less than 130/80;  if persistently higher, please make sooner follow up appointment so we can recheck you blood pressure and manage/ adjust medications.   Please download Myfitness Pal App ( basic version is free).   You may log every thing you eat for even 2-3 days to get a better of idea of total daily calories. To loose weight, we have to create caloric deficit to loose weight. The goal is 1-2 lbs per week of weight loss.   Excellent article below from [facility].   https://[email or web address].it  Calorie counting made easy  Eat less, exercise more. If only it were that simple! As most dieters know, losing weight can be very challenging. As this report details, a range of influences can affect how people gain and lose weight. But a basic understanding of how to tip your energy balance in favor of weight loss is a good place to start.  Start by determining how many calories you should consume each day. To do so, you need to know how many calories you need to maintain your current weight. Doing this requires a few simple calculations.  First, multiply your current weight by 15 -- that's roughly the number of calories per pound of body weight needed to maintain your current weight if you are moderately active. Moderately active means getting at least 30 minutes of physical activity a day in the form of exercise (walking at a brisk pace, climbing stairs, or active gardening). Let's say you're a woman who is 5 feet, 4 inches tall and weighs 155 pounds, and you need to lose about 15 pounds to put you in a healthy weight range. If you multiply 155 by 15, you will get 2,325, which is the number of calories per day that you  need in order to maintain your current weight (weight-maintenance calories). To lose weight, you will need to get below that total.  For example, to lose 1 to 2 pounds a week -- a rate that experts consider safe -- your food consumption should provide 500 to 1,000 calories less than your total weight-maintenance calories. If you need 2,325 calories a day to maintain your current weight, reduce your daily calories to between 1,325 and 1,825. If you are sedentary, you will also need to build more activity into your day. In order to lose at least a pound a week, try to do at least 30 minutes of physical activity on most days, and reduce your daily calorie intake by at least 500 calories. However, calorie intake should not fall below 1,200 a day in women or 1,500 a day in men, except under the supervision of a health professional. Eating too few calories can endanger your health by depriving you of needed nutrients.  Meeting your calorie target How can you meet your daily calorie target? One approach is to add up the number of calories per serving of all the foods that you eat, and then plan your menus accordingly. You can buy books that list calories per serving for many foods. In addition, the nutrition labels on all packaged foods and beverages provide calories per serving information. Make a point of reading the labels of the foods and drinks you use, noting the number of calories and the serving  sizes. Many recipes published in cookbooks, newspapers, and magazines provide similar information.  If you hate counting calories, a different approach is to restrict how much and how often you eat, and to eat meals that are low in calories. Dietary guidelines issued by the American Heart Association stress common sense in choosing your foods rather than focusing strictly on numbers, such as total calories or calories from fat. Whichever method you choose, research shows that a regular eating schedule -- with meals  and snacks planned for certain times each day -- makes for the most successful approach. The same applies after you have lost weight and want to keep it off. Sticking with an eating schedule increases your chance of maintaining your new weight.    This is  Dr. Melina Schools  ( an amazing physician in my office!)  example of a  "Low GI"  Diet:  It will allow you to lose 4 to 8  lbs  per month if you follow it carefully.  Your goal with exercise is a minimum of 30 minutes of aerobic exercise 5 days per week (Walking does not count once it becomes easy!)    All of the foods can be found at grocery stores and in bulk at [employer].  The Atkins protein bars and shakes are available in more varieties at Target, [employer] and [employer].     7 AM Breakfast:  Choose from the following:  Low carbohydrate Protein  Shakes (I recommend the  Premier Protein chocolate shakes,  EAS AdvantEdge "Carb Control" shakes  Or the Atkins shakes all are under 3 net carbs)     a scrambled egg/bacon/cheese burrito made with Mission's "carb balance" whole wheat tortilla  (about 10 net carbs )  Medical laboratory scientific officer (basically a quiche without the pastry crust) that is eaten cold and very convenient way to get your eggs.  8 carbs)  If you make your own protein shakes, avoid bananas and pineapple,  And use low carb greek yogurt or original /unsweetened almond or soy milk    Avoid cereal and bananas, oatmeal and cream of wheat and grits. They are loaded with carbohydrates!   10 AM: high protein snack:  Protein bar by Atkins (the snack size, under 200 cal, usually < 6 net carbs).    A stick of cheese:  Around 1 carb,  100 cal     Dannon Light n Fit [place] Yogurt  (80 cal, 8 carbs)  Other so called "protein bars" and Greek yogurts tend to be loaded with carbohydrates.  Remember, in food advertising, the word "energy" is synonymous for " carbohydrate."  Lunch:   A Sandwich using the bread choices listed, Can use  any  Eggs,  lunchmeat, grilled meat or canned tuna), avocado, regular mayo/mustard  and cheese.  A Salad using blue cheese, ranch,  Goddess or vinagrette,  Avoid taco shells, croutons or "confetti" and no "candied nuts" but regular nuts OK.   No pretzels, nabs  or chips.  Pickles and miniature sweet peppers are a good low carb alternative that provide a "crunch"  The bread is the only source of carbohydrate in a sandwich and  can be decreased by trying some of the attached alternatives to traditional loaf bread   Avoid "Low fat dressings, as well as Reyne Dumas and [employer] dressings They are loaded with sugar!   3 PM/ Mid day  Snack:  Consider  1 ounce of  almonds, walnuts, pistachios, pecans, peanuts,  Macadamia nuts or a nut medley.  Avoid "granola and granola bars "  Mixed nuts are ok in moderation as long as there are no raisins,  cranberries or dried fruit.   KIND bars are OK if you get the low glycemic index variety   Try the prosciutto/mozzarella cheese sticks by [employer]  In deli /backery section   High protein      6 PM  Dinner:     Meat/fowl/fish with a green salad, and either broccoli, cauliflower, green beans, spinach, brussel sprouts or  Lima beans. DO NOT BREAD THE PROTEIN!!      There is a low carb pasta by Dreamfield's that is acceptable and tastes great: only 5 digestible carbs/serving.( All grocery stores but BJs carry it ) Several ready made meals are available low carb:   Try Michel Angelo's chicken piccata or chicken or eggplant parm over low carb pasta.(Lowes and BJs)   Clifton Custard Sanchez's "Carnitas" (pulled pork, no sauce,  0 carbs) or his beef pot roast to make a dinner burrito (at [employer]J's)  Pesto over low carb pasta (bj's sells a good quality pesto in the center refrigerated section of the deli   Try satueeing  Roosvelt Harps with mushroooms as a good side   [employer] Giant makes a mashed cauliflower that tastes like mashed potatoes  Whole wheat pasta is still full of  digestible carbs and  Not as low in glycemic index as Dreamfield's.   Brown rice is still rice,  So skip the rice and noodles if you eat [place] or [place] (or at least limit to 1/2 cup)  9 PM snack :   Breyer's "low carb" fudgsicle or  ice cream bar (Carb Smart line), or  Weight Watcher's ice cream bar , or another "no sugar added" ice cream;  a serving of fresh berries/cherries with whipped cream   Cheese or DANNON'S LlGHT N FIT GREEK YOGURT  8 ounces of [place] unsweetened almond/cococunut milk    Treat yourself to a parfait made with whipped cream blueberiies, walnuts and vanilla greek yogurt  Avoid bananas, pineapple, grapes  and watermelon on a regular basis because they are high in sugar.  THINK OF THEM AS DESSERT  Remember that snack Substitutions should be less than 10 NET carbs per serving and meals < 20 carbs. Remember to subtract fiber grams to get the "net carbs."

## 2023-03-13 NOTE — Assessment & Plan Note (Signed)
Controlled. Continue dyazide 37.5- 25mg 

## 2023-03-13 NOTE — Assessment & Plan Note (Signed)
No alarm features at this time.  Patient is not concerned with headache.  She states she will take Tylenol when she gets home and let me know if headache is not resolved.  Will follow

## 2023-03-13 NOTE — Assessment & Plan Note (Addendum)
Lab Results  Component Value Date   HGBA1C 6.6 (H) 02/28/2023   Complicated by hyperlipidemia, proteinuria.  Patient is uncomfortable with starting GLP-1 agonist.  Metformin patient had intolerance to.  We discussed trial of Jardiance.  Counseled on side effects, dehydration , risk of yeast infection, UTI.  referral to nutritionist.  Close follow up.

## 2023-03-13 NOTE — Assessment & Plan Note (Signed)
Lab Results  Component Value Date   LDLCALC 122 (H) 02/28/2023   Uncontrolled.  Patient has resumed Crestor 10 mg

## 2023-03-25 ENCOUNTER — Encounter: Payer: Self-pay | Admitting: Family

## 2023-03-25 LAB — COLOGUARD: COLOGUARD: NEGATIVE

## 2023-03-26 ENCOUNTER — Other Ambulatory Visit: Payer: Self-pay

## 2023-03-26 DIAGNOSIS — R350 Frequency of micturition: Secondary | ICD-10-CM

## 2023-03-30 ENCOUNTER — Other Ambulatory Visit: Payer: Self-pay | Admitting: Family

## 2023-03-30 DIAGNOSIS — I10 Essential (primary) hypertension: Secondary | ICD-10-CM

## 2023-04-02 ENCOUNTER — Other Ambulatory Visit: Payer: Self-pay | Admitting: Family

## 2023-04-02 DIAGNOSIS — Z720 Tobacco use: Secondary | ICD-10-CM

## 2023-04-09 ENCOUNTER — Ambulatory Visit (INDEPENDENT_AMBULATORY_CARE_PROVIDER_SITE_OTHER): Payer: Managed Care, Other (non HMO) | Admitting: Family

## 2023-04-09 ENCOUNTER — Encounter: Payer: Self-pay | Admitting: Family

## 2023-04-09 VITALS — BP 130/70 | HR 78 | Temp 98.4°F | Ht 66.0 in | Wt 189.2 lb

## 2023-04-09 DIAGNOSIS — E1169 Type 2 diabetes mellitus with other specified complication: Secondary | ICD-10-CM

## 2023-04-09 DIAGNOSIS — E785 Hyperlipidemia, unspecified: Secondary | ICD-10-CM

## 2023-04-09 DIAGNOSIS — F419 Anxiety disorder, unspecified: Secondary | ICD-10-CM | POA: Diagnosis not present

## 2023-04-09 DIAGNOSIS — J45909 Unspecified asthma, uncomplicated: Secondary | ICD-10-CM | POA: Diagnosis not present

## 2023-04-09 DIAGNOSIS — E118 Type 2 diabetes mellitus with unspecified complications: Secondary | ICD-10-CM

## 2023-04-09 DIAGNOSIS — Z7984 Long term (current) use of oral hypoglycemic drugs: Secondary | ICD-10-CM

## 2023-04-09 DIAGNOSIS — J309 Allergic rhinitis, unspecified: Secondary | ICD-10-CM

## 2023-04-09 LAB — MICROALBUMIN / CREATININE URINE RATIO
Creatinine,U: 48.7 mg/dL
Microalb Creat Ratio: 3.9 mg/g (ref 0.0–30.0)
Microalb, Ur: 1.9 mg/dL (ref 0.0–1.9)

## 2023-04-09 LAB — LIPID PANEL
Cholesterol: 161 mg/dL (ref 0–200)
HDL: 43.6 mg/dL (ref >39.00)
LDL Cholesterol: 92 mg/dL (ref 0–99)
NonHDL: 117.62
Total CHOL/HDL Ratio: 4
Triglycerides: 129 mg/dL (ref 0.0–149.0)
VLDL: 25.8 mg/dL (ref 0.0–40.0)

## 2023-04-09 LAB — COMPREHENSIVE METABOLIC PANEL
ALT: 14 U/L (ref 0–35)
AST: 16 U/L (ref 0–37)
Albumin: 4.5 g/dL (ref 3.5–5.2)
Alkaline Phosphatase: 66 U/L (ref 39–117)
BUN: 14 mg/dL (ref 6–23)
CO2: 29 meq/L (ref 19–32)
Calcium: 10.1 mg/dL (ref 8.4–10.5)
Chloride: 103 meq/L (ref 96–112)
Creatinine, Ser: 0.74 mg/dL (ref 0.40–1.20)
GFR: 89.59 mL/min (ref >60.00)
Glucose, Bld: 100 mg/dL — ABNORMAL HIGH (ref 70–99)
Potassium: 3.5 meq/L (ref 3.5–5.1)
Sodium: 140 meq/L (ref 135–145)
Total Bilirubin: 0.4 mg/dL (ref 0.2–1.2)
Total Protein: 7.7 g/dL (ref 6.0–8.3)

## 2023-04-09 MED ORDER — ALPRAZOLAM 0.25 MG PO TABS: 0.2500 mg | ORAL_TABLET | Freq: Every day | ORAL | 1 refills | Status: AC | PRN

## 2023-04-09 MED ORDER — EMPAGLIFLOZIN 10 MG PO TABS: 10.0000 mg | ORAL_TABLET | Freq: Every day | ORAL | 3 refills | Status: DC

## 2023-04-09 MED ORDER — BUDESONIDE-FORMOTEROL FUMARATE 80-4.5 MCG/ACT IN AERO: 2.0000 | INHALATION_SPRAY | Freq: Two times a day (BID) | RESPIRATORY_TRACT | 2 refills | Status: DC

## 2023-04-09 MED ORDER — MONTELUKAST SODIUM 10 MG PO TABS: 10.0000 mg | ORAL_TABLET | Freq: Every day | ORAL | 3 refills | Status: DC

## 2023-04-09 NOTE — Assessment & Plan Note (Addendum)
Lab Results  Component Value Date   HGBA1C 6.6 (H) 02/28/2023   Complicated by hyperlipidemia, proteinuria. Continue jardiance 10mg . Pending urine microalbumin today. She has also resumed crestor 10mg ; pending lipid panel today.

## 2023-04-09 NOTE — Assessment & Plan Note (Signed)
Very rare use of xanax 0.25mg . I have refilled today.

## 2023-04-09 NOTE — Progress Notes (Signed)
Assessment & Plan:  Controlled type 2 diabetes mellitus with complication, without long-term current use of insulin (HCC) Assessment & Plan: Lab Results  Component Value Date   HGBA1C 6.6 (H) 02/28/2023   Complicated by hyperlipidemia, proteinuria. Continue jardiance 10mg . Pending urine microalbumin today. She has also resumed crestor 10mg ; pending lipid panel today.   Orders: -     Microalbumin / creatinine urine ratio  Anxiety Assessment & Plan: Very rare use of xanax 0.25mg . I have refilled today.   Orders: -     ALPRAZolam; Take 1 tablet (0.25 mg total) by mouth daily as needed for anxiety or sleep.  Dispense: 30 tablet; Refill: 1  Uncomplicated asthma, unspecified asthma severity, unspecified whether persistent -     Budesonide-Formoterol Fumarate; Inhale 2 puffs into the lungs 2 (two) times daily.  Dispense: 1 each; Refill: 2 -     Montelukast Sodium; Take 1 tablet (10 mg total) by mouth daily.  Dispense: 90 tablet; Refill: 3  Allergic rhinitis, unspecified seasonality, unspecified trigger -     Montelukast Sodium; Take 1 tablet (10 mg total) by mouth daily.  Dispense: 90 tablet; Refill: 3  Hyperlipidemia associated with type 2 diabetes mellitus (HCC) -     Empagliflozin; Take 1 tablet (10 mg total) by mouth daily before breakfast.  Dispense: 90 tablet; Refill: 3 -     Lipid panel -     Comprehensive metabolic panel     Return precautions given.   Risks, benefits, and alternatives of the medications and treatment plan prescribed today were discussed, and patient expressed understanding.   Education regarding symptom management and diagnosis given to patient on AVS either electronically or printed.  Return in about 3 months (around 07/10/2023).  Rennie Plowman, FNP  Subjective:    Patient ID: Jaclyn Lin, female    DOB: 09-22-1965, 57 y.o.   MRN: [id number]  CC: Jaclyn Lin is a 57 y.o. female who presents today for follow up.   HPI: Follow  up after starting jardiance and resuming crestor 10mg   Feels well today.   Urinary symptoms have resolved. She had felt suprapubic pressure  Denies hematuria, dysuria, urinary frequency, hematuria.   She meets with nutritionist next week     She requests refills today  Allergies: Codeine Current Outpatient Medications on File Prior to Visit  Medication Sig Dispense Refill   albuterol (VENTOLIN HFA) 108 (90 Base) MCG/ACT inhaler INHALE 2 PUFFS INTO THE LUNGS EVERY 6 HOURS AS NEEDED FOR WHEEZING OR SHORTNESS OF BREATH 8.5 g 3   FLOWFLEX COVID-19 AG HOME TEST KIT      rosuvastatin (CRESTOR) 10 MG tablet Take one tablet by mouth every evening. 90 tablet 3   triamterene-hydrochlorothiazide (DYAZIDE) 37.5-25 MG capsule TAKE 1 CAPSULE BY MOUTH EVERY DAY 90 capsule 1   No current facility-administered medications on file prior to visit.    Review of Systems  Constitutional:  Negative for chills and fever.  Respiratory:  Negative for cough.   Cardiovascular:  Negative for chest pain and palpitations.  Gastrointestinal:  Negative for nausea and vomiting.      Objective:    BP 130/70   Pulse 78   Temp 98.4 F (36.9 C) (Oral)   Ht 5\' 6"  (1.676 m)   Wt 189 lb 3.2 oz (85.8 kg)   SpO2 96%   BMI 30.54 kg/m  BP Readings from Last 3 Encounters:  04/09/23 130/70  03/13/23 125/80  02/07/23 128/82   Wt Readings from Last  3 Encounters:  04/09/23 189 lb 3.2 oz (85.8 kg)  03/13/23 190 lb 6.4 oz (86.4 kg)  02/07/23 187 lb 6.4 oz (85 kg)    Physical Exam Vitals reviewed.  Constitutional:      Appearance: She is well-developed.  Eyes:     Conjunctiva/sclera: Conjunctivae normal.  Cardiovascular:     Rate and Rhythm: Normal rate and regular rhythm.     Pulses: Normal pulses.     Heart sounds: Normal heart sounds.  Pulmonary:     Effort: Pulmonary effort is normal.     Breath sounds: Normal breath sounds. No wheezing, rhonchi or rales.  Skin:    General: Skin is warm and dry.   Neurological:     Mental Status: She is alert.  Psychiatric:        Speech: Speech normal.        Behavior: Behavior normal.        Thought Content: Thought content normal.

## 2023-04-14 ENCOUNTER — Ambulatory Visit (INDEPENDENT_AMBULATORY_CARE_PROVIDER_SITE_OTHER): Payer: Managed Care, Other (non HMO) | Admitting: Primary Care

## 2023-04-14 VITALS — BP 136/80 | HR 79

## 2023-04-14 DIAGNOSIS — R0683 Snoring: Secondary | ICD-10-CM | POA: Diagnosis not present

## 2023-04-14 NOTE — Assessment & Plan Note (Signed)
-   Patient has symptoms of loud snoring and witnessed apnea. No significant daytime sleepiness.  Epworth score is 0.  BMI 30. No previous sleep studies.  Needs home sleep study evaluate for OSA.  Reviewed risks of untreated sleep apnea including cardiac arrhythmias, pulmonary hypertension, diabetes and stroke.  We also discussed treatment options including weight loss, oral appliance, CPAP therapy referral to ENT for possible surgical options.  Encourage weight loss and side sleeping position or elevate head of bed 30 degrees.  Follow-up 1 to 2 weeks after sleep study review results and treatment options if needed.

## 2023-04-14 NOTE — Assessment & Plan Note (Addendum)
-   Asthma symptoms are well controlled on Singulair at bedtime and prn Symbicort 

## 2023-04-14 NOTE — Patient Instructions (Addendum)
Sleep apnea is defined as period of 10 seconds or longer when you stop breathing at night. This can happen multiple times a night. Dx sleep apnea is when this occurs more than 5 times an hour.    Mild OSA 5-15 apneic events an hour Moderate OSA 15-30 apneic events an hour Severe OSA > 30 apneic events an hour   Untreated sleep apnea puts you at higher risk for cardiac arrhythmias, pulmonary HTN, stroke and diabetes  Treatment options include weight loss, side sleeping position, oral appliance, CPAP therapy or referral to ENT for possible surgical options    Recommendations: Focus on side sleeping position or elevate head with wedge pillow 30 degrees Work on weight loss efforts if able  Do not drive if experiencing excessive daytime sleepiness of fatigue    Orders: Home sleep study re: loud snoring    Follow-up: Please call to schedule follow-up 1-2 weeks after completing home sleep study to review results and treatment if needed (can be virtual)  Sleep Apnea Sleep apnea affects breathing during sleep. It causes breathing to stop for 10 seconds or more, or to become shallow. People with sleep apnea usually snore loudly. It can also increase the risk of: Heart attack. Stroke. Being very overweight (obese). Diabetes. Heart failure. Irregular heartbeat. High blood pressure. The goal of treatment is to help you breathe normally again. What are the causes?  The most common cause of this condition is a collapsed or blocked airway. There are three kinds of sleep apnea: Obstructive sleep apnea. This is caused by a blocked or collapsed airway. Central sleep apnea. This happens when the brain does not send the right signals to the muscles that control breathing. Mixed sleep apnea. This is a combination of obstructive and central sleep apnea. What increases the risk? Being overweight. Smoking. Having a small airway. Being older. Being female. Drinking alcohol. Taking medicines to  calm yourself (sedatives or tranquilizers). Having family members with the condition. Having a tongue or tonsils that are larger than normal. What are the signs or symptoms? Trouble staying asleep. Loud snoring. Headaches in the morning. Waking up gasping. Dry mouth or sore throat in the morning. Being sleepy or tired during the day. If you are sleepy or tired during the day, you may also: Not be able to focus your mind (concentrate). Forget things. Get angry a lot and have mood swings. Feel sad (depressed). Have changes in your personality. Have less interest in sex, if you are female. Be unable to have an erection, if you are female. How is this treated?  Sleeping on your side. Using a medicine to get rid of mucus in your nose (decongestant). Avoiding the use of alcohol, medicines to help you relax, or certain pain medicines (narcotics). Losing weight, if needed. Changing your diet. Quitting smoking. Using a machine to open your airway while you sleep, such as: An oral appliance. This is a mouthpiece that shifts your lower jaw forward. A CPAP device. This device blows air through a mask when you breathe out (exhale). An EPAP device. This has valves that you put in each nostril. A BIPAP device. This device blows air through a mask when you breathe in (inhale) and breathe out. Having surgery if other treatments do not work. Follow these instructions at home: Lifestyle Make changes that your doctor recommends. Eat a healthy diet. Lose weight if needed. Avoid alcohol, medicines to help you relax, and some pain medicines. Do not smoke or use any products that contain   nicotine or tobacco. If you need help quitting, ask your doctor. General instructions Take over-the-counter and prescription medicines only as told by your doctor. If you were given a machine to use while you sleep, use it only as told by your doctor. If you are having surgery, make sure to tell your doctor you have  sleep apnea. You may need to bring your device with you. Keep all follow-up visits. Contact a doctor if: The machine that you were given to use during sleep bothers you or does not seem to be working. You do not get better. You get worse. Get help right away if: Your chest hurts. You have trouble breathing in enough air. You have an uncomfortable feeling in your back, arms, or stomach. You have trouble talking. One side of your body feels weak. A part of your face is hanging down. These symptoms may be an emergency. Get help right away. Call your local emergency services (911 in the [place] Do not wait to see if the symptoms will go away. Do not drive yourself to the hospital. Summary This condition affects breathing during sleep. The most common cause is a collapsed or blocked airway. The goal of treatment is to help you breathe normally while you sleep. This information is not intended to replace advice given to you by your health care provider. Make sure you discuss any questions you have with your health care provider. Document Revised: 01/17/2021 Document Reviewed: 05/19/2020 Elsevier Patient Education  2024 [employer]  

## 2023-04-14 NOTE — Progress Notes (Signed)
@Patient  ID: Jaclyn Lin, female    DOB: [birth year], 57 y.o.   MRN: [id number]  Chief Complaint  Patient presents with   Consult    Referring provider: Allegra Grana, FNP  HPI: 57 year old female, current everyday smoker. Past medical history significant for hypertension, aortic arthrosclerosis, asthma, lung nodule, type 2 diabetes, hyperlipidemia.  04/14/2023 Patient presents today for sleep consult due to snoring symptoms. She went on an overnight trip with her mother and sister to [place], they told her she snored loudly and noticed disrupted breathing pattern. She has never woken herself up gasping/choking. She takes tylenol PM at night which helps her fall asleep. She wakes up on average three times a night. She feels tired during the day but does not fall asleep when in active. She works from home.  No concern for narcolepsy, cataplexy or sleepwalking.   Sleep questionnaire Symptoms-  loud snoring, witnessed apnea  Prior sleep study-  none Bedtime- 9pm-11pm Time to fall asleep- awhile  Nocturnal awakenings- 3 times  Out of bed/start of day- 7:30am Weight changes- 10-20lbs  Do you operate heavy machinery- no  Do you currently wear CPAP- no Do you current wear oxygen- no Epworth- 0   Allergies  Allergen Reactions   Codeine Itching    Immunization History  Administered Date(s) Administered   PFIZER(Purple Top)SARS-COV-2 Vaccination 09/20/2019, 10/13/2019   Pneumococcal Polysaccharide-23 02/11/2020   Tdap 09/04/2018    Past Medical History:  Diagnosis Date   Asthma    childhood   Hypertension    UTI (lower urinary tract infection)     Tobacco History: Social History   Tobacco Use  Smoking Status Every Day   Current packs/day: 0.75   Average packs/day: 0.7 packs/day for 39.0 years (29.2 ttl pk-yrs)   Types: Cigarettes   Start date: 02/11/2016  Smokeless Tobacco Never  Tobacco Comments   smoking every couple of days   Ready to quit: Not  Answered Counseling given: Not Answered Tobacco comments: smoking every couple of days   Outpatient Medications Prior to Visit  Medication Sig Dispense Refill   albuterol (VENTOLIN HFA) 108 (90 Base) MCG/ACT inhaler INHALE 2 PUFFS INTO THE LUNGS EVERY 6 HOURS AS NEEDED FOR WHEEZING OR SHORTNESS OF BREATH 8.5 g 3   ALPRAZolam (XANAX) 0.25 MG tablet Take 1 tablet (0.25 mg total) by mouth daily as needed for anxiety or sleep. 30 tablet 1   budesonide-formoterol (SYMBICORT) 80-4.5 MCG/ACT inhaler Inhale 2 puffs into the lungs 2 (two) times daily. 1 each 2   empagliflozin (JARDIANCE) 10 MG TABS tablet Take 1 tablet (10 mg total) by mouth daily before breakfast. 90 tablet 3   montelukast (SINGULAIR) 10 MG tablet Take 1 tablet (10 mg total) by mouth daily. 90 tablet 3   rosuvastatin (CRESTOR) 10 MG tablet Take one tablet by mouth every evening. 90 tablet 3   triamterene-hydrochlorothiazide (DYAZIDE) 37.5-25 MG capsule TAKE 1 CAPSULE BY MOUTH EVERY DAY 90 capsule 1   FLOWFLEX COVID-19 AG HOME TEST KIT      No facility-administered medications prior to visit.   Review of Systems  Review of Systems  Constitutional: Negative.  Negative for fatigue.  HENT: Negative.    Respiratory: Negative.  Negative for cough, shortness of breath and wheezing.   Psychiatric/Behavioral:  Positive for sleep disturbance.     Physical Exam  BP 136/80 (BP Location: Right Arm, Cuff Size: Normal)   Pulse 79  Physical Exam Constitutional:      Appearance: Normal appearance.  HENT:     Head: Normocephalic and atraumatic.  Cardiovascular:     Rate and Rhythm: Normal rate and regular rhythm.  Pulmonary:     Effort: Pulmonary effort is normal.     Breath sounds: Normal breath sounds. No wheezing, rhonchi or rales.  Neurological:     General: No focal deficit present.     Mental Status: She is alert and oriented to person, place, and time. Mental status is at baseline.  Psychiatric:        Mood and Affect: Mood  normal.        Behavior: Behavior normal.        Thought Content: Thought content normal.        Judgment: Judgment normal.      Lab Results:  CBC    Component Value Date/Time   WBC 7.1 02/28/2023 0934   WBC 7.2 02/01/2022 1049   RBC 4.78 02/28/2023 0934   RBC 4.80 02/01/2022 1049   HGB 12.8 02/28/2023 0934   HCT 41.0 02/28/2023 0934   PLT 264 02/28/2023 0934   MCV 86 02/28/2023 0934   MCH 26.8 02/28/2023 0934   MCHC 31.2 (L) 02/28/2023 0934   MCHC 33.0 02/01/2022 1049   RDW 13.7 02/28/2023 0934   LYMPHSABS 2.7 02/28/2023 0934   MONOABS 0.4 02/01/2022 1049   EOSABS 0.1 02/28/2023 0934   BASOSABS 0.1 02/28/2023 0934    BMET    Component Value Date/Time   NA 140 04/09/2023 1054   NA 143 02/28/2023 0934   K 3.5 04/09/2023 1054   CL 103 04/09/2023 1054   CO2 29 04/09/2023 1054   GLUCOSE 100 (H) 04/09/2023 1054   BUN 14 04/09/2023 1054   BUN 18 02/28/2023 0934   CREATININE 0.74 04/09/2023 1054   CREATININE 0.72 02/17/2019 1532   CALCIUM 10.1 04/09/2023 1054   GFRNONAA 89 02/11/2020 0946   GFRAA 103 02/11/2020 0946    BNP No results found for: "BNP"  ProBNP No results found for: "PROBNP"  Imaging: No results found.   Assessment & Plan:   Loud snoring - Patient has symptoms of loud snoring and witnessed apnea. No significant daytime sleepiness.  Epworth score is 0.  BMI 30. No previous sleep studies.  Needs home sleep study evaluate for OSA.  Reviewed risks of untreated sleep apnea including cardiac arrhythmias, pulmonary hypertension, diabetes and stroke.  We also discussed treatment options including weight loss, oral appliance, CPAP therapy referral to ENT for possible surgical options.  Encourage weight loss and side sleeping position or elevate head of bed 30 degrees.  Follow-up 1 to 2 weeks after sleep study review results and treatment options if needed.  Uncomplicated asthma - Asthma symptoms are well controlled on Singulair at bedtime and prn  Symbicort   Glenford Bayley, NP 04/14/2023

## 2023-04-29 ENCOUNTER — Telehealth: Payer: Self-pay

## 2023-04-29 NOTE — Telephone Encounter (Signed)
Spoke to pt and she is scheduled for an appt in office on 05/01/23

## 2023-04-29 NOTE — Telephone Encounter (Signed)
Patient states she is returning a call from Thurmond Butts, CMA.  I was unable to reach Latoya at the time of the call.  I let patient know that I will send her a message so she can call her back.

## 2023-04-30 ENCOUNTER — Encounter: Payer: Self-pay | Admitting: *Deleted

## 2023-04-30 NOTE — Telephone Encounter (Signed)
Not sure what the call was regarding but I left another voicemail with patient

## 2023-05-01 ENCOUNTER — Encounter: Payer: Self-pay | Admitting: Family

## 2023-05-01 ENCOUNTER — Ambulatory Visit (INDEPENDENT_AMBULATORY_CARE_PROVIDER_SITE_OTHER): Payer: Managed Care, Other (non HMO) | Admitting: Family

## 2023-05-01 VITALS — BP 128/78 | HR 85 | Temp 98.3°F | Ht 66.0 in | Wt 185.6 lb

## 2023-05-01 DIAGNOSIS — R102 Pelvic and perineal pain: Secondary | ICD-10-CM | POA: Diagnosis not present

## 2023-05-01 DIAGNOSIS — N898 Other specified noninflammatory disorders of vagina: Secondary | ICD-10-CM | POA: Diagnosis not present

## 2023-05-01 DIAGNOSIS — R319 Hematuria, unspecified: Secondary | ICD-10-CM | POA: Insufficient documentation

## 2023-05-01 NOTE — Assessment & Plan Note (Addendum)
Discussed extensive evaluation for low back pain, suprapubic pressure.  We reviewed transvaginal ultrasound, lumbar x-ray, bilateral hip x-ray, CT abdomen/pelvis, MRI abdomen, CT chest.    Discussed more likely to be musculoskeletal in nature.  Advised patient to schedule Tylenol arthritis 650 mg twice daily.  Provided home exercises for low back.  Patient politely declines physical therapy at this time.  Pending repeat urine studies, wet prep. Discussed trial of Cymbalta.  She politely declines at this time.

## 2023-05-01 NOTE — Progress Notes (Signed)
Assessment & Plan:  Hematuria, unspecified type -     Urinalysis, Routine w reflex microscopic -     Urine Culture -     WET PREP FOR TRICH, YEAST, CLUE  Suprapubic pressure Assessment & Plan: Discussed extensive evaluation for low back pain, suprapubic pressure.  We reviewed transvaginal ultrasound, lumbar x-ray, bilateral hip x-ray, CT abdomen/pelvis, MRI abdomen, CT chest.    Discussed more likely to be musculoskeletal in nature.  Advised patient to schedule Tylenol arthritis 650 mg twice daily.  Provided home exercises for low back.  Patient politely declines physical therapy at this time.  Pending repeat urine studies, wet prep. Discussed trial of Cymbalta.  She politely declines at this time.       Return precautions given.   Risks, benefits, and alternatives of the medications and treatment plan prescribed today were discussed, and patient expressed understanding.   Education regarding symptom management and diagnosis given to patient on AVS either electronically or printed.  No follow-ups on file.  Rennie Plowman, FNP I have spent 25 minutes with a patient including precharting, exam, reviewing medical records, and discussion plan of care.     Subjective:    Patient ID: Jaclyn Lin, female    DOB: 1966/04/07, 57 y.o.   MRN: [id number]  CC: Jaclyn Lin is a 57 y.o. female who presents today for an acute visit.    HPI: She complains of low back pain, bilateral groin pain.   She is taking tylenol 500mg  as needed. She has been flexeri 5mg  at bedtime.   Denies dysuria, flank pain, numbness      Seen UC 04/27/23 for back pain and urinary frequency Urinalysis with trace protein, glucose, small blood.  No bacteria Lumbar x-ray without acute bony injury or acute ureterolithiasis  urine culture without growth  Prescribed cyclobenzaprine, cefdinir for UTI  Compliant with jardiance  Ct a/p 04/03/22 without acute abnormality in abdomen or  pelvis. 12 mm liver lesion, 3 mm pulmonary nodule MRI abdomen 02/17/2023 1.3 cm lesion liver consistent with benign hemangioma or small focal nodular hyperplasia.  No further follow-up or characterization is required  CT chest 02/17/2023 multiple scattered tiny pulmonary nodules. Cologuard negative, 02/2023  Patient due for CT lung cancer screening program 02/2024 Allergies: Codeine Current Outpatient Medications on File Prior to Visit  Medication Sig Dispense Refill   albuterol (VENTOLIN HFA) 108 (90 Base) MCG/ACT inhaler INHALE 2 PUFFS INTO THE LUNGS EVERY 6 HOURS AS NEEDED FOR WHEEZING OR SHORTNESS OF BREATH 8.5 g 3   ALPRAZolam (XANAX) 0.25 MG tablet Take 1 tablet (0.25 mg total) by mouth daily as needed for anxiety or sleep. 30 tablet 1   budesonide-formoterol (SYMBICORT) 80-4.5 MCG/ACT inhaler Inhale 2 puffs into the lungs 2 (two) times daily. 1 each 2   empagliflozin (JARDIANCE) 10 MG TABS tablet Take 1 tablet (10 mg total) by mouth daily before breakfast. 90 tablet 3   montelukast (SINGULAIR) 10 MG tablet Take 1 tablet (10 mg total) by mouth daily. 90 tablet 3   rosuvastatin (CRESTOR) 10 MG tablet Take one tablet by mouth every evening. 90 tablet 3   triamterene-hydrochlorothiazide (DYAZIDE) 37.5-25 MG capsule TAKE 1 CAPSULE BY MOUTH EVERY DAY 90 capsule 1   No current facility-administered medications on file prior to visit.    Review of Systems  Constitutional:  Negative for chills and fever.  Respiratory:  Negative for cough.   Cardiovascular:  Negative for chest pain and palpitations.  Gastrointestinal:  Negative for  nausea and vomiting.  Genitourinary:  Negative for difficulty urinating, dysuria and hematuria.  Musculoskeletal:  Positive for back pain.  Neurological:  Negative for numbness.      Objective:    BP 128/78   Pulse 85   Temp 98.3 F (36.8 C) (Oral)   Ht 5\' 6"  (1.676 m)   Wt 185 lb 9.6 oz (84.2 kg)   SpO2 97%   BMI 29.96 kg/m   BP Readings from Last 3  Encounters:  05/01/23 128/78  04/14/23 136/80  04/09/23 130/70   Wt Readings from Last 3 Encounters:  05/01/23 185 lb 9.6 oz (84.2 kg)  04/09/23 189 lb 3.2 oz (85.8 kg)  03/13/23 190 lb 6.4 oz (86.4 kg)    Physical Exam Vitals reviewed.  Constitutional:      Appearance: Normal appearance. She is well-developed.  Eyes:     Conjunctiva/sclera: Conjunctivae normal.  Cardiovascular:     Rate and Rhythm: Normal rate and regular rhythm.     Pulses: Normal pulses.     Heart sounds: Normal heart sounds.  Pulmonary:     Effort: Pulmonary effort is normal.     Breath sounds: Normal breath sounds. No wheezing, rhonchi or rales.  Abdominal:     General: Bowel sounds are normal. There is no distension.     Palpations: Abdomen is soft. Abdomen is not rigid. There is no fluid wave or mass.     Tenderness: There is no abdominal tenderness. There is no guarding or rebound.  Musculoskeletal:       Arms:     Lumbar back: No swelling, edema, spasms, tenderness or bony tenderness. Normal range of motion.     Comments: Full range of motion with flexion, tension, lateral side bends.  Pain in low back with flexion. no bony tenderness. No pain, numbness, tingling elicited with single leg raise bilaterally.  Bilateral Hip: No limp or waddling gait. Full ROM with flexion and hip rotation in flexion.    No pain of lateral hip with  (flexion-abduction-external rotation) test.   No pain with deep palpation of greater trochanter.     Skin:    General: Skin is warm and dry.  Neurological:     Mental Status: She is alert.     Sensory: No sensory deficit.     Deep Tendon Reflexes:     Reflex Scores:      Patellar reflexes are 2+ on the right side and 2+ on the left side.    Comments: Sensation and strength intact bilateral lower extremities.  Psychiatric:        Speech: Speech normal.        Behavior: Behavior normal.        Thought Content: Thought content normal.

## 2023-05-01 NOTE — Patient Instructions (Addendum)
Call to make an appointment for Annual Lung cancer screen  With CT Chest:  [phone]. Let me know if any issues in doing so. You are 02/2024. I would call in the summer to schedule.   As discussed, let's start by scheduling Tylenol Arthritis which is a 650mg  tablet .   You may take 1 tablets  in the morning and one at night.    Do not exceed 6 tablets a day of Tylenol Arthritis 650mg  tablet    Maximum daily dose of acetaminophen 4 g per day from all sources.  If you are taking another medication which includes acetaminophen (Tylenol) which may be in cough and cold preparations or pain medication such as Percocet, you will need to factor that into your total daily dose to be safe.  Please let me know if any questions  A great article regarding how to safely take and dose tylenol found below.  Title : 'Acetaminophen safety: Be cautious but not afraid'  https://www.health.https://gentry.org/   Low Back Sprain or Strain Rehab Ask your health care provider which exercises are safe for you. Do exercises exactly as told by your health care provider and adjust them as directed. It is normal to feel mild stretching, pulling, tightness, or discomfort as you do these exercises. Stop right away if you feel sudden pain or your pain gets worse. Do not begin these exercises until told by your health care provider. Stretching and range-of-motion exercises These exercises warm up your muscles and joints and improve the movement and flexibility of your back. These exercises also help to relieve pain, numbness, and tingling. Lumbar rotation  Lie on your back on a firm bed or the floor with your knees bent. Straighten your arms out to your sides so each arm forms a 90-degree angle (right angle) with a side of your body. Slowly move (rotate) both of your knees to one side of your body until you feel a stretch in your lower back (lumbar). Try not to let your  shoulders lift off the floor. Hold this position for __________ seconds. Tense your abdominal muscles and slowly move your knees back to the starting position. Repeat this exercise on the other side of your body. Repeat __________ times. Complete this exercise __________ times a day. Single knee to chest  Lie on your back on a firm bed or the floor with both legs straight. Bend one of your knees. Use your hands to move your knee up toward your chest until you feel a gentle stretch in your lower back and buttock. Hold your leg in this position by holding on to the front of your knee. Keep your other leg as straight as possible. Hold this position for __________ seconds. Slowly return to the starting position. Repeat with your other leg. Repeat __________ times. Complete this exercise __________ times a day. Prone extension on elbows  Lie on your abdomen on a firm bed or the floor (prone position). Prop yourself up on your elbows. Use your arms to help lift your chest up until you feel a gentle stretch in your abdomen and your lower back. This will place some of your body weight on your elbows. If this is uncomfortable, try stacking pillows under your chest. Your hips should stay down, against the surface that you are lying on. Keep your hip and back muscles relaxed. Hold this position for __________ seconds. Slowly relax your upper body and return to the starting position. Repeat __________ times. Complete this exercise __________ times  a day. Strengthening exercises These exercises build strength and endurance in your back. Endurance is the ability to use your muscles for a long time, even after they get tired. Pelvic tilt This exercise strengthens the muscles that lie deep in the abdomen. Lie on your back on a firm bed or the floor with your legs extended. Bend your knees so they are pointing toward the ceiling and your feet are flat on the floor. Tighten your lower abdominal muscles  to press your lower back against the floor. This motion will tilt your pelvis so your tailbone points up toward the ceiling instead of pointing to your feet or the floor. To help with this exercise, you may place a small towel under your lower back and try to push your back into the towel. Hold this position for __________ seconds. Let your muscles relax completely before you repeat this exercise. Repeat __________ times. Complete this exercise __________ times a day. Alternating arm and leg raises  Get on your hands and knees on a firm surface. If you are on a hard floor, you may want to use padding, such as an exercise mat, to cushion your knees. Line up your arms and legs. Your hands should be directly below your shoulders, and your knees should be directly below your hips. Lift your left leg behind you. At the same time, raise your right arm and straighten it in front of you. Do not lift your leg higher than your hip. Do not lift your arm higher than your shoulder. Keep your abdominal and back muscles tight. Keep your hips facing the ground. Do not arch your back. Keep your balance carefully, and do not hold your breath. Hold this position for __________ seconds. Slowly return to the starting position. Repeat with your right leg and your left arm. Repeat __________ times. Complete this exercise __________ times a day. Abdominal set with straight leg raise  Lie on your back on a firm bed or the floor. Bend one of your knees and keep your other leg straight. Tense your abdominal muscles and lift your straight leg up, 4-6 inches (10-15 cm) off the ground. Keep your abdominal muscles tight and hold this position for __________ seconds. Do not hold your breath. Do not arch your back. Keep it flat against the ground. Keep your abdominal muscles tense as you slowly lower your leg back to the starting position. Repeat with your other leg. Repeat __________ times. Complete this exercise  __________ times a day. Single leg lower with bent knees Lie on your back on a firm bed or the floor. Tense your abdominal muscles and lift your feet off the floor, one foot at a time, so your knees and hips are bent in 90-degree angles (right angles). Your knees should be over your hips and your lower legs should be parallel to the floor. Keeping your abdominal muscles tense and your knee bent, slowly lower one of your legs so your toe touches the ground. Lift your leg back up to return to the starting position. Do not hold your breath. Do not let your back arch. Keep your back flat against the ground. Repeat with your other leg. Repeat __________ times. Complete this exercise __________ times a day. Posture and body mechanics Good posture and healthy body mechanics can help to relieve stress in your body's tissues and joints. Body mechanics refers to the movements and positions of your body while you do your daily activities. Posture is part of body mechanics. Good  posture means: Your spine is in its natural S-curve position (neutral). Your shoulders are pulled back slightly. Your head is not tipped forward (neutral). Follow these guidelines to improve your posture and body mechanics in your everyday activities. Standing  When standing, keep your spine neutral and your feet about hip-width apart. Keep a slight bend in your knees. Your ears, shoulders, and hips should line up. When you do a task in which you stand in one place for a long time, place one foot up on a stable object that is 2-4 inches (5-10 cm) high, such as a footstool. This helps keep your spine neutral. Sitting  When sitting, keep your spine neutral and keep your feet flat on the floor. Use a footrest, if necessary, and keep your thighs parallel to the floor. Avoid rounding your shoulders, and avoid tilting your head forward. When working at a desk or a computer, keep your desk at a height where your hands are slightly lower  than your elbows. Slide your chair under your desk so you are close enough to maintain good posture. When working at a computer, place your monitor at a height where you are looking straight ahead and you do not have to tilt your head forward or downward to look at the screen. Resting When lying down and resting, avoid positions that are most painful for you. If you have pain with activities such as sitting, bending, stooping, or squatting, lie in a position in which your body does not bend very much. For example, avoid curling up on your side with your arms and knees near your chest (fetal position). If you have pain with activities such as standing for a long time or reaching with your arms, lie with your spine in a neutral position and bend your knees slightly. Try the following positions: Lying on your side with a pillow between your knees. Lying on your back with a pillow under your knees. Lifting  When lifting objects, keep your feet at least shoulder-width apart and tighten your abdominal muscles. Bend your knees and hips and keep your spine neutral. It is important to lift using the strength of your legs, not your back. Do not lock your knees straight out. Always ask for help to lift heavy or awkward objects. This information is not intended to replace advice given to you by your health care provider. Make sure you discuss any questions you have with your health care provider. Document Revised: 10/14/2022 Document Reviewed: 08/28/2020 Elsevier Patient Education  2024 [employer].

## 2023-05-02 LAB — URINALYSIS, ROUTINE W REFLEX MICROSCOPIC
Bilirubin, UA: NEGATIVE
Ketones, UA: NEGATIVE
Leukocytes,UA: NEGATIVE
Nitrite, UA: NEGATIVE
Protein,UA: NEGATIVE
RBC, UA: NEGATIVE
Specific Gravity, UA: 1.018 (ref 1.005–1.030)
Urobilinogen, Ur: 0.2 mg/dL (ref 0.2–1.0)
pH, UA: 7.5 (ref 5.0–7.5)

## 2023-05-03 LAB — URINE CULTURE: Organism ID, Bacteria: NO GROWTH

## 2023-05-05 LAB — WET PREP FOR TRICH, YEAST, CLUE

## 2023-05-05 NOTE — Addendum Note (Signed)
Addended by: [place], Geraldin Habermehl on: 05/05/2023 09:29 AM   Modules accepted: Orders

## 2023-05-06 ENCOUNTER — Other Ambulatory Visit: Payer: Managed Care, Other (non HMO)

## 2023-05-06 ENCOUNTER — Ambulatory Visit: Payer: Managed Care, Other (non HMO) | Admitting: Dietician

## 2023-05-20 ENCOUNTER — Other Ambulatory Visit: Payer: Self-pay

## 2023-05-20 DIAGNOSIS — Z0000 Encounter for general adult medical examination without abnormal findings: Secondary | ICD-10-CM

## 2023-05-20 DIAGNOSIS — J45909 Unspecified asthma, uncomplicated: Secondary | ICD-10-CM

## 2023-05-20 MED ORDER — BUDESONIDE-FORMOTEROL FUMARATE 80-4.5 MCG/ACT IN AERO: 2.0000 | INHALATION_SPRAY | Freq: Two times a day (BID) | RESPIRATORY_TRACT | 2 refills | Status: AC

## 2023-05-20 MED ORDER — ROSUVASTATIN CALCIUM 10 MG PO TABS: ORAL_TABLET | ORAL | 3 refills | Status: DC

## 2023-06-08 ENCOUNTER — Encounter: Payer: Self-pay | Admitting: Emergency Medicine

## 2023-06-08 ENCOUNTER — Ambulatory Visit: Payer: Managed Care, Other (non HMO)

## 2023-06-08 ENCOUNTER — Ambulatory Visit
Admission: EM | Admit: 2023-06-08 | Discharge: 2023-06-08 | Disposition: A | Discharge status: Home / Self Care | Payer: Managed Care, Other (non HMO) | Attending: Emergency Medicine | Admitting: Emergency Medicine

## 2023-06-08 DIAGNOSIS — J22 Unspecified acute lower respiratory infection: Secondary | ICD-10-CM | POA: Diagnosis not present

## 2023-06-08 DIAGNOSIS — B9789 Other viral agents as the cause of diseases classified elsewhere: Secondary | ICD-10-CM | POA: Insufficient documentation

## 2023-06-08 LAB — RESP PANEL BY RT-PCR (FLU A&B, COVID) ARPGX2
Influenza A by PCR: NEGATIVE
Influenza B by PCR: NEGATIVE
SARS Coronavirus 2 by RT PCR: NEGATIVE

## 2023-06-08 MED ORDER — PROMETHAZINE-DM 6.25-15 MG/5ML PO SYRP: 5.0000 mL | ORAL_SOLUTION | Freq: Four times a day (QID) | ORAL | 0 refills | Status: DC | PRN

## 2023-06-08 MED ORDER — BENZONATATE 100 MG PO CAPS: 200.0000 mg | ORAL_CAPSULE | Freq: Three times a day (TID) | ORAL | 0 refills | Status: DC

## 2023-06-08 NOTE — Discharge Instructions (Addendum)
Your chest x-ray did not demonstrate any evidence of pneumonia.  I do believe you have a viral respiratory infection causing your symptoms.  Please use over-the-counter Tylenol and/or ibuprofen according the package instructions as needed for any fever or pain.  Use your albuterol inhaler as needed for any shortness breath or wheezing.  Use the Tessalon Perles every 8 hours during the day.  Take them with a small sip of water.  They may give you some numbness to the base of your tongue or a metallic taste in your mouth, this is normal.  Use the Promethazine DM cough syrup at bedtime for cough and congestion.  It will make you drowsy so do not take it during the day.  Return for reevaluation or see your primary care provider for any new or worsening symptoms.

## 2023-06-08 NOTE — ED Triage Notes (Signed)
Patient c/o cough and chest congestion for 2 days.  Patient denies fevers.   

## 2023-06-08 NOTE — ED Provider Notes (Signed)
[facility]    CSN: [id number] Arrival date & time: 06/08/23  1235      History   Chief Complaint Chief Complaint  Patient presents with   Cough    HPI Jaclyn Lin is a 57 y.o. female.   HPI  57 year old female with a past medical history significant for hypertension and childhood asthma presents for evaluation of cough and chest congestion that started 2 days ago.  She denies any runny nose, nasal congestion, fever, or shortness of breath.  She states her cough is productive and that she has experienced some intermittent wheezing.  She has been using her albuterol inhaler which has helped the wheezing.  She is unaware of any sick contacts as she states she works from home and has not been in contact with other people.  Past Medical History:  Diagnosis Date   Asthma    childhood   Hypertension    UTI (lower urinary tract infection)     Patient Active Problem List   Diagnosis Date Noted   Hematuria 05/01/2023   Loud snoring 04/14/2023   Headache 03/13/2023   COVID-19 05/15/2022   Lung nodule 04/30/2022   Liver lesion 04/30/2022   Aortic atherosclerosis (HCC) 04/30/2022   Groin pain 12/11/2020   History of hysterectomy 03/29/2020   Hyperlipidemia associated with type 2 diabetes mellitus (HCC) 03/29/2020   Arthralgia 02/11/2020   Breast mass, right 05/17/2019   Suprapubic pressure 02/18/2019   Left knee pain 02/17/2019   Right arm pain 09/07/2018   Screening for breast cancer 09/07/2018   Shortness of breath 03/09/2018   Uncomplicated asthma 01/23/2018   Soft tissue mass 01/23/2018   Gastroesophageal reflux disease 02/10/2017   BPPV (benign paroxysmal positional vertigo) 09/11/2015   Chronic fatigue 01/27/2015   Routine general medical examination at a health care facility 07/21/2014   Controlled diabetes mellitus type 2 with complications (HCC) 12/17/2013   Anxiety 04/29/2012   Bronchitis 03/12/2012   Hypertension 05/24/2011    Past  Surgical History:  Procedure Laterality Date   ABDOMINAL HYSTERECTOMY     partial, Dr. 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     VAGINAL DELIVERY     2    OB History   No obstetric history on file.      Home Medications    Prior to Admission medications   Medication Sig Start Date End Date Taking? Authorizing Provider  albuterol (VENTOLIN HFA) 108 (90 Base) MCG/ACT inhaler INHALE 2 PUFFS INTO THE LUNGS EVERY 6 HOURS AS NEEDED FOR WHEEZING OR SHORTNESS OF BREATH 04/03/23  Yes Arnett, Lyn Records, FNP  benzonatate (TESSALON) 100 MG capsule Take 2 capsules (200 mg total) by mouth every 8 (eight) hours. 06/08/23  Yes Becky Augusta, NP  budesonide-formoterol (SYMBICORT) 80-4.5 MCG/ACT inhaler Inhale 2 puffs into the lungs 2 (two) times daily. 05/20/23  Yes Allegra Grana, FNP  empagliflozin (JARDIANCE) 10 MG TABS tablet Take 1 tablet (10 mg total) by mouth daily before breakfast. 04/09/23  Yes Arnett, Lyn Records, FNP  montelukast (SINGULAIR) 10 MG tablet Take 1 tablet (10 mg total) by mouth daily. 04/09/23  Yes Allegra Grana, FNP  promethazine-dextromethorphan (PROMETHAZINE-DM) 6.25-15 MG/5ML syrup Take 5 mLs by mouth 4 (four) times daily as needed. 06/08/23  Yes Becky Augusta, NP  triamterene-hydrochlorothiazide (DYAZIDE) 37.5-25 MG capsule TAKE 1 CAPSULE BY MOUTH EVERY DAY 03/31/23  Yes Arnett, Lyn Records, FNP  ALPRAZolam Prudy Feeler) 0.25 MG tablet Take 1 tablet (0.25  mg total) by mouth daily as needed for anxiety or sleep. 04/09/23   Allegra Grana, FNP  rosuvastatin (CRESTOR) 10 MG tablet Take one tablet by mouth every evening. 05/20/23   Allegra Grana, FNP    Family History Family History  Problem Relation Age of Onset   Hypertension Mother    Asthma Maternal Grandmother    Brain cancer Maternal Grandmother    Cancer Maternal Grandfather        bone   Asthma Son    Colon cancer Neg Hx     Social History Social History    Tobacco Use   Smoking status: Every Day    Current packs/day: 0.75    Average packs/day: 0.8 packs/day for 39.1 years (29.3 ttl pk-yrs)    Types: Cigarettes    Start date: 02/11/2016   Smokeless tobacco: Never   Tobacco comments:    smoking every couple of days  Vaping Use   Vaping status: Never Used  Substance Use Topics   Alcohol use: Yes    Comment: Beer on weekends; 6 pack   Drug use: No     Allergies   Codeine   Review of Systems Review of Systems  Constitutional:  Negative for fever.  HENT:  Negative for congestion, ear pain, rhinorrhea and sore throat.   Respiratory:  Positive for cough and wheezing. Negative for shortness of breath.      Physical Exam Triage Vital Signs ED Triage Vitals  Encounter Vitals Group     BP 06/08/23 1254 (!) 142/88     Systolic BP Percentile --      Diastolic BP Percentile --      Pulse Rate 06/08/23 1254 81     Resp 06/08/23 1254 14     Temp 06/08/23 1254 98.6 F (37 C)     Temp Source 06/08/23 1254 Oral     SpO2 06/08/23 1254 95 %     Weight 06/08/23 1252 185 lb 10 oz (84.2 kg)     Height 06/08/23 1252 5\' 6"  (1.676 m)     Head Circumference --      Peak Flow --      Pain Score 06/08/23 1252 0     Pain Loc --      Pain Education --      Exclude from Growth Chart --    No data found.  Updated Vital Signs BP (!) 142/88 (BP Location: Right Arm)   Pulse 81   Temp 98.6 F (37 C) (Oral)   Resp 14   Ht 5\' 6"  (1.676 m)   Wt 185 lb 10 oz (84.2 kg)   SpO2 95%   BMI 29.96 kg/m   Visual Acuity Right Eye Distance:   Left Eye Distance:   Bilateral Distance:    Right Eye Near:   Left Eye Near:    Bilateral Near:     Physical Exam Vitals and nursing note reviewed.  Constitutional:      Appearance: Normal appearance. She is not ill-appearing.  HENT:     Head: Normocephalic and atraumatic.  Cardiovascular:     Rate and Rhythm: Normal rate and regular rhythm.     Pulses: Normal pulses.     Heart sounds: Normal  heart sounds. No murmur heard.    No friction rub. No gallop.  Pulmonary:     Effort: Pulmonary effort is normal.     Breath sounds: Normal breath sounds. No wheezing, rhonchi or rales.  Skin:  General: Skin is warm and dry.     Capillary Refill: Capillary refill takes less than 2 seconds.     Findings: No rash.  Neurological:     General: No focal deficit present.     Mental Status: She is alert and oriented to person, place, and time.      UC Treatments / Results  Labs (all labs ordered are listed, but only abnormal results are displayed) Labs Reviewed  RESP PANEL BY RT-PCR (FLU A&B, COVID) [id number]    EKG   Radiology DG Chest 2 View Result Date: 06/08/2023 CLINICAL DATA:  Cough and chest congestion for 2 days. EXAM: CHEST - 2 VIEW COMPARISON:  06/27/2018 FINDINGS: The heart size and mediastinal contours are within normal limits. Both lungs are clear. The visualized skeletal structures are unremarkable. IMPRESSION: No active cardiopulmonary disease. Electronically Signed   By: Danae Orleans M.D.   On: 06/08/2023 13:39    Procedures Procedures (including critical care time)  Medications Ordered in UC Medications - No data to display  Initial Impression / Assessment and Plan / UC Course  I have reviewed the triage vital signs and the nursing notes.  Pertinent labs & imaging results that were available during my care of the patient were reviewed by me and considered in my medical decision making (see chart for details).   Patient is a pleasant, nontoxic-appearing 57 year old female presenting for evaluation of 2 days worth of productive cough and wheezing without associated upper respiratory symptoms.  In the exam room she is able to speak in full sentences without dyspnea or tachypnea.  Room air oxygen saturation is 95% with a respiratory rate of 14.  She is afebrile with an oral temp of 98.6.  Cardiopulmonary exam reveals clear lung sounds in all fields.  Respiratory panel  was collected at triage.  I will order a chest x-ray to evaluate for any acute cardiopulmonary pathology.  Respiratory panel is negative for COVID or influenza.  Chest x-ray independently reviewed and evaluated by me.  Impression: Cardiomediastinal silhouette appears normal.  No definitive infiltrate or effusion.  Radiology overread is pending. Radiology impression states no active cardiopulmonary disease.  Will discharge patient home with a diagnosis of viral URI with a cough with a prescription for Tessalon Perles and Promethazine DM cough syrup to help with cough and congestion.  Tylenol and/or ibuprofen as needed for any fever or pain.  Return precautions reviewed.   Final Clinical Impressions(s) / UC Diagnoses   Final diagnoses:  Viral lower respiratory tract infection     Discharge Instructions      Your chest x-ray did not demonstrate any evidence of pneumonia.  I do believe you have a viral respiratory infection causing your symptoms.  Please use over-the-counter Tylenol and/or ibuprofen according the package instructions as needed for any fever or pain.  Use your albuterol inhaler as needed for any shortness breath or wheezing.  Use the Tessalon Perles every 8 hours during the day.  Take them with a small sip of water.  They may give you some numbness to the base of your tongue or a metallic taste in your mouth, this is normal.  Use the Promethazine DM cough syrup at bedtime for cough and congestion.  It will make you drowsy so do not take it during the day.  Return for reevaluation or see your primary care provider for any new or worsening symptoms.      ED Prescriptions     Medication Sig Dispense Auth.  Provider   benzonatate (TESSALON) 100 MG capsule Take 2 capsules (200 mg total) by mouth every 8 (eight) hours. 21 capsule Becky Augusta, NP   promethazine-dextromethorphan (PROMETHAZINE-DM) 6.25-15 MG/5ML syrup Take 5 mLs by mouth 4 (four) times daily as needed. 118  mL Becky Augusta, NP      PDMP not reviewed this encounter.   Becky Augusta, NP 06/08/23 1352

## 2023-06-09 ENCOUNTER — Encounter: Payer: Self-pay | Admitting: Dietician

## 2023-06-09 ENCOUNTER — Encounter: Payer: Managed Care, Other (non HMO) | Attending: Family | Admitting: Dietician

## 2023-06-09 DIAGNOSIS — E118 Type 2 diabetes mellitus with unspecified complications: Secondary | ICD-10-CM | POA: Diagnosis present

## 2023-06-09 DIAGNOSIS — Z713 Dietary counseling and surveillance: Secondary | ICD-10-CM | POA: Insufficient documentation

## 2023-06-09 NOTE — Progress Notes (Signed)
Diabetes Self-Management Education  Visit Type: First/Initial  Appt. Start Time: 1405 Appt. End Time: 1510  06/09/2023  [name], identified by name and date of birth, is a 57 y.o. female with a diagnosis of Diabetes: Type 2.   ASSESSMENT  Pt reports taking Jardiance for their DM, no side effects. Pt reports interest in losing weight as well. Pt reports having an uncle die of uncontrolled diabetes, states they are scared it will happen to them.  Pt reports working from home, very sedentary during the day, pt states they don't leave the house much, has an exercise bike at home but never uses it. Pt reports not eating much, misses breakfast, first meal around 11:00 and only 2 meals a day. Pt reports craving chocolate in the evenings, will have chocolate covered peanuts, chocolate covered cherries, ice cream, candy, etc. Pt reports history of drinking beer daily, has cut back to a couple on the weekends since DM diagnosis. Pt reports high stress level from work, doing things for others all the time.  There were no vitals taken for this visit. There is no height or weight on file to calculate BMI.   Diabetes Self-Management Education - 06/09/23 1412       Visit Information   Visit Type First/Initial      Initial Visit   Diabetes Type Type 2    Date Diagnosed 01/2023    Are you currently following a meal plan? No      Health Coping   How would you rate your overall health? Good      Psychosocial Assessment   Patient Belief/Attitude about Diabetes Afraid   Fear of complications   What is the hardest part about your diabetes right now, causing you the most concern, or is the most worrisome to you about your diabetes?   Getting support / problem solving;Making healty food and beverage choices    Self-care barriers None    Self-management support Doctor's office    Other persons present Patient    Patient Concerns Nutrition/Meal planning;Weight Control;Medication     Special Needs None    Preferred Learning Style No preference indicated    Learning Readiness Ready    How often do you need to have someone help you when you read instructions, pamphlets, or other written materials from your doctor or pharmacy? 1 - Never    What is the last grade level you completed in school? 12th grade      Pre-Education Assessment   Patient understands the diabetes disease and treatment process. Needs Instruction    Patient understands incorporating nutritional management into lifestyle. Needs Instruction    Patient undertands incorporating physical activity into lifestyle. Needs Instruction    Patient understands using medications safely. Needs Instruction    Patient understands monitoring blood glucose, interpreting and using results Needs Instruction    Patient understands prevention, detection, and treatment of acute complications. Needs Instruction    Patient understands prevention, detection, and treatment of chronic complications. Needs Instruction    Patient understands how to develop strategies to address psychosocial issues. Needs Instruction    Patient understands how to develop strategies to promote health/change behavior. Needs Instruction      Complications   Last HgB A1C per patient/outside source 6.6 %   02/28/2023   How often do you check your blood sugar? 0 times/day (not testing)    Have you had a dilated eye exam in the past 12 months? No    Have you had  a dental exam in the past 12 months? Yes    Are you checking your feet? Yes    How many days per week are you checking your feet? 7      Dietary Intake   Breakfast coffee w/ french vanilla cream and honey    Dinner Beef roast, spinach    Beverage(s) water      Activity / Exercise   Activity / Exercise Type ADL's    How many days per week do you exercise? 0    How many minutes per day do you exercise? 0    Total minutes per week of exercise 0      Patient Education   Previous Diabetes  Education No    Disease Pathophysiology Factors that contribute to the development of diabetes;Explored patient's options for treatment of their diabetes    Healthy Eating Role of diet in the treatment of diabetes and the relationship between the three main macronutrients and blood glucose level;Plate Method    Being Active Role of exercise on diabetes management, blood pressure control and cardiac health.    Medications Reviewed patients medication for diabetes, action, purpose, timing of dose and side effects.    Monitoring Identified appropriate SMBG and/or A1C goals.    Chronic complications Relationship between chronic complications and blood glucose control;Retinopathy and reason for yearly dilated eye exams    Diabetes Stress and Support Role of stress on diabetes    Lifestyle and Health Coping Lifestyle issues that need to be addressed for better diabetes care   Sedentary     Individualized Goals (developed by patient)   Nutrition General guidelines for healthy choices and portions discussed    Physical Activity Not Applicable    Medications take my medication as prescribed    Monitoring  Not Applicable    Problem Solving Social situations      Post-Education Assessment   Patient understands the diabetes disease and treatment process. Needs Review    Patient understands incorporating nutritional management into lifestyle. Needs Review    Patient undertands incorporating physical activity into lifestyle. Needs Review    Patient understands using medications safely. Needs Review    Patient understands monitoring blood glucose, interpreting and using results N/A    Patient understands prevention, detection, and treatment of acute complications. Needs Review    Patient understands prevention, detection, and treatment of chronic complications. Needs Review    Patient understands how to develop strategies to address psychosocial issues. Needs Review    Patient understands how to develop  strategies to promote health/change behavior. Needs Review      Outcomes   Expected Outcomes Demonstrated interest in learning but significant barriers to change    Future DMSE 2 months    Program Status Not Completed             Individualized Plan for Diabetes Self-Management Training:   Learning Objective:  Patient will have a greater understanding of diabetes self-management. Patient education plan is to attend individual and/or group sessions per assessed needs and concerns.   Plan:   Patient Instructions  Inform your eye doctor of your recent diagnosis of diabetes at your eye exam tomorrow!  Try adding some coffee flavored protein to your morning coffee to start your day with some nutrition.  Try sugar-free Chocolate Jello pudding in place of ice cream or other chocolate. You can add some cool whip to it if you like. Try sugar-free chocolates as well!!  During your work day, get up  and move around every 2 hours for about 5 minutes! Get up at 10:30, 12:30, 2:30 (lunch break), and 5:30 when you're done  Work towards eating three meals a day, about 5-6 hours apart!  Begin to build your meals using the proportions of the Balanced Plate. First, select your carb choice(s) for the meal. Make this 25% of your meal. Next, select your source of protein to pair with your carb choice(s). Make this another 25% of your meal. Finally, complete your meal with a variety of non-starchy vegetables. Make this the remaining 50% of your meal.'  Expected Outcomes:  Demonstrated interest in learning but significant barriers to change  Education material provided: ADA - How to Thrive: A Guide for Your Journey with Diabetes and Carbohydrate counting sheet  If problems or questions, patient to contact team via:  Phone and Email  Future DSME appointment: 2 months

## 2023-06-09 NOTE — Patient Instructions (Addendum)
Inform your eye doctor of your recent diagnosis of diabetes at your eye exam tomorrow!  Try adding some coffee flavored protein to your morning coffee to start your day with some nutrition.  Try sugar-free Chocolate Jello pudding in place of ice cream or other chocolate. You can add some cool whip to it if you like. Try sugar-free chocolates as well!!  During your work day, get up and move around every 2 hours for about 5 minutes! Get up at 10:30, 12:30, 2:30 (lunch break), and 5:30 when you're done  Work towards eating three meals a day, about 5-6 hours apart!  Begin to build your meals using the proportions of the Balanced Plate. First, select your carb choice(s) for the meal. Make this 25% of your meal. Next, select your source of protein to pair with your carb choice(s). Make this another 25% of your meal. Finally, complete your meal with a variety of non-starchy vegetables. Make this the remaining 50% of your meal.'

## 2023-06-10 ENCOUNTER — Encounter: Payer: Self-pay | Admitting: Family

## 2023-06-10 NOTE — Telephone Encounter (Signed)
Scheduled mcvv for 06/11/23

## 2023-06-11 ENCOUNTER — Telehealth: Payer: Managed Care, Other (non HMO) | Admitting: Family

## 2023-06-11 ENCOUNTER — Encounter: Payer: Self-pay | Admitting: Family

## 2023-06-11 VITALS — Ht 66.0 in | Wt 185.2 lb

## 2023-06-11 DIAGNOSIS — J40 Bronchitis, not specified as acute or chronic: Secondary | ICD-10-CM | POA: Diagnosis not present

## 2023-06-11 MED ORDER — GUAIFENESIN-CODEINE 100-10 MG/5ML PO SOLN: 5.0000 mL | Freq: Every evening | ORAL | 0 refills | Status: DC | PRN

## 2023-06-11 NOTE — Assessment & Plan Note (Addendum)
Duration 5 days.  Afebrile.  No acute respiratory distress over virtual visit today.  Advised patient more likely viral in etiology.  Reviewed urgent care evaluation.  Advised continuation of Mucinex 600 mg daily.  Provided her with 30 days cough syrup to use at bedtime.  Counseled on sedation and avoidance of alcohol.  Advised to store in a safe place.  She has done well on codeine syrup before.  If symptoms persist, patient will let me know and I would recommend starting azithromycin

## 2023-06-11 NOTE — Patient Instructions (Signed)
Continue  of Mucinex 600 mg daily.  Plenty of water.  I provided you with a codeine-based cough syrup. Please take cough medication at night only as needed. As we discussed, I do not recommend dosing throughout the day as coughing is a protective mechanism . It also helps to break up thick mucous.  Do not take cough suppressants with alcohol as can lead to trouble breathing. Advise caution if taking cough suppressant and operating machinery ( i.e driving a car) as you may feel very tired.      Please let me know if symptoms persist or worsen is at that point I would recommend an antibiotic.

## 2023-06-11 NOTE — Progress Notes (Signed)
Virtual Visit via Video Note  I connected with Jaclyn Lin on 06/11/23 at 12:30 PM EST by a video enabled telemedicine application and verified that I am speaking with the correct person using two identifiers. Location patient: home Location provider: work  Persons participating in the virtual visit: patient, provider  I discussed the limitations of evaluation and management by telemedicine and the availability of in person appointments. The patient expressed understanding and agreed to proceed.  HPI: Complains of productive clear sputum cough x 5 days , improving.   Cough is worse at bedtime.   Intermittent wheezing when laying down with improvement with albuterol.   She describes stomach is sore from coughing.   No fever, sinus pain, sore throat, SOB.  She took one half mucinex 600mg  yesterday.   UC 06/08/23, tessalon, promethazine DM Negative COVID, influenza.  Chest x-ray no active cardiopulmonary disease  She has had codeine in the past and done well. Codeine doesn't always make her itch.    ROS: See pertinent positives and negatives per HPI.  EXAM:  VITALS per patient if applicable: Ht 5\' 6"  (1.676 m)   Wt 185 lb 3.2 oz (84 kg)   BMI 29.89 kg/m  BP Readings from Last 3 Encounters:  06/08/23 (!) 142/88  05/01/23 128/78  04/14/23 136/80   Wt Readings from Last 3 Encounters:  06/11/23 185 lb 3.2 oz (84 kg)  06/08/23 185 lb 10 oz (84.2 kg)  05/01/23 185 lb 9.6 oz (84.2 kg)    GENERAL: alert, oriented, appears well and in no acute distress  HEENT: atraumatic, conjunttiva clear, no obvious abnormalities on inspection of external nose and ears  NECK: normal movements of the head and neck  LUNGS: on inspection no signs of respiratory distress, breathing rate appears normal, no obvious gross SOB, gasping or wheezing  CV: no obvious cyanosis  MS: moves all visible extremities without noticeable abnormality  PSYCH/NEURO: pleasant and cooperative, no  obvious depression or anxiety, speech and thought processing grossly intact  ASSESSMENT AND PLAN: Bronchitis Assessment & Plan: Duration 5 days.  Afebrile.  No acute respiratory distress over virtual visit today.  Advised patient more likely viral in etiology.  Reviewed urgent care evaluation.  Advised continuation of Mucinex 600 mg daily.  Provided her with 30 days cough syrup to use at bedtime.  Counseled on sedation and avoidance of alcohol.  Advised to store in a safe place.  She has done well on codeine syrup before.  If symptoms persist, patient will let me know and I would recommend starting azithromycin  Orders: -     guaiFENesin-Codeine; Take 5 mLs by mouth at bedtime as needed for cough.  Dispense: 60 mL; Refill: 0     -we discussed possible serious and likely etiologies, options for evaluation and workup, limitations of telemedicine visit vs in person visit, treatment, treatment risks and precautions. Pt prefers to treat via telemedicine empirically rather then risking or undertaking an in person visit at this moment.    I discussed the assessment and treatment plan with the patient. The patient was provided an opportunity to ask questions and all were answered. The patient agreed with the plan and demonstrated an understanding of the instructions.   The patient was advised to call back or seek an in-person evaluation if the symptoms worsen or if the condition fails to improve as anticipated.  Advised if desired AVS can be mailed or viewed via MyChart if Mychart user.   Rennie Plowman, FNP

## 2023-06-12 ENCOUNTER — Ambulatory Visit: Payer: Managed Care, Other (non HMO) | Admitting: Family

## 2023-07-18 ENCOUNTER — Encounter: Payer: Self-pay | Admitting: Family

## 2023-07-18 ENCOUNTER — Ambulatory Visit: Payer: Managed Care, Other (non HMO) | Admitting: Family

## 2023-07-18 ENCOUNTER — Telehealth: Payer: Self-pay | Admitting: Pharmacist

## 2023-07-18 VITALS — BP 136/72 | HR 90 | Temp 98.2°F | Ht 66.0 in | Wt 181.6 lb

## 2023-07-18 DIAGNOSIS — G479 Sleep disorder, unspecified: Secondary | ICD-10-CM | POA: Insufficient documentation

## 2023-07-18 DIAGNOSIS — Z7985 Long-term (current) use of injectable non-insulin antidiabetic drugs: Secondary | ICD-10-CM | POA: Diagnosis not present

## 2023-07-18 DIAGNOSIS — E118 Type 2 diabetes mellitus with unspecified complications: Secondary | ICD-10-CM | POA: Diagnosis not present

## 2023-07-18 DIAGNOSIS — R7309 Other abnormal glucose: Secondary | ICD-10-CM

## 2023-07-18 LAB — POCT GLYCOSYLATED HEMOGLOBIN (HGB A1C): Hemoglobin A1C: 6.4 % — AB (ref 4.0–5.6)

## 2023-07-18 MED ORDER — TRAZODONE HCL 50 MG PO TABS: 25.0000 mg | ORAL_TABLET | Freq: Every day | ORAL | 3 refills | Status: AC

## 2023-07-18 MED ORDER — SEMAGLUTIDE(0.25 OR 0.5MG/DOS) 2 MG/3ML ~~LOC~~ SOPN: 0.2500 mg | PEN_INJECTOR | SUBCUTANEOUS | 2 refills | Status: DC

## 2023-07-18 NOTE — Assessment & Plan Note (Addendum)
Well-controlled.  Patient desires to lose weight and  is interested in trial of Ozempic.  Discussed we could simplify regimen and she could trial stop Jardiance 10 mg for now while we monitor urine micro, and instead try Ozempic 0.25 mg weekly.  Counseled on blackbox warning as a relates to multiple endocrine neoplasia, medullary thyroid cancer, side effects and titration.  Close follow-up.

## 2023-07-18 NOTE — Telephone Encounter (Signed)
Pharmacy Patient Advocate Encounter   Received notification from Physician's Office that prior authorization for Ozempic (0.25 or 0.5 MG/DOSE) 2MG /3ML pen-injectors is required/requested.   Insurance verification completed.   The patient is insured through [facility] .   Per test claim: PA required; PA submitted to above mentioned insurance via CoverMyMeds Key/confirmation #/EOC [id number] Status is pending

## 2023-07-18 NOTE — Assessment & Plan Note (Addendum)
Discussed inadequate sleep. Suspect contributing to fatigue.  Patient did not have home sleep study for evaluation of sleep apnea.  Will reiterate the importance of this at follow-up   trial of trazodone.  Encouraged walking

## 2023-07-18 NOTE — Progress Notes (Signed)
Assessment & Plan:  Controlled type 2 diabetes mellitus with complication, without long-term current use of insulin (HCC) Assessment & Plan: Well-controlled.  Patient desires to lose weight and  is interested in trial of Ozempic.  Discussed we could simplify regimen and she could trial stop Jardiance 10 mg for now while we monitor urine micro, and instead try Ozempic 0.25 mg weekly.  Counseled on blackbox warning as a relates to multiple endocrine neoplasia, medullary thyroid cancer, side effects and titration.  Close follow-up.  Orders: -     Semaglutide(0.25 or 0.5MG /DOS); Inject 0.25 mg into the skin once a week.  Dispense: 3 mL; Refill: 2  Elevated glucose -     POCT glycosylated hemoglobin (Hb A1C)  Trouble in sleeping Assessment & Plan: Discussed inadequate sleep. Suspect contributing to fatigue.  Patient did not have home sleep study for evaluation of sleep apnea.  Will reiterate the importance of this at follow-up   trial of trazodone.  Encouraged walking   Orders: -     traZODone HCl; Take 0.5-1 tablets (25-50 mg total) by mouth at bedtime.  Dispense: 90 tablet; Refill: 3     Return precautions given.   Risks, benefits, and alternatives of the medications and treatment plan prescribed today were discussed, and patient expressed understanding.   Education regarding symptom management and diagnosis given to patient on AVS either electronically or printed.  No follow-ups on file.  Jaclyn Plowman, FNP  Subjective:    Patient ID: Jaclyn Lin, female    DOB: [birth year], 58 y.o.   MRN: [id number]  CC: Jaclyn Lin is a 58 y.o. female who presents today for follow up.   HPI: She is interested trial of Ozempic.  She interested in losing weight.  She has trouble falling and staying asleep. She may get 4-5 hours per night.  Endorses fatigue . she has tried tylenol PM or SleepAid with some relief however doesn't stay asleep.   She is not exercising.       Denies personal or family history of thyroid cancer, multiple endocrine neoplasia  Mammogram up-to-date.  Cologuard is up-to-date  Due PCV20   Allergies: Codeine Current Outpatient Medications on File Prior to Visit  Medication Sig Dispense Refill   albuterol (VENTOLIN HFA) 108 (90 Base) MCG/ACT inhaler INHALE 2 PUFFS INTO THE LUNGS EVERY 6 HOURS AS NEEDED FOR WHEEZING OR SHORTNESS OF BREATH 8.5 g 3   ALPRAZolam (XANAX) 0.25 MG tablet Take 1 tablet (0.25 mg total) by mouth daily as needed for anxiety or sleep. 30 tablet 1   budesonide-formoterol (SYMBICORT) 80-4.5 MCG/ACT inhaler Inhale 2 puffs into the lungs 2 (two) times daily. 1 each 2   montelukast (SINGULAIR) 10 MG tablet Take 1 tablet (10 mg total) by mouth daily. 90 tablet 3   rosuvastatin (CRESTOR) 10 MG tablet Take one tablet by mouth every evening. 90 tablet 3   triamterene-hydrochlorothiazide (DYAZIDE) 37.5-25 MG capsule TAKE 1 CAPSULE BY MOUTH EVERY DAY 90 capsule 1   No current facility-administered medications on file prior to visit.    Review of Systems  Constitutional:  Positive for fatigue. Negative for chills and fever.  Respiratory:  Negative for cough.   Cardiovascular:  Negative for chest pain and palpitations.  Gastrointestinal:  Negative for nausea and vomiting.      Objective:    BP 136/72   Pulse 90   Temp 98.2 F (36.8 C) (Oral)   Ht 5\' 6"  (1.676 m)   Wt 181 lb 9.6  oz (82.4 kg)   SpO2 96%   BMI 29.31 kg/m  BP Readings from Last 3 Encounters:  07/18/23 136/72  06/08/23 (!) 142/88  05/01/23 128/78   Wt Readings from Last 3 Encounters:  07/18/23 181 lb 9.6 oz (82.4 kg)  06/11/23 185 lb 3.2 oz (84 kg)  06/08/23 185 lb 10 oz (84.2 kg)    Physical Exam Vitals reviewed.  Constitutional:      Appearance: She is well-developed.  Eyes:     Conjunctiva/sclera: Conjunctivae normal.  Cardiovascular:     Rate and Rhythm: Normal rate and regular rhythm.     Pulses: Normal pulses.      Heart sounds: Normal heart sounds.  Pulmonary:     Effort: Pulmonary effort is normal.     Breath sounds: Normal breath sounds. No wheezing, rhonchi or rales.  Skin:    General: Skin is warm and dry.  Neurological:     Mental Status: She is alert.  Psychiatric:        Speech: Speech normal.        Behavior: Behavior normal.        Thought Content: Thought content normal.

## 2023-07-18 NOTE — Patient Instructions (Addendum)
Trial stop of jardiance while we start ozempic.   Start trazodone to help with sleep  Start ozempic 0.25mg  once per week once per week injected subcutaneously ( [place])  in stomach. Please clean with alcohol swab prior to injection and be sure to rotate site. You may schedule a nurse visit if you would like to first injection.   After 4 weeks, and if tolerated and weight loss has not reached 1-2 lbs per week, please increase to 0.5mg  once per week [place].    Please read information on medication below and remember black box warning that you may not take if you or a family member is diagnosed with thyroid cancer (medullary thyroid cancer), or multiple endocrine neoplasia ( MEN).       Semaglutide injection solution What is this medicine? SEMAGLUTIDE (Sem a GLOO tide) is used to improve blood sugar control in adults with type 2 diabetes. This medicine may be used with other diabetes medicines. This drug may also reduce the risk of heart attack or stroke if you have type 2 diabetes and risk factors for heart disease. This medicine may be used for other purposes; ask your health care provider or pharmacist if you have questions. COMMON BRAND NAME(S): OZEMPIC What should I tell my health care provider before I take this medicine? They need to know if you have any of these conditions: endocrine tumors (MEN 2) or if someone in your family had these tumors eye disease, vision problems history of pancreatitis kidney disease stomach problems thyroid cancer or if someone in your family had thyroid cancer an unusual or allergic reaction to semaglutide, other medicines, foods, dyes, or preservatives pregnant or trying to get pregnant breast-feeding How should I use this medicine? This medicine is for injection under the skin of your upper leg (thigh), stomach area, or upper arm. It is given once every week (every 7 days). You will be taught how to prepare and give this medicine. Use exactly as directed.  Take your medicine at regular intervals. Do not take it more often than directed. If you use this medicine with insulin, you should inject this medicine and the insulin separately. Do not mix them together. Do not give the injections right next to each other. Change (rotate) injection sites with each injection. It is important that you put your used needles and syringes in a special sharps container. Do not put them in a trash can. If you do not have a sharps container, call your pharmacist or healthcare provider to get one. A special MedGuide will be given to you by the pharmacist with each prescription and refill. Be sure to read this information carefully each time. This drug comes with INSTRUCTIONS FOR USE. Ask your pharmacist for directions on how to use this drug. Read the information carefully. Talk to your pharmacist or health care provider if you have questions. Talk to your pediatrician regarding the use of this medicine in children. Special care may be needed. Overdosage: If you think you have taken too much of this medicine contact a poison control center or emergency room at once. NOTE: This medicine is only for you. Do not share this medicine with others. What if I miss a dose? If you miss a dose, take it as soon as you can within 5 days after the missed dose. Then take your next dose at your regular weekly time. If it has been longer than 5 days after the missed dose, do not take the missed dose. Take the  next dose at your regular time. Do not take double or extra doses. If you have questions about a missed dose, contact your health care provider for advice. What may interact with this medicine? other medicines for diabetes Many medications may cause changes in blood sugar, these include: alcohol containing beverages antiviral medicines for HIV or AIDS aspirin and aspirin-like drugs certain medicines for blood pressure, heart disease, irregular heart beat chromium diuretics female  hormones, such as estrogens or progestins, birth control pills fenofibrate gemfibrozil isoniazid lanreotide female hormones or anabolic steroids MAOIs like Carbex, Eldepryl, Marplan, Nardil, and Parnate medicines for weight loss medicines for allergies, asthma, cold, or cough medicines for depression, anxiety, or psychotic disturbances niacin nicotine NSAIDs, medicines for pain and inflammation, like ibuprofen or naproxen octreotide pasireotide pentamidine phenytoin probenecid quinolone antibiotics such as ciprofloxacin, levofloxacin, ofloxacin some herbal dietary supplements steroid medicines such as prednisone or cortisone sulfamethoxazole; trimethoprim thyroid hormones Some medications can hide the warning symptoms of low blood sugar (hypoglycemia). You may need to monitor your blood sugar more closely if you are taking one of these medications. These include: beta-blockers, often used for high blood pressure or heart problems (examples include atenolol, metoprolol, propranolol) clonidine guanethidine reserpine This list may not describe all possible interactions. Give your health care provider a list of all the medicines, herbs, non-prescription drugs, or dietary supplements you use. Also tell them if you smoke, drink alcohol, or use illegal drugs. Some items may interact with your medicine. What should I watch for while using this medicine? Visit your doctor or health care professional for regular checks on your progress. Drink plenty of fluids while taking this medicine. Check with your doctor or health care professional if you get an attack of severe diarrhea, nausea, and vomiting. The loss of too much body fluid can make it dangerous for you to take this medicine. A test called the HbA1C (A1C) will be monitored. This is a simple blood test. It measures your blood sugar control over the last 2 to 3 months. You will receive this test every 3 to 6 months. Learn how to check your  blood sugar. Learn the symptoms of low and high blood sugar and how to manage them. Always carry a quick-source of sugar with you in case you have symptoms of low blood sugar. Examples include hard sugar candy or glucose tablets. Make sure others know that you can choke if you eat or drink when you develop serious symptoms of low blood sugar, such as seizures or unconsciousness. They must get medical help at once. Tell your doctor or health care professional if you have high blood sugar. You might need to change the dose of your medicine. If you are sick or exercising more than usual, you might need to change the dose of your medicine. Do not skip meals. Ask your doctor or health care professional if you should avoid alcohol. Many nonprescription cough and cold products contain sugar or alcohol. These can affect blood sugar. Pens should never be shared. Even if the needle is changed, sharing may result in passing of viruses like hepatitis or HIV. Wear a medical ID bracelet or chain, and carry a card that describes your disease and details of your medicine and dosage times. Do not become pregnant while taking this medicine. Women should inform their doctor if they wish to become pregnant or think they might be pregnant. There is a potential for serious side effects to an unborn child. Talk to your health care professional  or pharmacist for more information. What side effects may I notice from receiving this medicine? Side effects that you should report to your doctor or health care professional as soon as possible: allergic reactions like skin rash, itching or hives, swelling of the face, lips, or tongue breathing problems changes in vision diarrhea that continues or is severe lump or swelling on the neck severe nausea signs and symptoms of infection like fever or chills; cough; sore throat; pain or trouble passing urine signs and symptoms of low blood sugar such as feeling anxious, confusion,  dizziness, increased hunger, unusually weak or tired, sweating, shakiness, cold, irritable, headache, blurred vision, fast heartbeat, loss of consciousness signs and symptoms of kidney injury like trouble passing urine or change in the amount of urine trouble swallowing unusual stomach upset or pain vomiting Side effects that usually do not require medical attention (report to your doctor or health care professional if they continue or are bothersome): constipation diarrhea nausea pain, redness, or irritation at site where injected stomach upset This list may not describe all possible side effects. Call your doctor for medical advice about side effects. You may report side effects to FDA at 1-800-FDA-1088. Where should I keep my medicine? Keep out of the reach of children. Store unopened pens in a refrigerator between 2 and 8 degrees C (36 and 46 degrees F). Do not freeze. Protect from light and heat. After you first use the pen, it can be stored for 56 days at room temperature between 15 and 30 degrees C (59 and 86 degrees F) or in a refrigerator. Throw away your used pen after 56 days or after the expiration date, whichever comes first. Do not store your pen with the needle attached. If the needle is left on, medicine may leak from the pen. NOTE: This sheet is a summary. It may not cover all possible information. If you have questions about this medicine, talk to your doctor, pharmacist, or health care provider.  2021 Elsevier/Gold Standard (2019-02-23 09:41:51)  This is  Dr. Melina Schools  example of a  "Low GI"  Diet:  It will allow you to lose 4 to 8  lbs  per month if you follow it carefully.  Your goal with exercise is a minimum of 30 minutes of aerobic exercise 5 days per week (Walking does not count once it becomes easy!)    All of the foods can be found at grocery stores and in bulk at [employer].  The Atkins protein bars and shakes are available in more varieties at Target, Wal[employer] and  Lowe's Foods.     7 AM Breakfast:  Choose from the following:  Low carbohydrate Protein  Shakes (I recommend the  Premier Protein chocolate shakes,  EAS AdvantEdge "Carb Control" shakes  Or the Atkins shakes all are under 3 net carbs)     a scrambled egg/bacon/cheese burrito made with Mission's "carb balance" whole wheat tortilla  (about 10 net carbs )  Medical laboratory scientific officer (basically a quiche without the pastry crust) that is eaten cold and very convenient way to get your eggs.  8 carbs)  If you make your own protein shakes, avoid bananas and pineapple,  And use low carb greek yogurt or original /unsweetened almond or soy milk    Avoid cereal and bananas, oatmeal and cream of wheat and grits. They are loaded with carbohydrates!   10 AM: high protein snack:  Protein bar by Atkins (the snack size, under 200 cal,  usually < 6 net carbs).    A stick of cheese:  Around 1 carb,  100 cal     Dannon Light n Fit [place] Yogurt  (80 cal, 8 carbs)  Other so called "protein bars" and Greek yogurts tend to be loaded with carbohydrates.  Remember, in food advertising, the word "energy" is synonymous for " carbohydrate."  Lunch:   A Sandwich using the bread choices listed, Can use any  Eggs,  lunchmeat, grilled meat or canned tuna), avocado, regular mayo/mustard  and cheese.  A Salad using blue cheese, ranch,  Goddess or vinagrette,  Avoid taco shells, croutons or "confetti" and no "candied nuts" but regular nuts OK.   No pretzels, nabs  or chips.  Pickles and miniature sweet peppers are a good low carb alternative that provide a "crunch"  The bread is the only source of carbohydrate in a sandwich and  can be decreased by trying some of the attached alternatives to traditional loaf bread   Avoid "Low fat dressings, as well as Reyne Dumas and [employer] dressings They are loaded with sugar!   3 PM/ Mid day  Snack:  Consider  1 ounce of  almonds, walnuts, pistachios, pecans, peanuts,   Macadamia nuts or a nut medley.  Avoid "granola and granola bars "  Mixed nuts are ok in moderation as long as there are no raisins,  cranberries or dried fruit.   KIND bars are OK if you get the low glycemic index variety   Try the prosciutto/mozzarella cheese sticks by [employer]  In deli /backery section   High protein      6 PM  Dinner:     Meat/fowl/fish with a green salad, and either broccoli, cauliflower, green beans, spinach, brussel sprouts or  Lima beans. DO NOT BREAD THE PROTEIN!!      There is a low carb pasta by Dreamfield's that is acceptable and tastes great: only 5 digestible carbs/serving.( All grocery stores but BJs carry it ) Several ready made meals are available low carb:   Try Michel Angelo's chicken piccata or chicken or eggplant parm over low carb pasta.(Lowes and BJs)   Clifton Custard Sanchez's "Carnitas" (pulled pork, no sauce,  0 carbs) or his beef pot roast to make a dinner burrito (at BJ's)  Pesto over low carb pasta (bj's sells a good quality pesto in the center refrigerated section of the deli   Try satueeing  Roosvelt Harps with mushroooms as a good side   [employer] Giant makes a mashed cauliflower that tastes like mashed potatoes  Whole wheat pasta is still full of digestible carbs and  Not as low in glycemic index as Dreamfield's.   Brown rice is still rice,  So skip the rice and noodles if you eat [place] or [place] (or at least limit to 1/2 cup)  9 PM snack :   Breyer's "low carb" fudgsicle or  ice cream bar (Carb Smart line), or  Weight Watcher's ice cream bar , or another "no sugar added" ice cream;  a serving of fresh berries/cherries with whipped cream   Cheese or DANNON'S LlGHT N FIT GREEK YOGURT  8 ounces of [place] unsweetened almond/cococunut milk    Treat yourself to a parfait made with whipped cream blueberiies, walnuts and vanilla greek yogurt  Avoid bananas, pineapple, grapes  and watermelon on a regular basis because they are high in sugar.  THINK OF THEM AS  DESSERT  Remember that snack Substitutions should be less than 10 NET  carbs per serving and meals < 20 carbs. Remember to subtract fiber grams to get the "net carbs."

## 2023-07-21 NOTE — Telephone Encounter (Signed)
Spoke to pt and informed her that her Ozempic has been approved

## 2023-07-21 NOTE — Telephone Encounter (Signed)
Pharmacy Patient Advocate Encounter  Received notification from [facility] that Prior Authorization for Ozempic (0.25 or 0.5 MG/DOSE) 2MG /3ML pen-injectors has been APPROVED from 07/18/2023 to 07/17/2024.   PA #/Case ID/Reference #: [id number]

## 2023-08-15 ENCOUNTER — Ambulatory Visit: Payer: Managed Care, Other (non HMO) | Admitting: Dietician

## 2023-09-04 ENCOUNTER — Encounter: Payer: Self-pay | Admitting: Family

## 2023-09-04 ENCOUNTER — Other Ambulatory Visit: Payer: Self-pay | Admitting: Family

## 2023-09-04 DIAGNOSIS — R899 Unspecified abnormal finding in specimens from other organs, systems and tissues: Secondary | ICD-10-CM

## 2023-09-29 ENCOUNTER — Other Ambulatory Visit: Payer: Self-pay | Admitting: Family

## 2023-09-29 DIAGNOSIS — I10 Essential (primary) hypertension: Secondary | ICD-10-CM

## 2023-10-03 ENCOUNTER — Ambulatory Visit
Admission: RE | Admit: 2023-10-03 | Discharge: 2023-10-03 | Disposition: A | Discharge status: Home / Self Care | Source: Ambulatory Visit | Attending: Physician Assistant | Admitting: Physician Assistant

## 2023-10-03 VITALS — BP 147/64 | HR 67 | Temp 99.2°F | Resp 14 | Ht 66.0 in | Wt 181.7 lb

## 2023-10-03 DIAGNOSIS — R220 Localized swelling, mass and lump, head: Secondary | ICD-10-CM | POA: Diagnosis not present

## 2023-10-03 DIAGNOSIS — L03213 Periorbital cellulitis: Secondary | ICD-10-CM

## 2023-10-03 MED ORDER — AMOXICILLIN-POT CLAVULANATE 875-125 MG PO TABS: 1.0000 | ORAL_TABLET | Freq: Two times a day (BID) | ORAL | 0 refills | Status: AC

## 2023-10-03 MED ORDER — PREDNISONE 20 MG PO TABS: 40.0000 mg | ORAL_TABLET | Freq: Every day | ORAL | 0 refills | Status: AC

## 2023-10-03 NOTE — ED Triage Notes (Signed)
 Patient states that she woke up this morning with swelling on the left side of her face.

## 2023-10-03 NOTE — Discharge Instructions (Addendum)
-  Possible infection versus allergic reaction. -Begin antibiotics and corticosteroids.  Ice face to help with swelling. - Take Benadryl when you get home. - If fever, increased swelling, increased or worsening redness, worsening pain please go to emergency department.

## 2023-10-03 NOTE — ED Provider Notes (Signed)
 [facility]    CSN: [id number] Arrival date & time: 10/03/23  1829      History   Chief Complaint Chief Complaint  Patient presents with   Facial Swelling    HPI Jaclyn Lin is a 58 y.o. female presenting for left-sided facial swelling upon waking up this morning.  Symptoms of gotten a little worse throughout the day.  Denies pruritus.  Reports mild pain/discomfort.  Denies pain of eye, eye redness or drainage.  No injury.  Denies dental pain, sore throat, difficulty swallowing, difficulty breathing.  No changes to detergents, body washes, facial scrubs, make-ups.  Has not treated condition yet in any way.  States she went to work and did not want to take Benadryl while working since it makes her tired.  No history of similar.  No other complaints.  Medical history significant for hypertension, diabetes and asthma.  Patient is also very daily smoker.  HPI  Past Medical History:  Diagnosis Date   Asthma    childhood   Hypertension    UTI (lower urinary tract infection)     Patient Active Problem List   Diagnosis Date Noted   Trouble in sleeping 07/18/2023   Hematuria 05/01/2023   Loud snoring 04/14/2023   Headache 03/13/2023   COVID-19 05/15/2022   Lung nodule 04/30/2022   Liver lesion 04/30/2022   Aortic atherosclerosis (HCC) 04/30/2022   Groin pain 12/11/2020   History of hysterectomy 03/29/2020   Hyperlipidemia associated with type 2 diabetes mellitus (HCC) 03/29/2020   Arthralgia 02/11/2020   Breast mass, right 05/17/2019   Suprapubic pressure 02/18/2019   Left knee pain 02/17/2019   Right arm pain 09/07/2018   Screening for breast cancer 09/07/2018   Shortness of breath 03/09/2018   Uncomplicated asthma 01/23/2018   Soft tissue mass 01/23/2018   Gastroesophageal reflux disease 02/10/2017   BPPV (benign paroxysmal positional vertigo) 09/11/2015   Chronic fatigue 01/27/2015   Routine general medical examination at a health care facility  07/21/2014   Controlled diabetes mellitus type 2 with complications (HCC) 12/17/2013   Anxiety 04/29/2012   Bronchitis 03/12/2012   Hypertension 05/24/2011    Past Surgical History:  Procedure Laterality Date   ABDOMINAL HYSTERECTOMY     partial, Dr. 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     VAGINAL DELIVERY     2    OB History   No obstetric history on file.      Home Medications    Prior to Admission medications   Medication Sig Start Date End Date Taking? Authorizing Provider  amoxicillin-clavulanate (AUGMENTIN) 875-125 MG tablet Take 1 tablet by mouth every 12 (twelve) hours for 7 days. 10/03/23 10/10/23 Yes Shirlee Latch, PA-C  predniSONE (DELTASONE) 20 MG tablet Take 2 tablets (40 mg total) by mouth daily for 5 days. 10/03/23 10/08/23 Yes Eusebio Friendly B, PA-C  albuterol (VENTOLIN HFA) 108 (90 Base) MCG/ACT inhaler INHALE 2 PUFFS INTO THE LUNGS EVERY 6 HOURS AS NEEDED FOR WHEEZING OR SHORTNESS OF BREATH 04/03/23   Allegra Grana, FNP  ALPRAZolam Prudy Feeler) 0.25 MG tablet Take 1 tablet (0.25 mg total) by mouth daily as needed for anxiety or sleep. 04/09/23   Allegra Grana, FNP  budesonide-formoterol (SYMBICORT) 80-4.5 MCG/ACT inhaler Inhale 2 puffs into the lungs 2 (two) times daily. 05/20/23   Allegra Grana, FNP  montelukast (SINGULAIR) 10 MG tablet Take 1 tablet (10 mg total) by mouth daily. 04/09/23   Arnett,  Lyn Records, FNP  rosuvastatin (CRESTOR) 10 MG tablet Take one tablet by mouth every evening. 05/20/23   Allegra Grana, FNP  Semaglutide,0.25 or 0.5MG /DOS, 2 MG/3ML SOPN Inject 0.25 mg into the skin once a week. 07/18/23   Allegra Grana, FNP  traZODone (DESYREL) 50 MG tablet Take 0.5-1 tablets (25-50 mg total) by mouth at bedtime. 07/18/23   Allegra Grana, FNP  triamterene-hydrochlorothiazide (DYAZIDE) 37.5-25 MG capsule TAKE 1 CAPSULE BY MOUTH EVERY DAY 09/29/23   Allegra Grana, FNP    Family  History Family History  Problem Relation Age of Onset   Hypertension Mother    Asthma Maternal Grandmother    Brain cancer Maternal Grandmother    Cancer Maternal Grandfather        bone   Asthma Son    Colon cancer Neg Hx    Thyroid cancer Neg Hx     Social History Social History   Tobacco Use   Smoking status: Every Day    Current packs/day: 0.75    Average packs/day: 0.8 packs/day for 39.4 years (29.6 ttl pk-yrs)    Types: Cigarettes    Start date: 02/11/2016   Smokeless tobacco: Never   Tobacco comments:    smoking every couple of days  Vaping Use   Vaping status: Never Used  Substance Use Topics   Alcohol use: Yes    Comment: Beer on weekends; 6 pack   Drug use: No     Allergies   Codeine   Review of Systems Review of Systems  Constitutional:  Negative for fatigue and fever.  HENT:  Positive for facial swelling. Negative for congestion, dental problem, rhinorrhea, sore throat and trouble swallowing.   Skin:  Positive for color change and rash. Negative for wound.  Neurological:  Negative for dizziness, weakness and headaches.     Physical Exam Triage Vital Signs ED Triage Vitals  Encounter Vitals Group     BP 10/03/23 1844 (!) 147/64     Systolic BP Percentile --      Diastolic BP Percentile --      Pulse Rate 10/03/23 1844 67     Resp 10/03/23 1844 14     Temp 10/03/23 1844 99.2 F (37.3 C)     Temp Source 10/03/23 1844 Oral     SpO2 10/03/23 1844 95 %     Weight 10/03/23 1842 181 lb 10.5 oz (82.4 kg)     Height 10/03/23 1842 5\' 6"  (1.676 m)     Head Circumference --      Peak Flow --      Pain Score 10/03/23 1842 2     Pain Loc --      Pain Education --      Exclude from Growth Chart --    No data found.  Updated Vital Signs BP (!) 147/64 (BP Location: Left Arm)   Pulse 67   Temp 99.2 F (37.3 C) (Oral)   Resp 14   Ht 5\' 6"  (1.676 m)   Wt 181 lb 10.5 oz (82.4 kg)   SpO2 95%   BMI 29.32 kg/m   Physical Exam Vitals and nursing  note reviewed.  Constitutional:      General: She is not in acute distress.    Appearance: Normal appearance. She is not ill-appearing or toxic-appearing.  HENT:     Head: Normocephalic and atraumatic.     Comments: See image included in chart.  There is erythema and edema of the left cheek  and left lower eyelid.  Tenderness of the left lower lid and maxillary region.  No vesicles or papules present.  No conjunctival injection or eye drainage.  No open wounds.    Nose: Nose normal.     Mouth/Throat:     Mouth: Mucous membranes are moist.     Pharynx: Oropharynx is clear.     Comments: No intraoral swelling Eyes:     General: No scleral icterus.       Right eye: No discharge.        Left eye: No discharge.     Conjunctiva/sclera: Conjunctivae normal.  Cardiovascular:     Rate and Rhythm: Normal rate and regular rhythm.     Heart sounds: Normal heart sounds.  Pulmonary:     Effort: Pulmonary effort is normal. No respiratory distress.     Breath sounds: Normal breath sounds.  Musculoskeletal:     Cervical back: Neck supple.  Skin:    General: Skin is dry.     Findings: Rash present.  Neurological:     General: No focal deficit present.     Mental Status: She is alert. Mental status is at baseline.     Motor: No weakness.     Gait: Gait normal.  Psychiatric:        Mood and Affect: Mood normal.        Behavior: Behavior normal.      UC Treatments / Results  Labs (all labs ordered are listed, but only abnormal results are displayed) Labs Reviewed - No data to display  EKG   Radiology No results found.  Procedures Procedures (including critical care time)  Medications Ordered in UC Medications - No data to display  Initial Impression / Assessment and Plan / UC Course  I have reviewed the triage vital signs and the nursing notes.  Pertinent labs & imaging results that were available during my care of the patient were reviewed by me and considered in my medical  decision making (see chart for details).   58 year old female with history of hypertension, diabetes and tobacco abuse presents for left cheek and lower eyelid swelling, redness and discomfort since this morning.  Denies insect bites or stings, contact with poison ivy, changes to daily habits or exposure to any allergens.  No treatment attempted so far.  Vitals reveal elevated blood pressure 147/64.  Patient takes Dyazide.  Advised to continue this.  Keep blood pressure log and follow with PCP if greater than 140/90.  Overall well-appearing.  No acute distress.  See image included in chart.  Presentation may be consistent with developing periorbital/preseptal cellulitis or allergic type reaction.  Will treat patient for both.  Sent Augmentin and prednisone to pharmacy.  Discussed use of cryotherapy and antihistamines.  Reviewed very close monitoring and thoroughly discussed ED precautions.   Final Clinical Impressions(s) / UC Diagnoses   Final diagnoses:  Preseptal cellulitis of left lower eyelid  Left facial swelling     Discharge Instructions      -Possible infection versus allergic reaction. -Begin antibiotics and corticosteroids.  Ice face to help with swelling. - Take Benadryl when you get home. - If fever, increased swelling, increased or worsening redness, worsening pain please go to emergency department.    ED Prescriptions     Medication Sig Dispense Auth. Provider   amoxicillin-clavulanate (AUGMENTIN) 875-125 MG tablet Take 1 tablet by mouth every 12 (twelve) hours for 7 days. 14 tablet Eusebio Friendly B, PA-C   predniSONE (DELTASONE) 20  MG tablet Take 2 tablets (40 mg total) by mouth daily for 5 days. 10 tablet Gareth Morgan      PDMP not reviewed this encounter.   Shirlee Latch, PA-C 10/03/23 1935

## 2023-10-06 ENCOUNTER — Telehealth: Payer: Self-pay | Admitting: Family Medicine

## 2023-10-06 ENCOUNTER — Telehealth: Payer: Self-pay

## 2023-10-06 MED ORDER — FLUCONAZOLE 150 MG PO TABS: 150.0000 mg | ORAL_TABLET | Freq: Once | ORAL | 0 refills | Status: AC

## 2023-10-06 NOTE — Telephone Encounter (Signed)
 Pt with yeast infection symptoms while taking antibiotics.  Diflucan sent to the pharmacy.   Fidel Huddle, DO

## 2024-01-21 ENCOUNTER — Encounter: Payer: Self-pay | Admitting: Emergency Medicine

## 2024-02-16 ENCOUNTER — Ambulatory Visit (INDEPENDENT_AMBULATORY_CARE_PROVIDER_SITE_OTHER): Admitting: Family

## 2024-02-16 ENCOUNTER — Encounter: Payer: Self-pay | Admitting: Family

## 2024-02-16 VITALS — BP 130/72 | HR 82 | Temp 98.8°F | Ht 66.0 in | Wt 189.6 lb

## 2024-02-16 DIAGNOSIS — J309 Allergic rhinitis, unspecified: Secondary | ICD-10-CM

## 2024-02-16 DIAGNOSIS — Z0000 Encounter for general adult medical examination without abnormal findings: Secondary | ICD-10-CM

## 2024-02-16 DIAGNOSIS — Z7984 Long term (current) use of oral hypoglycemic drugs: Secondary | ICD-10-CM

## 2024-02-16 DIAGNOSIS — I700 Atherosclerosis of aorta: Secondary | ICD-10-CM | POA: Diagnosis not present

## 2024-02-16 DIAGNOSIS — E118 Type 2 diabetes mellitus with unspecified complications: Secondary | ICD-10-CM | POA: Diagnosis not present

## 2024-02-16 DIAGNOSIS — Z0001 Encounter for general adult medical examination with abnormal findings: Secondary | ICD-10-CM

## 2024-02-16 DIAGNOSIS — Z1231 Encounter for screening mammogram for malignant neoplasm of breast: Secondary | ICD-10-CM

## 2024-02-16 LAB — POCT GLYCOSYLATED HEMOGLOBIN (HGB A1C): Hemoglobin A1C: 6.5 % — AB (ref 4.0–5.6)

## 2024-02-16 MED ORDER — EMPAGLIFLOZIN 10 MG PO TABS: 10.0000 mg | ORAL_TABLET | Freq: Every morning | ORAL | 1 refills | Status: DC

## 2024-02-16 NOTE — Progress Notes (Unsigned)
 Assessment & Plan:  Routine general medical examination at a health care facility Assessment & Plan: Clinical breast exam performed today.  Patient will schedule mammogram.  Provided phone number to schedule CT lung cancer screening.  History of hysterectomy.  She does not have a cervix.  Deferred pelvic exam the absence of cervix and no complaints at this time.  Encouraged walking regimen.  Orders: -     Ambulatory Referral for Lung Cancer Scre -     B12 and Folate Panel -     CBC with Differential/Platelet -     Comprehensive metabolic panel with GFR -     Lipid panel -     TSH -     VITAMIN D  25 Hydroxy (Vit-D Deficiency, Fractures)  Controlled type 2 diabetes mellitus with complication, without long-term current use of insulin (HCC) Assessment & Plan: Lab Results  Component Value Date   HGBA1C 6.5 (A) 02/16/2024  Chronic, stable. Advised to take jardiance  10mg  every day versus every other day as she has been taking d/t h/o proteinuria.   Orders: -     POCT glycosylated hemoglobin (Hb A1C) -     Empagliflozin ; Take 1 tablet (10 mg total) by mouth every morning.  Dispense: 90 tablet; Refill: 1 -     Microalbumin / creatinine urine ratio  Encounter for screening mammogram for malignant neoplasm of breast -     3D Screening Mammogram, Left and Right; Future  Allergic sinusitis Assessment & Plan: Reassuring exam.  Afebrile.  Bilateral presentation.  No evidence to suggest recurrence of preseptal cellulitis.  Discussed trial of cool compresses, over-the-counter antihistamine.  Strict return precautions given and patient understands let me know if symptom does not resolve.   Aortic atherosclerosis (HCC) Assessment & Plan: Chronic, symptomatically stable. Lab Results  Component Value Date   LDLCALC 126 (H) 02/16/2024   LDL cholesterol greater than 70.  Will advise patient of my recommendation to increase Crestor  to 20 mg daily      Return precautions given.   Risks,  benefits, and alternatives of the medications and treatment plan prescribed today were discussed, and patient expressed understanding.   Education regarding symptom management and diagnosis given to patient on AVS either electronically or printed.  Return in about 6 weeks (around 03/29/2024).  Rollene Northern, FNP  Subjective:    Patient ID: Jaclyn Lin, female    DOB: 20-Jan-1966, 58 y.o.   MRN: [id number]  CC: Jaclyn Lin is a 58 y.o. female who presents today for physical exam and diabetic follow-up.    HPI: HPI Discussed the use of AI scribe software for clinical note transcription with the patient, who gave verbal consent to proceed.  History of Present Illness   Jaclyn Lin is a 58 year old female who presents with concerns about swelling under her left eye.  She has experienced episodic swelling under her left eye for several months.  She was previously diagnosed with preseptal cellulitis of the left lower eyelid at an urgent care 10/03/23 and treated with , which reduced the swelling but did not resolve it completely.  She denies changes in lotions or creams. H/o  seasonal allergies. Denies  tenderness, warmth of skin, fever,itchy or red eyes, sneezing, fever, or cough.  She is taking Jardiance  10 mg daily for diabetes management. Her A1c is 6.5, and she has a history of proteinuria. She has been taking Jardiance  intermittently.  She inquires about her B12 levels, as she is not  currently taking a B12 supplement but wonders if it might help with her energy levels.   She engages in physical activity by walking with a friend four days a week, although she has not done so in the past two weeks.       Colorectal Cancer Screening: UTD , Cologuard Breast Cancer Screening: Mammogram due Cervical Cancer Screening: No  longer screening for cervical cancer Partial hysterectomy, Dr. Trula; has ovaries; done due to fibriods. 03/29/20 No cervix on prior exam  .  Bone Health screening/DEXA for 65+: No increased fracture risk. Defer screening at this time.  Lung Cancer Screening: due  No family h/o AAA a.        Tetanus - UTD        Pneumococcal - Complete Hepatitis B vaccine- No h/o liver disease, IV drug use, blood transfusion, nor work around bodily fluids.  She reports she has had at [facility].  We agreed to defer screening for Hep B immunity.   Alcohol use:  beer on the weekends  Smoking/tobacco use: Nonsmoker.    Health Maintenance  Topic Date Due   Zoster (Shingles) Vaccine (1 of 2) Never done   COVID-19 Vaccine (3 - 2024-25 season) 02/23/2023   Screening for Lung Cancer  02/17/2024   Flu Shot  09/21/2024*   Pneumococcal Vaccine for age over 10 (2 of 2 - PCV) 02/15/2025*   Eye exam for diabetics  06/06/2024   Complete foot exam   07/17/2024   Hemoglobin A1C  08/18/2024   Yearly kidney function blood test for diabetes  02/15/2025   Yearly kidney health urinalysis for diabetes  02/15/2025   Mammogram  02/26/2025   Cologuard (Stool DNA test)  03/16/2026   DTaP/Tdap/Td vaccine (2 - Td or Tdap) 09/03/2028   Hepatitis C Screening  Completed   HIV Screening  Completed   HPV Vaccine  Aged Out   Meningitis B Vaccine  Aged Out   Hepatitis B Vaccine  Discontinued  *Topic was postponed. The date shown is not the original due date.    ALLERGIES: Codeine   Current Outpatient Medications on File Prior to Visit  Medication Sig Dispense Refill   albuterol  (VENTOLIN  HFA) 108 (90 Base) MCG/ACT inhaler INHALE 2 PUFFS INTO THE LUNGS EVERY 6 HOURS AS NEEDED FOR WHEEZING OR SHORTNESS OF BREATH 8.5 g 3   ALPRAZolam  (XANAX ) 0.25 MG tablet Take 1 tablet (0.25 mg total) by mouth daily as needed for anxiety or sleep. 30 tablet 1   budesonide -formoterol  (SYMBICORT ) 80-4.5 MCG/ACT inhaler Inhale 2 puffs into the lungs 2 (two) times daily. 1 each 2   montelukast  (SINGULAIR ) 10 MG tablet Take 1 tablet (10 mg total) by mouth daily. 90 tablet 3    rosuvastatin  (CRESTOR ) 10 MG tablet Take one tablet by mouth every evening. 90 tablet 3   traZODone  (DESYREL ) 50 MG tablet Take 0.5-1 tablets (25-50 mg total) by mouth at bedtime. 90 tablet 3   triamterene -hydrochlorothiazide (DYAZIDE) 37.5-25 MG capsule TAKE 1 CAPSULE BY MOUTH EVERY DAY 90 capsule 1   No current facility-administered medications on file prior to visit.    Review of Systems  Constitutional:  Negative for chills, fever and unexpected weight change.  HENT:  Negative for congestion, ear pain, sinus pressure, sore throat and trouble swallowing.   Eyes:  Negative for photophobia, discharge, redness, itching and visual disturbance.  Respiratory:  Negative for cough, shortness of breath and wheezing.   Cardiovascular:  Negative for chest pain, palpitations and leg swelling.  Gastrointestinal:  Negative for nausea and vomiting.  Musculoskeletal:  Negative for arthralgias and myalgias.  Skin:  Negative for rash.  Neurological:  Negative for headaches.  Hematological:  Negative for adenopathy.  Psychiatric/Behavioral:  Negative for confusion.       Objective:    BP 130/72   Pulse 82   Temp 98.8 F (37.1 C) (Oral)   Ht 5' 6 (1.676 m)   Wt 189 lb 9.6 oz (86 kg)   SpO2 95%   BMI 30.60 kg/m   BP Readings from Last 3 Encounters:  02/16/24 130/72  10/03/23 (!) 147/64  07/18/23 136/72   Wt Readings from Last 3 Encounters:  02/16/24 189 lb 9.6 oz (86 kg)  10/03/23 181 lb 10.5 oz (82.4 kg)  07/18/23 181 lb 9.6 oz (82.4 kg)    Physical Exam Vitals reviewed.  Constitutional:      Appearance: Normal appearance. She is well-developed.  HENT:     Head:     Comments: Bilateral puffiness under eyes. Nontender. No erythema or increased warmth  Eyes:     General: Lids are normal. No scleral icterus.       Right eye: No hordeolum.        Left eye: No hordeolum.     Extraocular Movements:     Right eye: Normal extraocular motion.     Left eye: Normal extraocular motion.      Conjunctiva/sclera: Conjunctivae normal.     Right eye: Right conjunctiva is not injected.     Left eye: Left conjunctiva is not injected.  Neck:     Thyroid: No thyroid mass or thyromegaly.  Cardiovascular:     Rate and Rhythm: Normal rate and regular rhythm.     Pulses: Normal pulses.     Heart sounds: Normal heart sounds.  Pulmonary:     Effort: Pulmonary effort is normal.     Breath sounds: Normal breath sounds. No wheezing, rhonchi or rales.  Chest:  Breasts:    Breasts are symmetrical.     Right: No inverted nipple, mass, nipple discharge, skin change or tenderness.     Left: No inverted nipple, mass, nipple discharge, skin change or tenderness.  Abdominal:     General: Bowel sounds are normal. There is no distension.     Palpations: Abdomen is soft. Abdomen is not rigid. There is no fluid wave or mass.     Tenderness: There is no abdominal tenderness. There is no guarding or rebound.  Lymphadenopathy:     Head:     Right side of head: No submental, submandibular, tonsillar, preauricular, posterior auricular or occipital adenopathy.     Left side of head: No submental, submandibular, tonsillar, preauricular, posterior auricular or occipital adenopathy.     Cervical: No cervical adenopathy.     Right cervical: No superficial, deep or posterior cervical adenopathy.    Left cervical: No superficial, deep or posterior cervical adenopathy.  Skin:    General: Skin is warm and dry.  Neurological:     Mental Status: She is alert.  Psychiatric:        Speech: Speech normal.        Behavior: Behavior normal.        Thought Content: Thought content normal.

## 2024-02-16 NOTE — Assessment & Plan Note (Signed)
 Lab Results  Component Value Date   HGBA1C 6.5 (A) 02/16/2024  Chronic, stable. Advised to take jardiance  10mg  every day versus every other day as she has been taking d/t h/o proteinuria.

## 2024-02-16 NOTE — Patient Instructions (Addendum)
 Trial claritin to aid in underneath eye swelling. Also try cold compresses   Let me know if doesn't improve  Call to make an appointment for Annual Lung cancer screen  With CT Chest:  [phone]. Let me know if any issues in doing so. Please call  and schedule your 3D mammogram and /or bone density scan as we discussed.   [facility]  ( new location in 2023)  [address], [place], [place] [address]  [place], [place]  [phone]   Health Maintenance for Postmenopausal Women Menopause is a normal process in which your ability to get pregnant comes to an end. This process happens slowly over many months or years, usually between the ages of 46 and 30. Menopause is complete when you have missed your menstrual period for 12 months. It is important to talk with your health care provider about some of the most common conditions that affect women after menopause (postmenopausal women). These include heart disease, cancer, and bone loss (osteoporosis). Adopting a healthy lifestyle and getting preventive care can help to promote your health and wellness. The actions you take can also lower your chances of developing some of these common conditions. What are the signs and symptoms of menopause? During menopause, you may have the following symptoms: Hot flashes. These can be moderate or severe. Night sweats. Decrease in sex drive. Mood swings. Headaches. Tiredness (fatigue). Irritability. Memory problems. Problems falling asleep or staying asleep. Talk with your health care provider about treatment options for your symptoms. Do I need hormone replacement therapy? Hormone replacement therapy is effective in treating symptoms that are caused by menopause, such as hot flashes and night sweats. Hormone replacement carries certain risks, especially as you become older. If you are thinking about using estrogen or estrogen with progestin, discuss the benefits and risks with  your health care provider. How can I reduce my risk for heart disease and stroke? The risk of heart disease, heart attack, and stroke increases as you age. One of the causes may be a change in the body's hormones during menopause. This can affect how your body uses dietary fats, triglycerides, and cholesterol. Heart attack and stroke are medical emergencies. There are many things that you can do to help prevent heart disease and stroke. Watch your blood pressure High blood pressure causes heart disease and increases the risk of stroke. This is more likely to develop in people who have high blood pressure readings or are overweight. Have your blood pressure checked: Every 3-5 years if you are 64-47 years of age. Every year if you are 59 years old or older. Eat a healthy diet  Eat a diet that includes plenty of vegetables, fruits, low-fat dairy products, and lean protein. Do not eat a lot of foods that are high in solid fats, added sugars, or sodium. Get regular exercise Get regular exercise. This is one of the most important things you can do for your health. Most adults should: Try to exercise for at least 150 minutes each week. The exercise should increase your heart rate and make you sweat (moderate-intensity exercise). Try to do strengthening exercises at least twice each week. Do these in addition to the moderate-intensity exercise. Spend less time sitting. Even light physical activity can be beneficial. Other tips Work with your health care provider to achieve or maintain a healthy weight. Do not use any products that contain nicotine  or tobacco. These products include cigarettes, chewing tobacco, and vaping devices, such as e-cigarettes. If  you need help quitting, ask your health care provider. Know your numbers. Ask your health care provider to check your cholesterol and your blood sugar (glucose). Continue to have your blood tested as directed by your health care provider. Do I need  screening for cancer? Depending on your health history and family history, you may need to have cancer screenings at different stages of your life. This may include screening for: Breast cancer. Cervical cancer. Lung cancer. Colorectal cancer. What is my risk for osteoporosis? After menopause, you may be at increased risk for osteoporosis. Osteoporosis is a condition in which bone destruction happens more quickly than new bone creation. To help prevent osteoporosis or the bone fractures that can happen because of osteoporosis, you may take the following actions: If you are 74-14 years old, get at least 1,000 mg of calcium  and at least 600 international units (IU) of vitamin D  per day. If you are older than age 20 but younger than age 80, get at least 1,200 mg of calcium  and at least 600 international units (IU) of vitamin D  per day. If you are older than age 48, get at least 1,200 mg of calcium  and at least 800 international units (IU) of vitamin D  per day. Smoking and drinking excessive alcohol increase the risk of osteoporosis. Eat foods that are rich in calcium  and vitamin D , and do weight-bearing exercises several times each week as directed by your health care provider. How does menopause affect my mental health? Depression may occur at any age, but it is more common as you become older. Common symptoms of depression include: Feeling depressed. Changes in sleep patterns. Changes in appetite or eating patterns. Feeling an overall lack of motivation or enjoyment of activities that you previously enjoyed. Frequent crying spells. Talk with your health care provider if you think that you are experiencing any of these symptoms. General instructions See your health care provider for regular wellness exams and vaccines. This may include: Scheduling regular health, dental, and eye exams. Getting and maintaining your vaccines. These include: Influenza vaccine. Get this vaccine each year before the  flu season begins. Pneumonia vaccine. Shingles vaccine. Tetanus, diphtheria, and pertussis (Tdap) booster vaccine. Your health care provider may also recommend other immunizations. Tell your health care provider if you have ever been abused or do not feel safe at home. Summary Menopause is a normal process in which your ability to get pregnant comes to an end. This condition causes hot flashes, night sweats, decreased interest in sex, mood swings, headaches, or lack of sleep. Treatment for this condition may include hormone replacement therapy. Take actions to keep yourself healthy, including exercising regularly, eating a healthy diet, watching your weight, and checking your blood pressure and blood sugar levels. Get screened for cancer and depression. Make sure that you are up to date with all your vaccines. This information is not intended to replace advice given to you by your health care provider. Make sure you discuss any questions you have with your health care provider. Document Revised: 10/30/2020 Document Reviewed: 10/30/2020 Elsevier Patient Education  2024 [employer].

## 2024-02-17 LAB — B12 AND FOLATE PANEL
Folate: 17.4 ng/mL (ref >3.0)
Vitamin B-12: 571 pg/mL (ref 232–1245)

## 2024-02-17 LAB — COMPREHENSIVE METABOLIC PANEL WITH GFR
ALT: 16 IU/L (ref 0–32)
AST: 17 IU/L (ref 0–40)
Albumin: 4.9 g/dL (ref 3.8–4.9)
Alkaline Phosphatase: 87 IU/L (ref 44–121)
BUN/Creatinine Ratio: 18 (ref 9–23)
BUN: 14 mg/dL (ref 6–24)
Bilirubin Total: 0.4 mg/dL (ref 0.0–1.2)
CO2: 22 mmol/L (ref 20–29)
Calcium: 10.4 mg/dL — ABNORMAL HIGH (ref 8.7–10.2)
Chloride: 101 mmol/L (ref 96–106)
Creatinine, Ser: 0.79 mg/dL (ref 0.57–1.00)
Globulin, Total: 3 g/dL (ref 1.5–4.5)
Glucose: 99 mg/dL (ref 70–99)
Potassium: 3.7 mmol/L (ref 3.5–5.2)
Sodium: 140 mmol/L (ref 134–144)
Total Protein: 7.9 g/dL (ref 6.0–8.5)
eGFR: 87 mL/min/1.73 (ref >59)

## 2024-02-17 LAB — CBC WITH DIFFERENTIAL/PLATELET
Basophils Absolute: 0.1 x10E3/uL (ref 0.0–0.2)
Basos: 1 %
EOS (ABSOLUTE): 0.1 x10E3/uL (ref 0.0–0.4)
Eos: 1 %
Hematocrit: 46.2 % (ref 34.0–46.6)
Hemoglobin: 14.4 g/dL (ref 11.1–15.9)
Immature Grans (Abs): 0 x10E3/uL (ref 0.0–0.1)
Immature Granulocytes: 0 %
Lymphocytes Absolute: 2.9 x10E3/uL (ref 0.7–3.1)
Lymphs: 39 %
MCH: 26.6 pg (ref 26.6–33.0)
MCHC: 31.2 g/dL — ABNORMAL LOW (ref 31.5–35.7)
MCV: 85 fL (ref 79–97)
Monocytes Absolute: 0.6 x10E3/uL (ref 0.1–0.9)
Monocytes: 7 %
Neutrophils Absolute: 3.9 x10E3/uL (ref 1.4–7.0)
Neutrophils: 52 %
Platelets: 276 x10E3/uL (ref 150–450)
RBC: 5.42 x10E6/uL — ABNORMAL HIGH (ref 3.77–5.28)
RDW: 14.2 % (ref 11.7–15.4)
WBC: 7.6 x10E3/uL (ref 3.4–10.8)

## 2024-02-17 LAB — LIPID PANEL
Chol/HDL Ratio: 4.2 ratio (ref 0.0–4.4)
Cholesterol, Total: 200 mg/dL — ABNORMAL HIGH (ref 100–199)
HDL: 48 mg/dL (ref >39)
LDL Chol Calc (NIH): 126 mg/dL — ABNORMAL HIGH (ref 0–99)
Triglycerides: 145 mg/dL (ref 0–149)
VLDL Cholesterol Cal: 26 mg/dL (ref 5–40)

## 2024-02-17 LAB — TSH: TSH: 1.52 u[IU]/mL (ref 0.450–4.500)

## 2024-02-17 LAB — MICROALBUMIN / CREATININE URINE RATIO
Creatinine, Urine: 29.5 mg/dL
Microalb/Creat Ratio: 107 mg/g{creat} — ABNORMAL HIGH (ref 0–29)
Microalbumin, Urine: 31.5 ug/mL

## 2024-02-17 LAB — VITAMIN D 25 HYDROXY (VIT D DEFICIENCY, FRACTURES): Vit D, 25-Hydroxy: 68.8 ng/mL (ref 30.0–100.0)

## 2024-02-19 ENCOUNTER — Encounter: Payer: Self-pay | Admitting: Family

## 2024-02-19 NOTE — Assessment & Plan Note (Signed)
 Chronic, symptomatically stable. Lab Results  Component Value Date   LDLCALC 126 (H) 02/16/2024   LDL cholesterol greater than 70.  Will advise patient of my recommendation to increase Crestor  to 20 mg daily

## 2024-02-19 NOTE — Assessment & Plan Note (Signed)
 Clinical breast exam performed today.  Patient will schedule mammogram.  Provided phone number to schedule CT lung cancer screening.  History of hysterectomy.  She does not have a cervix.  Deferred pelvic exam the absence of cervix and no complaints at this time.  Encouraged walking regimen.

## 2024-02-19 NOTE — Assessment & Plan Note (Signed)
 Reassuring exam.  Afebrile.  Bilateral presentation.  No evidence to suggest recurrence of preseptal cellulitis.  Discussed trial of cool compresses, over-the-counter antihistamine.  Strict return precautions given and patient understands let me know if symptom does not resolve.

## 2024-02-20 ENCOUNTER — Telehealth: Payer: Self-pay

## 2024-02-20 NOTE — Telephone Encounter (Signed)
 Copied from CRM [id number]. Topic: General - Other >> Feb 20, 2024  8:55 AM Macario HERO wrote: Reason for CRM: Patient called requesting her wellness screening results faxed to [facility].

## 2024-02-20 NOTE — Telephone Encounter (Signed)
 Pt has been notified and screening has been faxed

## 2024-02-26 ENCOUNTER — Ambulatory Visit: Payer: Self-pay | Admitting: Family

## 2024-02-26 NOTE — Telephone Encounter (Signed)
 Copied from CRM #[id number]. Topic: Appointments - Scheduling Inquiry for Clinic >> Feb 26, 2024  1:35 PM Robinson H wrote: Reason for CRM: Patient called to schedule an appointment to go over lab results per notes from provider first available was 9/25 and patient also has an appointment for 10/09 for a 6 weeks follow up. Provider wanted to be notified per notes when patient is scheduled but patient would like to come in sooner regarding the results, states if something is wrong she does not want to wait.  Perl [phone]  Jenate [place]azil[place], CMA, has already scheduled an appointment for patient on 03/10/2024.  Jenate spoke with patient.

## 2024-02-26 NOTE — Telephone Encounter (Signed)
 Scheduled pt for 03/10/24 pt is aware

## 2024-03-10 ENCOUNTER — Encounter: Payer: Self-pay | Admitting: Family

## 2024-03-10 ENCOUNTER — Ambulatory Visit: Admitting: Family

## 2024-03-10 VITALS — BP 128/70 | HR 83 | Temp 97.9°F | Ht 66.0 in | Wt 192.0 lb

## 2024-03-10 DIAGNOSIS — Z7984 Long term (current) use of oral hypoglycemic drugs: Secondary | ICD-10-CM

## 2024-03-10 DIAGNOSIS — R899 Unspecified abnormal finding in specimens from other organs, systems and tissues: Secondary | ICD-10-CM

## 2024-03-10 DIAGNOSIS — I10 Essential (primary) hypertension: Secondary | ICD-10-CM

## 2024-03-10 DIAGNOSIS — E1169 Type 2 diabetes mellitus with other specified complication: Secondary | ICD-10-CM

## 2024-03-10 DIAGNOSIS — G479 Sleep disorder, unspecified: Secondary | ICD-10-CM

## 2024-03-10 DIAGNOSIS — E118 Type 2 diabetes mellitus with unspecified complications: Secondary | ICD-10-CM | POA: Diagnosis not present

## 2024-03-10 DIAGNOSIS — R809 Proteinuria, unspecified: Secondary | ICD-10-CM | POA: Diagnosis not present

## 2024-03-10 DIAGNOSIS — E785 Hyperlipidemia, unspecified: Secondary | ICD-10-CM

## 2024-03-10 DIAGNOSIS — R0683 Snoring: Secondary | ICD-10-CM

## 2024-03-10 LAB — MICROALBUMIN / CREATININE URINE RATIO
Creatinine,U: 19.5 mg/dL
Microalb Creat Ratio: 83.6 mg/g — ABNORMAL HIGH (ref 0.0–30.0)
Microalb, Ur: 1.6 mg/dL (ref 0.0–1.9)

## 2024-03-10 MED ORDER — EMPAGLIFLOZIN 25 MG PO TABS: 25.0000 mg | ORAL_TABLET | Freq: Every day | ORAL | 3 refills | Status: AC

## 2024-03-10 MED ORDER — ROSUVASTATIN CALCIUM 20 MG PO TABS: 20.0000 mg | ORAL_TABLET | Freq: Every day | ORAL | 3 refills | Status: AC

## 2024-03-10 NOTE — Progress Notes (Signed)
 Assessment & Plan:  Primary hypertension Assessment & Plan: Excellent blood pressure control.  Discussed hypercalemia. She is on triamterene  hydrochlorothiazide and certainly question of hydrochlorothiazide is causing hypercalcemia.  Continue triamterene  hydrochlorothiazide 37.5-25 mg daily.  Consider ARB in the setting of diabetes.   Abnormal laboratory test -     Multiple Myeloma Panel (SPEP&IFE w/QIG) -     PTH, Intact (ICMA) and Ionized Calcium  -     IFE AND PE, RANDOM URINE  Controlled type 2 diabetes mellitus with complication, without long-term current use of insulin (HCC) -     Rosuvastatin  Calcium ; Take 1 tablet (20 mg total) by mouth daily.  Dispense: 90 tablet; Refill: 3 -     Microalbumin / creatinine urine ratio  Proteinuria, unspecified type -     Empagliflozin ; Take 1 tablet (25 mg total) by mouth daily.  Dispense: 90 tablet; Refill: 3 -     Microalbumin / creatinine urine ratio  Hyperlipidemia associated with type 2 diabetes mellitus (HCC) Assessment & Plan: Chronic, stable glycemic control.  Discussed proteinuria and LDL goal < 70.  Increase Jardiance  25 mg daily and Crestor  to 20 mg daily.  Close follow-up   Loud snoring Assessment & Plan: Discussed pulmonology consult and symptoms of OSA including nocturia, fatigue, snoring.  Encouraged her to call pulmonology to schedule her sleep test.  Discussed risk of untreated sleep apnea.   Trouble in sleeping Assessment & Plan: Discussed fatigue is likely exacerbated by poor sleep quality, and likely OSA.  Advised to start trazodone  25 mg and titrate.      Return precautions given.   Risks, benefits, and alternatives of the medications and treatment plan prescribed today were discussed, and patient expressed understanding.   Education regarding symptom management and diagnosis given to patient on AVS either electronically or printed.  No follow-ups on file.  Rollene Northern, FNP  Subjective:    Patient  ID: Jaclyn Lin, female    DOB: 1966-04-06, 58 y.o.   MRN: [id number]  CC: Jaclyn Lin is a 58 y.o. female who presents today for follow up and to discuss lab abnormalities including elevation RBC, calcium , urine protein  HPI: She complains of continued fatigue.  She is not sleeping well.She complains of trouble staying asleep.  She has not tried trazodone  Endorses snoring, nocturia Consult pulmonology 04/2023 with home sleep study order     She is compliant with Jardiance  10 mg daily.  She is compliant with Crestor  10 mg daily.  Vitamin D  68  Mammogram is due.  Allergies: Codeine  Current Outpatient Medications on File Prior to Visit  Medication Sig Dispense Refill   albuterol  (VENTOLIN  HFA) 108 (90 Base) MCG/ACT inhaler INHALE 2 PUFFS INTO THE LUNGS EVERY 6 HOURS AS NEEDED FOR WHEEZING OR SHORTNESS OF BREATH 8.5 g 3   ALPRAZolam  (XANAX ) 0.25 MG tablet Take 1 tablet (0.25 mg total) by mouth daily as needed for anxiety or sleep. 30 tablet 1   budesonide -formoterol  (SYMBICORT ) 80-4.5 MCG/ACT inhaler Inhale 2 puffs into the lungs 2 (two) times daily. 1 each 2   montelukast  (SINGULAIR ) 10 MG tablet Take 1 tablet (10 mg total) by mouth daily. 90 tablet 3   triamterene -hydrochlorothiazide (DYAZIDE) 37.5-25 MG capsule TAKE 1 CAPSULE BY MOUTH EVERY DAY 90 capsule 1   traZODone  (DESYREL ) 50 MG tablet Take 0.5-1 tablets (25-50 mg total) by mouth at bedtime. (Patient not taking: Reported on 03/10/2024) 90 tablet 3   No current facility-administered medications on file prior to visit.  Review of Systems  Constitutional:  Positive for fatigue. Negative for chills and fever.  Respiratory:  Negative for cough.   Cardiovascular:  Negative for chest pain and palpitations.  Gastrointestinal:  Negative for nausea and vomiting.      Objective:    BP 128/70   Pulse 83   Temp 97.9 F (36.6 C) (Oral)   Ht 5' 6 (1.676 m)   Wt 192 lb (87.1 kg)   SpO2 95%   BMI 30.99 kg/m   BP Readings from Last 3 Encounters:  03/10/24 128/70  02/16/24 130/72  10/03/23 (!) 147/64   Wt Readings from Last 3 Encounters:  03/10/24 192 lb (87.1 kg)  02/16/24 189 lb 9.6 oz (86 kg)  10/03/23 181 lb 10.5 oz (82.4 kg)    Physical Exam Vitals reviewed.  Constitutional:      Appearance: She is well-developed.  Eyes:     Conjunctiva/sclera: Conjunctivae normal.  Cardiovascular:     Rate and Rhythm: Normal rate and regular rhythm.     Pulses: Normal pulses.     Heart sounds: Normal heart sounds.  Pulmonary:     Effort: Pulmonary effort is normal.     Breath sounds: Normal breath sounds. No wheezing, rhonchi or rales.  Skin:    General: Skin is warm and dry.  Neurological:     Mental Status: She is alert.  Psychiatric:        Speech: Speech normal.        Behavior: Behavior normal.        Thought Content: Thought content normal.

## 2024-03-10 NOTE — Assessment & Plan Note (Addendum)
 Excellent blood pressure control.  Discussed hypercalemia. She is on triamterene  hydrochlorothiazide and certainly question of hydrochlorothiazide is causing hypercalcemia.  Continue triamterene  hydrochlorothiazide 37.5-25 mg daily.  Consider ARB in the setting of diabetes.

## 2024-03-10 NOTE — Assessment & Plan Note (Addendum)
 Chronic, stable glycemic control.  Discussed proteinuria and LDL goal < 70.  Increase Jardiance  25 mg daily and Crestor  to 20 mg daily.  Close follow-up

## 2024-03-10 NOTE — Assessment & Plan Note (Signed)
 Discussed fatigue is likely exacerbated by poor sleep quality, and likely OSA.  Advised to start trazodone  25 mg and titrate.

## 2024-03-10 NOTE — Assessment & Plan Note (Signed)
 Discussed pulmonology consult and symptoms of OSA including nocturia, fatigue, snoring.  Encouraged her to call pulmonology to schedule her sleep test.  Discussed risk of untreated sleep apnea.

## 2024-03-10 NOTE — Patient Instructions (Addendum)
 Please call pulmonology to have sleep study performed  Trial trazodone    Please call  and schedule your 3D mammogram and /or bone density scan as we discussed.   [facility]  ( new location in 2023)  [address], [place], [place] [address]  [place], [place]  [phone]   You are due for annual lung cancer screening program.    Previously the program had been managed under [facility] hematology/oncology, now it has been moved to [facility] pulmonology .    I have placed a referral to [facility] pulmonology  and their office will reach out to you to schedule your CT of your chest.  They will reach out to you annually going forward.  If you do not hear from their office in the next 1 to 2 weeks, please call [facility] Pulmonology at [phone]   So let me know if there are any issues in getting scheduled.

## 2024-03-12 ENCOUNTER — Ambulatory Visit: Payer: Self-pay | Admitting: Family

## 2024-03-12 LAB — PTH, INTACT (ICMA) AND IONIZED CALCIUM
Calcium, Ion: 5.4 mg/dL (ref 4.7–5.5)
Calcium: 10.1 mg/dL (ref 8.6–10.4)
PTH: 38 pg/mL (ref 16–77)

## 2024-03-13 LAB — MULTIPLE MYELOMA PANEL, SERUM
Albumin SerPl Elph-Mcnc: 3.7 g/dL (ref 2.9–4.4)
Albumin/Glob SerPl: 1.1 (ref 0.7–1.7)
Alpha 1: 0.2 g/dL (ref 0.0–0.4)
Alpha2 Glob SerPl Elph-Mcnc: 1 g/dL (ref 0.4–1.0)
B-Globulin SerPl Elph-Mcnc: 1.3 g/dL (ref 0.7–1.3)
Gamma Glob SerPl Elph-Mcnc: 1.2 g/dL (ref 0.4–1.8)
Globulin, Total: 3.7 g/dL (ref 2.2–3.9)
IgA/Immunoglobulin A, Serum: 297 mg/dL (ref 87–352)
IgG (Immunoglobin G), Serum: 1215 mg/dL (ref 586–1602)
IgM (Immunoglobulin M), Srm: 106 mg/dL (ref 26–217)
Total Protein: 7.4 g/dL (ref 6.0–8.5)

## 2024-03-13 LAB — IFE AND PE, RANDOM URINE
% BETA, Urine: 12 %
ALBUMIN, U: 62.8 %
ALPHA 1 URINE: 3.1 %
ALPHA-2-GLOBULIN, U: 9.1 %
GAMMA GLOBULIN URINE: 13 %
Protein, Ur: 4.7 mg/dL

## 2024-03-18 ENCOUNTER — Ambulatory Visit: Admitting: Family

## 2024-03-29 ENCOUNTER — Other Ambulatory Visit: Payer: Self-pay | Admitting: Family

## 2024-03-29 DIAGNOSIS — I10 Essential (primary) hypertension: Secondary | ICD-10-CM

## 2024-04-01 ENCOUNTER — Ambulatory Visit: Admitting: Family

## 2024-04-24 ENCOUNTER — Other Ambulatory Visit: Payer: Self-pay | Admitting: Family

## 2024-04-24 DIAGNOSIS — J309 Allergic rhinitis, unspecified: Secondary | ICD-10-CM

## 2024-04-24 DIAGNOSIS — J45909 Unspecified asthma, uncomplicated: Secondary | ICD-10-CM

## 2024-04-29 ENCOUNTER — Encounter: Payer: Self-pay | Admitting: Family

## 2024-04-29 DIAGNOSIS — E11319 Type 2 diabetes mellitus with unspecified diabetic retinopathy without macular edema: Secondary | ICD-10-CM | POA: Insufficient documentation

## 2024-04-29 LAB — OPHTHALMOLOGY REPORT-SCANNED

## 2024-04-30 ENCOUNTER — Encounter: Payer: Self-pay | Admitting: Nurse Practitioner

## 2024-04-30 ENCOUNTER — Ambulatory Visit: Admitting: Nurse Practitioner

## 2024-04-30 VITALS — BP 142/88 | HR 67 | Temp 97.7°F | Ht 66.0 in | Wt 186.4 lb

## 2024-04-30 DIAGNOSIS — I10 Essential (primary) hypertension: Secondary | ICD-10-CM

## 2024-04-30 DIAGNOSIS — R809 Proteinuria, unspecified: Secondary | ICD-10-CM

## 2024-04-30 DIAGNOSIS — R350 Frequency of micturition: Secondary | ICD-10-CM

## 2024-04-30 LAB — POC URINALSYSI DIPSTICK (AUTOMATED)
Bilirubin, UA: NEGATIVE
Glucose, UA: POSITIVE — AB
Leukocytes, UA: NEGATIVE
Nitrite, UA: NEGATIVE
Protein, UA: POSITIVE — AB
Spec Grav, UA: 1.015 (ref 1.010–1.025)
Urobilinogen, UA: 0.2 U/dL
pH, UA: 6 (ref 5.0–8.0)

## 2024-04-30 LAB — URINALYSIS, ROUTINE W REFLEX MICROSCOPIC
Bilirubin Urine: NEGATIVE
Ketones, ur: NEGATIVE
Leukocytes,Ua: NEGATIVE
Nitrite: NEGATIVE
Specific Gravity, Urine: 1.015 (ref 1.000–1.030)
Urine Glucose: >=1000 — AB
Urobilinogen, UA: 0.2 (ref 0.0–1.0)
pH: 6 (ref 5.0–8.0)

## 2024-04-30 NOTE — Progress Notes (Signed)
 Leron Glance, NP-C Phone: [phone]  Jaclyn Lin is a 58 y.o. female who presents today for UTI symptoms.   Discussed the use of AI scribe software for clinical note transcription with the patient, who gave verbal consent to proceed.  History of Present Illness   Jaclyn Lin is a 58 year old female with diabetes and hypertension who presents with urinary symptoms.  She experiences urinary symptoms characterized by a sensation of pelvic pressure, increased frequency, and urgency, particularly when sitting at her desk. No visible hematuria, dysuria, abnormal discharge, fever, or abdominal pain. She has lower back pain on both sides, initially attributed to her sleeping position.  She was informed that her urinalysis showed small amounts of blood, protein, and glucose. She is concerned about proteinuria and its potential impact on her health, particularly her eyes.  She reports her A1c is approximately 6.3 and that it was below 7 at her last check. She takes Jardiance  for diabetes and triamterene  hydrochlorothiazide for hypertension. She attributes current blood pressure elevation to stress from work and rushing to the appointment.  She drinks a couple of beers on the weekend, typically light beer, and is unsure about the impact of her alcohol consumption on her urinary protein levels.      Social History   Tobacco Use  Smoking Status Every Day   Current packs/day: 0.75   Average packs/day: 0.8 packs/day for 40.0 years (30.0 ttl pk-yrs)   Types: Cigarettes   Start date: 02/11/2016  Smokeless Tobacco Never  Tobacco Comments   smoking every couple of days    Current Outpatient Medications on File Prior to Visit  Medication Sig Dispense Refill   albuterol  (VENTOLIN  HFA) 108 (90 Base) MCG/ACT inhaler INHALE 2 PUFFS INTO THE LUNGS EVERY 6 HOURS AS NEEDED FOR WHEEZING OR SHORTNESS OF BREATH 8.5 g 3   ALPRAZolam  (XANAX ) 0.25 MG tablet Take 1 tablet (0.25 mg total) by  mouth daily as needed for anxiety or sleep. 30 tablet 1   budesonide -formoterol  (SYMBICORT ) 80-4.5 MCG/ACT inhaler Inhale 2 puffs into the lungs 2 (two) times daily. 1 each 2   empagliflozin  (JARDIANCE ) 25 MG TABS tablet Take 1 tablet (25 mg total) by mouth daily. 90 tablet 3   montelukast  (SINGULAIR ) 10 MG tablet TAKE 1 TABLET(10 MG) BY MOUTH DAILY 90 tablet 3   rosuvastatin  (CRESTOR ) 20 MG tablet Take 1 tablet (20 mg total) by mouth daily. 90 tablet 3   triamterene -hydrochlorothiazide (DYAZIDE) 37.5-25 MG capsule TAKE 1 CAPSULE BY MOUTH EVERY DAY 90 capsule 1   traZODone  (DESYREL ) 50 MG tablet Take 0.5-1 tablets (25-50 mg total) by mouth at bedtime. (Patient not taking: Reported on 04/30/2024) 90 tablet 3   No current facility-administered medications on file prior to visit.     ROS see history of present illness  Objective  Physical Exam Vitals:   04/30/24 1135  BP: (!) 142/88  Pulse: 67  Temp: 97.7 F (36.5 C)  SpO2: 98%    BP Readings from Last 3 Encounters:  04/30/24 (!) 142/88  03/10/24 128/70  02/16/24 130/72   Wt Readings from Last 3 Encounters:  04/30/24 186 lb 6.4 oz (84.6 kg)  03/10/24 192 lb (87.1 kg)  02/16/24 189 lb 9.6 oz (86 kg)    Physical Exam Constitutional:      General: She is not in acute distress.    Appearance: Normal appearance.  HENT:     Head: Normocephalic.  Cardiovascular:     Rate and Rhythm: Normal rate  and regular rhythm.     Heart sounds: Normal heart sounds.  Pulmonary:     Effort: Pulmonary effort is normal.     Breath sounds: Normal breath sounds.  Abdominal:     General: Abdomen is flat. Bowel sounds are normal.     Palpations: Abdomen is soft.     Tenderness: There is no abdominal tenderness.  Skin:    General: Skin is warm and dry.  Neurological:     General: No focal deficit present.     Mental Status: She is alert.  Psychiatric:        Mood and Affect: Mood normal.        Behavior: Behavior normal.       Assessment/Plan: Please see individual problem list.  Urinary frequency Assessment & Plan: She reports pressure, frequency, and urgency without dysuria, hematuria, or abnormal discharge. Urinalysis shows small blood, protein, and glucose, but no nitrites or leukocytes. Microscopic and culture pending. We will contact her with the results. Agreeable to hold on antibiotics or further work up until culture results. Advised to monitor symptoms and seek medical attention if symptoms worsen. Encouraged increased fluid intake and advised on proper hygiene practices.  Orders: -     POCT Urinalysis Dipstick (Automated) -     Urinalysis, Routine w reflex microscopic -     Urine Culture  Proteinuria, unspecified type Assessment & Plan: Proteinuria is likely related to hypertension and diabetes, however her diabetes is well controlled. Blood pressure elevation may contribute to proteinuria. Current medication includes Jardiance , which is appropriate for managing proteinuria. Continue current medications, including Jardiance . Monitor blood pressure and consider adjusting blood pressure medication if hypertension persists. Follow up with PCP.    Primary hypertension Assessment & Plan: Blood pressure is elevated, possibly due to situational stress. Previously well controlled. Current medication includes triamterene  hydrochlorothiazide. Blood pressure control is crucial to manage proteinuria and prevent further complications. Continue current antihypertensive medication regimen and monitor blood pressure regularly. Follow up with PCP.        Return if symptoms worsen or fail to improve.   Leron Glance, NP-C [facility]

## 2024-04-30 NOTE — Assessment & Plan Note (Signed)
 Proteinuria is likely related to hypertension and diabetes, however her diabetes is well controlled. Blood pressure elevation may contribute to proteinuria. Current medication includes Jardiance , which is appropriate for managing proteinuria. Continue current medications, including Jardiance . Monitor blood pressure and consider adjusting blood pressure medication if hypertension persists. Follow up with PCP.

## 2024-04-30 NOTE — Assessment & Plan Note (Signed)
 She reports pressure, frequency, and urgency without dysuria, hematuria, or abnormal discharge. Urinalysis shows small blood, protein, and glucose, but no nitrites or leukocytes. Microscopic and culture pending. We will contact her with the results. Agreeable to hold on antibiotics or further work up until culture results. Advised to monitor symptoms and seek medical attention if symptoms worsen. Encouraged increased fluid intake and advised on proper hygiene practices.

## 2024-04-30 NOTE — Assessment & Plan Note (Signed)
 Blood pressure is elevated, possibly due to situational stress. Previously well controlled. Current medication includes triamterene  hydrochlorothiazide. Blood pressure control is crucial to manage proteinuria and prevent further complications. Continue current antihypertensive medication regimen and monitor blood pressure regularly. Follow up with PCP.

## 2024-05-01 LAB — URINE CULTURE
MICRO NUMBER:: 17206755
Result:: NO GROWTH
SPECIMEN QUALITY:: ADEQUATE

## 2024-05-04 ENCOUNTER — Ambulatory Visit: Payer: Self-pay | Admitting: Nurse Practitioner

## 2024-05-04 DIAGNOSIS — R350 Frequency of micturition: Secondary | ICD-10-CM

## 2024-05-06 ENCOUNTER — Other Ambulatory Visit (INDEPENDENT_AMBULATORY_CARE_PROVIDER_SITE_OTHER)

## 2024-05-06 ENCOUNTER — Ambulatory Visit: Payer: Self-pay | Admitting: Nurse Practitioner

## 2024-05-06 DIAGNOSIS — R350 Frequency of micturition: Secondary | ICD-10-CM

## 2024-05-06 LAB — URINALYSIS, ROUTINE W REFLEX MICROSCOPIC
Bilirubin Urine: NEGATIVE
Hgb urine dipstick: NEGATIVE
Ketones, ur: NEGATIVE
Leukocytes,Ua: NEGATIVE
Nitrite: NEGATIVE
Specific Gravity, Urine: <=1.005 — AB (ref 1.000–1.030)
Total Protein, Urine: NEGATIVE
Urine Glucose: >=1000 — AB
Urobilinogen, UA: 0.2 (ref 0.0–1.0)
pH: 7 (ref 5.0–8.0)

## 2024-06-22 ENCOUNTER — Other Ambulatory Visit (HOSPITAL_COMMUNITY): Payer: Self-pay
